# Patient Record
Sex: Male | Born: 1975 | Race: Black or African American | Hispanic: No | Marital: Married | Smoking: Current every day smoker
Health system: Southern US, Community
[De-identification: ages and names within clinical notes are randomized; demographics above are authoritative.]

## PROBLEM LIST (undated history)

## (undated) DIAGNOSIS — Z789 Other specified health status: Secondary | ICD-10-CM

## (undated) DIAGNOSIS — Z720 Tobacco use: Secondary | ICD-10-CM

## (undated) DIAGNOSIS — I517 Cardiomegaly: Secondary | ICD-10-CM

## (undated) DIAGNOSIS — I10 Essential (primary) hypertension: Secondary | ICD-10-CM

## (undated) DIAGNOSIS — Z9114 Patient's other noncompliance with medication regimen: Secondary | ICD-10-CM

## (undated) DIAGNOSIS — Z91148 Patient's other noncompliance with medication regimen for other reason: Secondary | ICD-10-CM

## (undated) DIAGNOSIS — I2510 Atherosclerotic heart disease of native coronary artery without angina pectoris: Secondary | ICD-10-CM

## (undated) DIAGNOSIS — E785 Hyperlipidemia, unspecified: Secondary | ICD-10-CM

## (undated) DIAGNOSIS — I25111 Atherosclerotic heart disease of native coronary artery with angina pectoris with documented spasm: Secondary | ICD-10-CM

## (undated) DIAGNOSIS — I214 Non-ST elevation (NSTEMI) myocardial infarction: Secondary | ICD-10-CM

## (undated) HISTORY — DX: Cardiomegaly: I51.7

## (undated) HISTORY — DX: Patient's other noncompliance with medication regimen: Z91.14

## (undated) HISTORY — DX: Other specified health status: Z78.9

## (undated) HISTORY — DX: Patient's other noncompliance with medication regimen for other reason: Z91.148

## (undated) HISTORY — DX: Tobacco use: Z72.0

## (undated) HISTORY — DX: Atherosclerotic heart disease of native coronary artery without angina pectoris: I25.10

## (undated) HISTORY — DX: Hyperlipidemia, unspecified: E78.5

## (undated) HISTORY — DX: Essential (primary) hypertension: I10

## (undated) SURGERY — LEFT HEART CATH AND CORONARY ANGIOGRAPHY

---

## 2013-11-04 ENCOUNTER — Emergency Department: Payer: Self-pay | Admitting: Emergency Medicine

## 2013-11-04 LAB — CBC
HCT: 48.9 % (ref 40.0–52.0)
HGB: 16.3 g/dL (ref 13.0–18.0)
MCH: 31.5 pg (ref 26.0–34.0)
MCHC: 33.3 g/dL (ref 32.0–36.0)
MCV: 95 fL (ref 80–100)
Platelet: 252 10*3/uL (ref 150–440)
RBC: 5.16 10*6/uL (ref 4.40–5.90)
RDW: 13.6 % (ref 11.5–14.5)
WBC: 5.7 10*3/uL (ref 3.8–10.6)

## 2013-11-04 LAB — BASIC METABOLIC PANEL
Anion Gap: 7 (ref 7–16)
BUN: 12 mg/dL (ref 7–18)
CALCIUM: 9.1 mg/dL (ref 8.5–10.1)
CHLORIDE: 103 mmol/L (ref 98–107)
Co2: 25 mmol/L (ref 21–32)
Creatinine: 1.13 mg/dL (ref 0.60–1.30)
EGFR (African American): >60
EGFR (Non-African Amer.): >60
Glucose: 90 mg/dL (ref 65–99)
OSMOLALITY: 269 (ref 275–301)
Potassium: 3.8 mmol/L (ref 3.5–5.1)
Sodium: 135 mmol/L — ABNORMAL LOW (ref 136–145)

## 2013-11-04 LAB — TROPONIN I: Troponin-I: <0.02 ng/mL

## 2014-11-25 ENCOUNTER — Encounter: Payer: Self-pay | Admitting: Family Medicine

## 2014-11-25 ENCOUNTER — Ambulatory Visit (INDEPENDENT_AMBULATORY_CARE_PROVIDER_SITE_OTHER): Payer: Self-pay | Admitting: Family Medicine

## 2014-11-25 VITALS — BP 176/112 | HR 112 | Temp 97.7°F | Resp 18 | Ht 71.5 in | Wt 274.5 lb

## 2014-11-25 DIAGNOSIS — IMO0001 Reserved for inherently not codable concepts without codable children: Secondary | ICD-10-CM

## 2014-11-25 DIAGNOSIS — I10 Essential (primary) hypertension: Secondary | ICD-10-CM

## 2014-11-25 DIAGNOSIS — Z131 Encounter for screening for diabetes mellitus: Secondary | ICD-10-CM

## 2014-11-25 DIAGNOSIS — E782 Mixed hyperlipidemia: Secondary | ICD-10-CM | POA: Insufficient documentation

## 2014-11-25 DIAGNOSIS — E785 Hyperlipidemia, unspecified: Secondary | ICD-10-CM

## 2014-11-25 MED ORDER — LOSARTAN POTASSIUM 100 MG PO TABS: 100.0000 mg | ORAL_TABLET | Freq: Every day | ORAL | Status: DC

## 2014-11-25 MED ORDER — ATORVASTATIN CALCIUM 20 MG PO TABS: 20.0000 mg | ORAL_TABLET | Freq: Every day | ORAL | Status: DC

## 2014-11-25 MED ORDER — HYDROCHLOROTHIAZIDE 25 MG PO TABS: 25.0000 mg | ORAL_TABLET | Freq: Every day | ORAL | Status: DC

## 2014-11-25 NOTE — Patient Instructions (Signed)
DASH Eating Plan °DASH stands for "Dietary Approaches to Stop Hypertension." The DASH eating plan is a healthy eating plan that has been shown to reduce high blood pressure (hypertension). Additional health benefits may include reducing the risk of type 2 diabetes mellitus, heart disease, and stroke. The DASH eating plan may also help with weight loss. °WHAT DO I NEED TO KNOW ABOUT THE DASH EATING PLAN? °For the DASH eating plan, you will follow these general guidelines: °· Choose foods with a percent daily value for sodium of less than 5% (as listed on the food label). °· Use salt-free seasonings or herbs instead of table salt or sea salt. °· Check with your health care provider or pharmacist before using salt substitutes. °· Eat lower-sodium products, often labeled as "lower sodium" or "no salt added." °· Eat fresh foods. °· Eat more vegetables, fruits, and low-fat dairy products. °· Choose whole grains. Look for the word "whole" as the first word in the ingredient list. °· Choose fish and skinless chicken or turkey more often than red meat. Limit fish, poultry, and meat to 6 oz (170 g) each day. °· Limit sweets, desserts, sugars, and sugary drinks. °· Choose heart-healthy fats. °· Limit cheese to 1 oz (28 g) per day. °· Eat more home-cooked food and less restaurant, buffet, and fast food. °· Limit fried foods. °· Cook foods using methods other than frying. °· Limit canned vegetables. If you do use them, rinse them well to decrease the sodium. °· When eating at a restaurant, ask that your food be prepared with less salt, or no salt if possible. °WHAT FOODS CAN I EAT? °Seek help from a dietitian for individual calorie needs. °Grains °Whole grain or whole wheat bread. Brown rice. Whole grain or whole wheat pasta. Quinoa, bulgur, and whole grain cereals. Low-sodium cereals. Corn or whole wheat flour tortillas. Whole grain cornbread. Whole grain crackers. Low-sodium crackers. °Vegetables °Fresh or frozen vegetables  (raw, steamed, roasted, or grilled). Low-sodium or reduced-sodium tomato and vegetable juices. Low-sodium or reduced-sodium tomato sauce and paste. Low-sodium or reduced-sodium canned vegetables.  °Fruits °All fresh, canned (in natural juice), or frozen fruits. °Meat and Other Protein Products °Ground beef (85% or leaner), grass-fed beef, or beef trimmed of fat. Skinless chicken or turkey. Ground chicken or turkey. Pork trimmed of fat. All fish and seafood. Eggs. Dried beans, peas, or lentils. Unsalted nuts and seeds. Unsalted canned beans. °Dairy °Low-fat dairy products, such as skim or 1% milk, 2% or reduced-fat cheeses, low-fat ricotta or cottage cheese, or plain low-fat yogurt. Low-sodium or reduced-sodium cheeses. °Fats and Oils °Tub margarines without trans fats. Light or reduced-fat mayonnaise and salad dressings (reduced sodium). Avocado. Safflower, olive, or canola oils. Natural peanut or almond butter. °Other °Unsalted popcorn and pretzels. °The items listed above may not be a complete list of recommended foods or beverages. Contact your dietitian for more options. °WHAT FOODS ARE NOT RECOMMENDED? °Grains °White bread. White pasta. White rice. Refined cornbread. Bagels and croissants. Crackers that contain trans fat. °Vegetables °Creamed or fried vegetables. Vegetables in a cheese sauce. Regular canned vegetables. Regular canned tomato sauce and paste. Regular tomato and vegetable juices. °Fruits °Dried fruits. Canned fruit in light or heavy syrup. Fruit juice. °Meat and Other Protein Products °Fatty cuts of meat. Ribs, chicken wings, bacon, sausage, bologna, salami, chitterlings, fatback, hot dogs, bratwurst, and packaged luncheon meats. Salted nuts and seeds. Canned beans with salt. °Dairy °Whole or 2% milk, cream, half-and-half, and cream cheese. Whole-fat or sweetened yogurt. Full-fat   cheeses or blue cheese. Nondairy creamers and whipped toppings. Processed cheese, cheese spreads, or cheese  curds. °Condiments °Onion and garlic salt, seasoned salt, table salt, and sea salt. Canned and packaged gravies. Worcestershire sauce. Tartar sauce. Barbecue sauce. Teriyaki sauce. Soy sauce, including reduced sodium. Steak sauce. Fish sauce. Oyster sauce. Cocktail sauce. Horseradish. Ketchup and mustard. Meat flavorings and tenderizers. Bouillon cubes. Hot sauce. Tabasco sauce. Marinades. Taco seasonings. Relishes. °Fats and Oils °Butter, stick margarine, lard, shortening, ghee, and bacon fat. Coconut, palm kernel, or palm oils. Regular salad dressings. °Other °Pickles and olives. Salted popcorn and pretzels. °The items listed above may not be a complete list of foods and beverages to avoid. Contact your dietitian for more information. °WHERE CAN I FIND MORE INFORMATION? °National Heart, Lung, and [employer] Institute: www.nhlbi.nih.gov/health/health-topics/topics/dash/ °Document Released: 03/28/2011 Document Revised: 08/23/2013 Document Reviewed: 02/10/2013 °ExitCare® Patient Information ©2015 [employer]. This information is not intended to replace advice given to you by your health care provider. Make sure you discuss any questions you have with your health care provider. ° °

## 2014-11-25 NOTE — Progress Notes (Signed)
Name: Jordan Maxwell   MRN: [id number]    DOB: 1975/10/21   Date:11/25/2014       Progress Note  Subjective  Chief Complaint  Chief Complaint  Patient presents with  . Establish Care    patient has not had any blood work in 2 months.  . Hypertension    patient has been without his medication for over a month  . Hyperlipidemia    HPI  Patient is here for routine follow up of Hypertension. First diagnosed with hypertension several years ago. Current anti-hypertension medication regimen includes dietary modification, weight management and supposed to be on Losartan 100mg  daily, HCTZ 25mg  daily and Simvastatin 20mg  daily but has been out of medication for over 1 month now.  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  He is not checking his blood pressures outside of physician office and notes no symptoms. Associated symptoms do not include headache, dizziness, nausea, lower extremity swelling, shortness of breath, chest pain, numbness.   Past Medical History  Diagnosis Date  . Hypertension   . Hyperlipidemia     History reviewed. No pertinent past surgical history.  Family History  Problem Relation Age of Onset  . Hypertension Mother   . Diabetes Mother     History   Social History  . Marital Status: Single    Spouse Name: N/A  . Number of Children: 4  . Years of Education: N/A   Occupational History  . Truck Education officer, community   Social History Main Topics  . Smoking status: Current Every Day Smoker    Types: Cigarettes  . Smokeless tobacco: Not on file  . Alcohol Use: 0.0 oz/week    0 Standard drinks or equivalent per week     Comment: ocassionally  . Drug Use: No  . Sexual Activity:    Partners: Female    Copy: None   Other Topics Concern  . Not on file   Social History Narrative  . No narrative on file    No current  outpatient prescriptions on file.  Allergies  Allergen Reactions  . Eggs Or Egg-Derived Products Anaphylaxis    Incident happened as a child     ROS  CONSTITUTIONAL: No significant weight changes, fever, chills, weakness or fatigue.  HEENT:  - Eyes: No visual changes.  - Ears: No auditory changes. No pain.  - Nose: No sneezing, congestion, runny nose. - Throat: No sore throat. No changes in swallowing. SKIN: No rash or itching.  CARDIOVASCULAR: No chest pain, chest pressure or chest discomfort. No palpitations or edema.  RESPIRATORY: No shortness of breath, cough or sputum.  GASTROINTESTINAL: No anorexia, nausea, vomiting. No changes in bowel habits. No abdominal pain or blood.  GENITOURINARY: No dysuria. No frequency. No discharge.  NEUROLOGICAL: No headache, dizziness, syncope, paralysis, ataxia, numbness or tingling in the extremities. No memory changes. No change in bowel or bladder control.  MUSCULOSKELETAL: No joint pain. No muscle pain. HEMATOLOGIC: No anemia, bleeding or bruising.  LYMPHATICS: No enlarged lymph nodes.  PSYCHIATRIC: No change in mood. No change in sleep pattern.  ENDOCRINOLOGIC: No reports of sweating, cold or heat intolerance. No polyuria or polydipsia.   Objective  Filed Vitals:   11/25/14 1336  BP: 176/112  Pulse: 112  Temp: 97.7 F (36.5 C)  TempSrc: Oral  Resp: 18  Height: 5' 11.5" (1.816 m)  Weight: 274 lb 8 oz (124.512 kg)  SpO2: 97%   Body mass index is 37.76 kg/(m^2).  Physical Exam  Constitutional: Patient is obese and well-nourished. In no distress.  HEENT:  - Head: Normocephalic and atraumatic.  - Ears: Bilateral TMs gray, no erythema or effusion - Nose: Nasal mucosa moist - Mouth/Throat: Oropharynx is clear and moist. No tonsillar hypertrophy or erythema. No post nasal drainage.  - Eyes: Conjunctival yellowing and vascular congestion, EOM movements normal. PERRLA. No scleral icterus.  Neck: Normal range of motion. Neck supple.  No JVD present. No thyromegaly present.  Cardiovascular: Normal rate, regular rhythm and normal heart sounds.  No murmur heard.  Pulmonary/Chest: Effort normal and breath sounds normal. No respiratory distress. Musculoskeletal: Normal range of motion bilateral UE and LE, no joint effusions. Peripheral vascular: Bilateral LE no edema. Neurological: CN II-XII grossly intact with no focal deficits. Alert and oriented to person, place, and time. Coordination, balance, strength, speech and gait are normal.  Skin: Skin is warm and dry. No rash noted. No erythema.  Psychiatric: Patient has a normal mood and affect. Behavior is normal in office today. Judgment and thought content normal in office today.   Assessment & Plan  1. Hypertension goal BP (blood pressure) < 140/90 The patient has been counseled on the importance of the proper and regular use of their medication(s) so as to achieve treatment goals. Patient verbalizes understanding that nonadhering to recommended treatment plan can result in adverse events.  Restarted him on his medications but instructed him to start with 1/2 tablet of Losartan  for few days then whole tablet then add on 1/2 tablet of HCTZ then whole tablet to avoid dropping his pressures too quickly. Last time he was out of medications and he was put back on them he had erectile dysfunction.   - CBC with Differential/Platelet - Comprehensive metabolic panel - Hemoglobin A1c - TSH - losartan (COZAAR) 100 MG tablet; Take 1 tablet (100 mg total) by mouth daily.  Dispense: 30 tablet; Refill: 5 - hydrochlorothiazide (HYDRODIURIL) 25 MG tablet; Take 1 tablet (25 mg total) by mouth daily.  Dispense: 30 tablet; Refill: 5  2. Hyperlipidemia LDL goal <100 Instructed patient to take his statin before bedtime. Switching him from simvastatin to atorvastatin.  - Hemoglobin A1c - Lipid panel - atorvastatin (LIPITOR) 20 MG tablet; Take 1 tablet (20 mg total) by mouth at bedtime.   Dispense: 30 tablet; Refill: 5  3. Obesity, Class II, BMI 35-39.9, with comorbidity Poor diet and lifestyle.  Comorbidities include HTN, HLD and possible hyperglycemia. Would benefit from weight loss.  - Hemoglobin A1c  4. Screening for diabetes mellitus (DM) At risk for DMII due to uncontroled HTN, HLD and Obesity, will check Hba1c.  - Hemoglobin A1c

## 2015-02-24 ENCOUNTER — Ambulatory Visit: Payer: Self-pay | Admitting: Family Medicine

## 2015-07-30 ENCOUNTER — Inpatient Hospital Stay
Admission: EM | Admit: 2015-07-30 | Discharge: 2015-08-01 | DRG: 247 | Disposition: A | Discharge status: Home / Self Care | Payer: Self-pay | Attending: Internal Medicine | Admitting: Internal Medicine

## 2015-07-30 ENCOUNTER — Encounter: Payer: Self-pay | Admitting: *Deleted

## 2015-07-30 ENCOUNTER — Inpatient Hospital Stay
Admit: 2015-07-30 | Discharge: 2015-07-30 | Disposition: A | Discharge status: Home / Self Care | Payer: Self-pay | Attending: Internal Medicine | Admitting: Internal Medicine

## 2015-07-30 ENCOUNTER — Emergency Department: Payer: Self-pay

## 2015-07-30 DIAGNOSIS — Z9114 Patient's other noncompliance with medication regimen: Secondary | ICD-10-CM

## 2015-07-30 DIAGNOSIS — E785 Hyperlipidemia, unspecified: Secondary | ICD-10-CM | POA: Diagnosis present

## 2015-07-30 DIAGNOSIS — F17200 Nicotine dependence, unspecified, uncomplicated: Secondary | ICD-10-CM | POA: Insufficient documentation

## 2015-07-30 DIAGNOSIS — I252 Old myocardial infarction: Secondary | ICD-10-CM | POA: Diagnosis present

## 2015-07-30 DIAGNOSIS — I10 Essential (primary) hypertension: Secondary | ICD-10-CM

## 2015-07-30 DIAGNOSIS — R7989 Other specified abnormal findings of blood chemistry: Secondary | ICD-10-CM

## 2015-07-30 DIAGNOSIS — I214 Non-ST elevation (NSTEMI) myocardial infarction: Principal | ICD-10-CM

## 2015-07-30 DIAGNOSIS — Z833 Family history of diabetes mellitus: Secondary | ICD-10-CM

## 2015-07-30 DIAGNOSIS — Z6836 Body mass index (BMI) 36.0-36.9, adult: Secondary | ICD-10-CM

## 2015-07-30 DIAGNOSIS — I2510 Atherosclerotic heart disease of native coronary artery without angina pectoris: Secondary | ICD-10-CM | POA: Insufficient documentation

## 2015-07-30 DIAGNOSIS — I25111 Atherosclerotic heart disease of native coronary artery with angina pectoris with documented spasm: Secondary | ICD-10-CM | POA: Diagnosis present

## 2015-07-30 DIAGNOSIS — E782 Mixed hyperlipidemia: Secondary | ICD-10-CM | POA: Diagnosis present

## 2015-07-30 DIAGNOSIS — E1169 Type 2 diabetes mellitus with other specified complication: Secondary | ICD-10-CM | POA: Diagnosis present

## 2015-07-30 DIAGNOSIS — F17210 Nicotine dependence, cigarettes, uncomplicated: Secondary | ICD-10-CM | POA: Diagnosis present

## 2015-07-30 DIAGNOSIS — Z8249 Family history of ischemic heart disease and other diseases of the circulatory system: Secondary | ICD-10-CM

## 2015-07-30 HISTORY — DX: Atherosclerotic heart disease of native coronary artery with angina pectoris with documented spasm: I25.111

## 2015-07-30 HISTORY — DX: Non-ST elevation (NSTEMI) myocardial infarction: I21.4

## 2015-07-30 LAB — COMPREHENSIVE METABOLIC PANEL
ALT: 22 U/L (ref 17–63)
ANION GAP: 7 (ref 5–15)
AST: 27 U/L (ref 15–41)
Albumin: 4 g/dL (ref 3.5–5.0)
Alkaline Phosphatase: 55 U/L (ref 38–126)
BUN: 14 mg/dL (ref 6–20)
CO2: 22 mmol/L (ref 22–32)
Calcium: 9 mg/dL (ref 8.9–10.3)
Chloride: 105 mmol/L (ref 101–111)
Creatinine, Ser: 1.09 mg/dL (ref 0.61–1.24)
GFR calc Af Amer: >60 mL/min (ref >60)
GFR calc non Af Amer: >60 mL/min (ref >60)
Glucose, Bld: 183 mg/dL — ABNORMAL HIGH (ref 65–99)
POTASSIUM: 4 mmol/L (ref 3.5–5.1)
SODIUM: 134 mmol/L — AB (ref 135–145)
Total Bilirubin: 0.6 mg/dL (ref 0.3–1.2)
Total Protein: 6.9 g/dL (ref 6.5–8.1)

## 2015-07-30 LAB — CBC
HCT: 49.4 % (ref 40.0–52.0)
HEMOGLOBIN: 16.8 g/dL (ref 13.0–18.0)
MCH: 31 pg (ref 26.0–34.0)
MCHC: 34 g/dL (ref 32.0–36.0)
MCV: 91.1 fL (ref 80.0–100.0)
Platelets: 235 10*3/uL (ref 150–440)
RBC: 5.42 MIL/uL (ref 4.40–5.90)
RDW: 13.9 % (ref 11.5–14.5)
WBC: 5.2 10*3/uL (ref 3.8–10.6)

## 2015-07-30 LAB — TROPONIN I
TROPONIN I: 0.22 ng/mL — AB (ref <0.031)
Troponin I: 0.15 ng/mL — ABNORMAL HIGH (ref <0.031)
Troponin I: 0.27 ng/mL — ABNORMAL HIGH (ref <0.031)

## 2015-07-30 LAB — HEMOGLOBIN A1C: HEMOGLOBIN A1C: 5.9 % (ref 4.0–6.0)

## 2015-07-30 LAB — URINE DRUG SCREEN, QUALITATIVE (ARMC ONLY)
Amphetamines, Ur Screen: NOT DETECTED
BARBITURATES, UR SCREEN: NOT DETECTED
BENZODIAZEPINE, UR SCRN: NOT DETECTED
Cannabinoid 50 Ng, Ur ~~LOC~~: NOT DETECTED
Cocaine Metabolite,Ur ~~LOC~~: NOT DETECTED
MDMA (Ecstasy)Ur Screen: NOT DETECTED
METHADONE SCREEN, URINE: NOT DETECTED
Opiate, Ur Screen: NOT DETECTED
Phencyclidine (PCP) Ur S: NOT DETECTED
TRICYCLIC, UR SCREEN: NOT DETECTED

## 2015-07-30 LAB — PROTIME-INR
INR: 0.94
PROTHROMBIN TIME: 12.8 s (ref 11.4–15.0)

## 2015-07-30 LAB — APTT: APTT: 28 s (ref 24–36)

## 2015-07-30 LAB — HEPARIN LEVEL (UNFRACTIONATED): HEPARIN UNFRACTIONATED: 0.28 [IU]/mL — AB (ref 0.30–0.70)

## 2015-07-30 MED ORDER — ONDANSETRON HCL 4 MG PO TABS: 4.0000 mg | ORAL_TABLET | Freq: Four times a day (QID) | ORAL | Status: DC | PRN

## 2015-07-30 MED ORDER — METOPROLOL TARTRATE 25 MG PO TABS
12.5000 mg | ORAL_TABLET | Freq: Two times a day (BID) | ORAL | Status: DC
  Administered 2015-07-30 – 2015-07-31 (×3): 12.5 mg via ORAL
  Filled 2015-07-30 (×3): qty 1

## 2015-07-30 MED ORDER — ASPIRIN 81 MG PO CHEW
324.0000 mg | CHEWABLE_TABLET | Freq: Once | ORAL | Status: AC
  Administered 2015-07-30: 324 mg via ORAL
  Filled 2015-07-30: qty 4

## 2015-07-30 MED ORDER — CLOPIDOGREL BISULFATE 75 MG PO TABS
75.0000 mg | ORAL_TABLET | Freq: Every day | ORAL | Status: DC
  Administered 2015-07-31: 75 mg via ORAL
  Filled 2015-07-30: qty 1

## 2015-07-30 MED ORDER — CLOPIDOGREL BISULFATE 75 MG PO TABS
300.0000 mg | ORAL_TABLET | Freq: Once | ORAL | Status: AC
  Administered 2015-07-30: 300 mg via ORAL
  Filled 2015-07-30: qty 4

## 2015-07-30 MED ORDER — HEPARIN BOLUS VIA INFUSION
1550.0000 [IU] | Freq: Once | INTRAVENOUS | Status: AC
  Administered 2015-07-30: 1550 [IU] via INTRAVENOUS
  Filled 2015-07-30: qty 1550

## 2015-07-30 MED ORDER — SODIUM CHLORIDE 0.9% FLUSH
3.0000 mL | Freq: Two times a day (BID) | INTRAVENOUS | Status: DC
  Administered 2015-07-30 – 2015-07-31 (×3): 3 mL via INTRAVENOUS

## 2015-07-30 MED ORDER — ONDANSETRON HCL 4 MG/2ML IJ SOLN: 4.0000 mg | Freq: Four times a day (QID) | INTRAMUSCULAR | Status: DC | PRN

## 2015-07-30 MED ORDER — OXYCODONE HCL 5 MG PO TABS: 5.0000 mg | ORAL_TABLET | ORAL | Status: DC | PRN

## 2015-07-30 MED ORDER — ASPIRIN EC 81 MG PO TBEC
81.0000 mg | DELAYED_RELEASE_TABLET | Freq: Every day | ORAL | Status: DC
  Administered 2015-07-31 – 2015-08-01 (×2): 81 mg via ORAL
  Filled 2015-07-30 (×2): qty 1

## 2015-07-30 MED ORDER — AMLODIPINE BESYLATE 5 MG PO TABS
5.0000 mg | ORAL_TABLET | Freq: Every day | ORAL | Status: DC
  Administered 2015-07-30 – 2015-07-31 (×2): 5 mg via ORAL
  Filled 2015-07-30 (×2): qty 1

## 2015-07-30 MED ORDER — NITROGLYCERIN 0.4 MG SL SUBL: 0.4000 mg | SUBLINGUAL_TABLET | SUBLINGUAL | Status: DC | PRN

## 2015-07-30 MED ORDER — ACETAMINOPHEN 650 MG RE SUPP: 650.0000 mg | Freq: Four times a day (QID) | RECTAL | Status: DC | PRN

## 2015-07-30 MED ORDER — HEPARIN (PORCINE) IN NACL 100-0.45 UNIT/ML-% IJ SOLN
1500.0000 [IU]/h | INTRAMUSCULAR | Status: DC
  Administered 2015-07-30: 1000 [IU]/h via INTRAVENOUS
  Administered 2015-07-31: 1500 [IU]/h via INTRAVENOUS
  Filled 2015-07-30 (×5): qty 250

## 2015-07-30 MED ORDER — LABETALOL HCL 5 MG/ML IV SOLN
10.0000 mg | Freq: Once | INTRAVENOUS | Status: AC
  Administered 2015-07-30: 10 mg via INTRAVENOUS
  Filled 2015-07-30: qty 4

## 2015-07-30 MED ORDER — ACETAMINOPHEN 325 MG PO TABS: 650.0000 mg | ORAL_TABLET | Freq: Four times a day (QID) | ORAL | Status: DC | PRN

## 2015-07-30 MED ORDER — HYDRALAZINE HCL 20 MG/ML IJ SOLN
10.0000 mg | INTRAMUSCULAR | Status: DC | PRN
  Administered 2015-07-30: 10 mg via INTRAVENOUS
  Filled 2015-07-30: qty 1

## 2015-07-30 MED ORDER — MORPHINE SULFATE (PF) 2 MG/ML IV SOLN: 2.0000 mg | INTRAVENOUS | Status: DC | PRN

## 2015-07-30 MED ORDER — ATORVASTATIN CALCIUM 20 MG PO TABS
80.0000 mg | ORAL_TABLET | Freq: Every day | ORAL | Status: DC
  Administered 2015-07-30 – 2015-07-31 (×2): 80 mg via ORAL
  Filled 2015-07-30 (×2): qty 4

## 2015-07-30 MED ORDER — HEPARIN BOLUS VIA INFUSION
4000.0000 [IU] | Freq: Once | INTRAVENOUS | Status: AC
  Administered 2015-07-30: 4000 [IU] via INTRAVENOUS
  Filled 2015-07-30: qty 4000

## 2015-07-30 NOTE — ED Notes (Signed)
ED MD and admiting MD at bedside. Educating pt on importance of staying. Pt agreed to stay in hospital.

## 2015-07-30 NOTE — ED Notes (Signed)
Pt called RN into room and stated that he wants to leave. When asked why pt reports he does not feel like he needs to be there. RN educated pt on benefits of staying and dangers of leaving with his elevated troponin and possible heart attack. MD notified.

## 2015-07-30 NOTE — Progress Notes (Signed)
*  PRELIMINARY RESULTS* Echocardiogram 2D Echocardiogram has been performed.  Garrel Ridgelikeshia S Stills 07/30/2015, 4:03 PM

## 2015-07-30 NOTE — ED Notes (Signed)
Pt arrives with complaints of HTN, states he has not had BP meds in about a month, states some chest burning, awake and alert in no acute distress

## 2015-07-30 NOTE — Consult Note (Addendum)
ANTICOAGULATION CONSULT NOTE - Initial Consult  Pharmacy Consult for heparin infusion  Indication: chest pain/ACS  Allergies  Allergen Reactions  . Eggs Or Egg-Derived Products Anaphylaxis    Incident happened as a child    Patient Measurements: Height: 6' (182.9 cm) Weight: 271 lb (122.925 kg) IBW/kg (Calculated) : 77.6 Heparin Dosing Weight: 104 kg  Vital Signs: Temp: 97.6 F (36.4 C) (04/09 0720) Temp Source: Oral (04/09 0720) BP: 145/98 mmHg (04/09 0858) Pulse Rate: 92 (04/09 0858)  Labs:  Recent Labs  07/30/15 0735  HGB 16.8  HCT 49.4  PLT 235  CREATININE 1.09  TROPONINI 0.15*    Estimated Creatinine Clearance: 120.7 mL/min (by C-G formula based on Cr of 1.09).   Medical History: Past Medical History  Diagnosis Date  . Hypertension   . Hyperlipidemia    Assessment: 40 yo male with PMH of chronic SOB, HLD and HTN, presented to ED with cough and burning in his chest.  Pharmacy has been consulted for heparin dosing and monitoring for ACS/chest pain.    Goal of Therapy:  Heparin level 0.3-0.7 units/ml Monitor platelets by anticoagulation protocol: Yes   Plan:  Give 4000 unit bolus Followed by infusion of 1300 units/hr PT/INR and APTT pending  HL ordered for 1600 today. Pharmacy will continue to monitor and adjust per consult.   Cher NakaiSheema Lora Glomski, PharmD Pharmacy Resident  07/30/2015,9:21 AM

## 2015-07-30 NOTE — H&P (Signed)
[facility] [place] [facility]   PATIENT NAME: Jordan Maxwell    MR#:  [id number]  DATE OF BIRTH:  12-22-1975   DATE OF ADMISSION:  07/30/2015  PRIMARY CARE PHYSICIAN: No PCP Per Patient   REQUESTING/REFERRING PHYSICIAN: Kinner  CHIEF COMPLAINT:  Chest pain   HISTORY OF PRESENT ILLNESS:  Jordan Lerichentonio Helser  is a 40 y.o. male with a known history of Essential hypertension not compliant with medications presented with chest pain. Described episode of chest pain last night retrosternal location burning in quality 6/10 intensity went to bed and woke up in the morning and repeat symptoms. Given ongoing symptoms settled presented to Hospital further workup and evaluation. On arrival noted to have elevated troponin. Emergency department staff discussed case with cardiology Patient currently chest pain-free though anxious  PAST MEDICAL HISTORY:   Past Medical History  Diagnosis Date  . Hypertension   . Hyperlipidemia   . NSTEMI (non-ST elevated myocardial infarction) (HCC) 07/30/2015    PAST SURGICAL HISTORY:  History reviewed. No pertinent past surgical history.  SOCIAL HISTORY:   Social History  Substance Use Topics  . Smoking status: Current Every Day Smoker    Types: Cigarettes  . Smokeless tobacco: Not on file  . Alcohol Use: 0.0 oz/week    0 Standard drinks or equivalent per week     Comment: ocassionally    FAMILY HISTORY:   Family History  Problem Relation Age of Onset  . Hypertension Mother   . Diabetes Mother     DRUG ALLERGIES:   Allergies  Allergen Reactions  . Eggs Or Egg-Derived Products Anaphylaxis    Incident happened as a child    REVIEW OF SYSTEMS:  REVIEW OF SYSTEMS:  CONSTITUTIONAL: Denies fevers, chills, fatigue, weakness.  EYES: Denies blurred vision, double vision, or eye pain.  EARS, NOSE, THROAT: Denies tinnitus, ear pain, hearing loss.  RESPIRATORY: denies cough, shortness of breath, wheezing  CARDIOVASCULAR:  Positive chest pain, denies palpitations, edema.  GASTROINTESTINAL: Denies nausea, vomiting, diarrhea, abdominal pain.  GENITOURINARY: Denies dysuria, hematuria.  ENDOCRINE: Denies nocturia or thyroid problems. HEMATOLOGIC AND LYMPHATIC: Denies easy bruising or bleeding.  SKIN: Denies rash or lesions.  MUSCULOSKELETAL: Denies pain in neck, back, shoulder, knees, hips, or further arthritic symptoms.  NEUROLOGIC: Denies paralysis, paresthesias.  PSYCHIATRIC: Positive anxiety denies depressive symptoms. Otherwise full review of systems performed by me is negative.   MEDICATIONS AT HOME:   Prior to Admission medications   Medication Sig Start Date End Date Taking? Authorizing Provider  atorvastatin (LIPITOR) 20 MG tablet Take 20 mg by mouth daily.    Historical Provider, MD  losartan (COZAAR) 100 MG tablet Take 1 tablet (100 mg total) by mouth daily. Patient not taking: Reported on 07/30/2015 11/25/14   Edwena FeltyAshany Sundaram, MD      VITAL SIGNS:  Blood pressure 131/93, pulse 86, temperature 97.6 F (36.4 C), temperature source Oral, resp. rate 14, height 6' (1.829 m), weight 122.925 kg (271 lb), SpO2 98 %.  PHYSICAL EXAMINATION:  VITAL SIGNS: Filed Vitals:   07/30/15 0858 07/30/15 0930  BP: 145/98 131/93  Pulse: 92 86  Temp:    Resp: 20 14   GENERAL:39 y.o.male currently in no acute distress.  HEAD: Normocephalic, atraumatic.  EYES: Pupils equal, round, reactive to light. Extraocular muscles intact. No scleral icterus.  MOUTH: Moist mucosal membrane. Dentition intact. No abscess noted.  EAR, NOSE, THROAT: Clear without exudates. No external lesions.  NECK: Supple. No thyromegaly. No nodules. No JVD.  PULMONARY: Clear to ascultation, without wheeze rails or rhonci. No use of accessory muscles, Good respiratory effort. good air entry bilaterally CHEST: Nontender to palpation.  CARDIOVASCULAR: S1 and S2. Regular rate and rhythm. No murmurs, rubs, or gallops. No edema. Pedal pulses 2+  bilaterally.  GASTROINTESTINAL: Soft, nontender, nondistended. No masses. Positive bowel sounds. No hepatosplenomegaly.  MUSCULOSKELETAL: No swelling, clubbing, or edema. Range of motion full in all extremities.  NEUROLOGIC: Cranial nerves II through XII are intact. No gross focal neurological deficits. Sensation intact. Reflexes intact.  SKIN: No ulceration, lesions, rashes, or cyanosis. Skin warm and dry. Turgor intact.  PSYCHIATRIC: Mood, affect Anxious and tearful. The patient is awake, alert and oriented x 3. Insight, judgment intact.    LABORATORY PANEL:   CBC  Recent Labs Lab 07/30/15 0735  WBC 5.2  HGB 16.8  HCT 49.4  PLT 235   ------------------------------------------------------------------------------------------------------------------  Chemistries   Recent Labs Lab 07/30/15 0735  NA 134*  K 4.0  CL 105  CO2 22  GLUCOSE 183*  BUN 14  CREATININE 1.09  CALCIUM 9.0  AST 27  ALT 22  ALKPHOS 55  BILITOT 0.6   ------------------------------------------------------------------------------------------------------------------  Cardiac Enzymes  Recent Labs Lab 07/30/15 0735  TROPONINI 0.15*   ------------------------------------------------------------------------------------------------------------------  RADIOLOGY:  Dg Chest 2 View  07/30/2015  CLINICAL DATA:  Chest burning for a few minutes this morning. History of hypertension. Coughing. EXAM: CHEST  2 VIEW COMPARISON:  11/04/2013 FINDINGS: Lung markings are slightly more prominent compared to the previous examination. There is no evidence for overt pulmonary edema or focal airspace disease. No large pleural effusions. Densities along the anterior chest on the lateral view are more prominent than the previous examination. This may be related to pericardial fat. Trachea is midline. Heart and mediastinum are within normal limits. No suspicious bone findings. IMPRESSION: Slightly prominent lung markings without  focal airspace disease or pulmonary edema. Overall, no acute chest findings. Density along the anterior chest on the lateral view is probably related to pericardial fat. Electronically Signed   By: Richarda Overlie M.D.   On: 07/30/2015 08:04    EKG:   Orders placed or performed during the hospital encounter of 07/30/15  . EKG 12-Lead  . EKG 12-Lead  . EKG 12-Lead  . EKG 12-Lead    IMPRESSION AND PLAN:   40 year old African-American with history of essential hypertension he presented with chest pain.  1. NSTEMI: Heparin drip, Initiate aspirin and statin therapy, admitted to telemetry, trend cardiac enzymes 3, ,nitroglycerin when necessary, morphine when necessary, consult cardiology [facility] 2. Essential hypertension: We'll start beta blockade will hold his other medication as he has not been taking it 3. Venous thromboembolism prophylactic: Therapeutic heparin    All the records are reviewed and case discussed with ED provider. Management plans discussed with the patient, family and they are in agreement.  CODE STATUS: Full  TOTAL TIME TAKING CARE OF THIS PATIENT: 33 minutes.    Hower,  Mardi Mainland.D on 07/30/2015 at 11:07 AM  Between 7am to 6pm - Pager - [phone]  After 6pm: House Pager: - [phone]  [facility] [place] [facility]  Office  [phone]  CC: Primary care physician; No PCP Per Patient

## 2015-07-30 NOTE — ED Provider Notes (Addendum)
[facility] Emergency Department Provider Note  ____________________________________________    I have reviewed the triage vital signs and the nursing notes.   HISTORY  Chief Complaint Hypertension    HPI Jordan Maxwell is a 40 y.o. male who presents with complaints of coughing episode this morning and high blood pressure because he has not had his medications in about a month. Patient reports he typically takes losartanbut ran out approximately one month ago. He notes he woke up this morning drank some orange juice and had a coughing episode and felt a slight burning in his chest which lasted a few seconds. This motivated him to come to the emergency department. He reports chronic shortness of breath. He does smoke cigarettes. He has no chest pain now. No recent travel. No calf pain or swelling in the legs.     Past Medical History  Diagnosis Date  . Hypertension   . Hyperlipidemia     Patient Active Problem List   Diagnosis Date Noted  . Hypertension goal BP (blood pressure) < 140/90 11/25/2014  . Hyperlipidemia LDL goal <100 11/25/2014  . Obesity, Class II, BMI 35-39.9, with comorbidity (HCC) 11/25/2014  . Screening for diabetes mellitus (DM) 11/25/2014    History reviewed. No pertinent past surgical history.  Current Outpatient Rx  Name  Route  Sig  Dispense  Refill  . atorvastatin (LIPITOR) 20 MG tablet   Oral   Take 20 mg by mouth daily.         Marland Kitchen losartan (COZAAR) 100 MG tablet   Oral   Take 1 tablet (100 mg total) by mouth daily. Patient not taking: Reported on 07/30/2015   30 tablet   5     Allergies Eggs or egg-derived products  Family History  Problem Relation Age of Onset  . Hypertension Mother   . Diabetes Mother     Social History Social History  Substance Use Topics  . Smoking status: Current Every Day Smoker    Types: Cigarettes  . Smokeless tobacco: None  . Alcohol Use: 0.0 oz/week    0 Standard drinks or  equivalent per week     Comment: ocassionally    Review of Systems  Constitutional: Negative for fever. Eyes: Negative for redness ENT: Negative for sore throat Cardiovascular: Negative for chest pain Respiratory: As above Gastrointestinal: Negative for abdominal pain Genitourinary: Negative for dysuria. Musculoskeletal: Negative for back pain. Skin: Negative for rash. Neurological: Negative for focal weakness Psychiatric: no anxiety    ____________________________________________   PHYSICAL EXAM:  VITAL SIGNS: ED Triage Vitals  Enc Vitals Group     BP 07/30/15 0720 190/105 mmHg     Pulse Rate 07/30/15 0720 101     Resp 07/30/15 0720 18     Temp 07/30/15 0720 97.6 F (36.4 C)     Temp Source 07/30/15 0720 Oral     SpO2 07/30/15 0720 100 %     Weight 07/30/15 0720 271 lb (122.925 kg)     Height 07/30/15 0720 6' (1.829 m)     Head Cir --      Peak Flow --      Pain Score 07/30/15 0720 0     Pain Loc --      Pain Edu? --      Excl. in GC? --     Constitutional: Alert and oriented. Well appearing and in no distress.  Eyes: Conjunctivae are normal. No erythema or injection ENT   Head: Normocephalic and atraumatic.  Mouth/Throat: Mucous membranes are moist. Cardiovascular: Normal rate, regular rhythm. Normal and symmetric distal pulses are present in the upper extremities. No murmurs or rubs  Respiratory: Normal respiratory effort without tachypnea nor retractions. Breath sounds are clear and equal bilaterally.   Genitourinary: deferred Musculoskeletal: Nontender with normal range of motion in all extremities. No lower extremity tenderness nor edema. Neurologic:  Normal speech and language. No gross focal neurologic deficits are appreciated. Skin:  Skin is warm, dry and intact. No rash noted. Psychiatric: Mood and affect are normal. Patient exhibits appropriate insight and judgment.  ____________________________________________    LABS (pertinent  positives/negatives)  Labs Reviewed  COMPREHENSIVE METABOLIC PANEL - Abnormal; Notable for the following:    Sodium 134 (*)    Glucose, Bld 183 (*)    All other components within normal limits  TROPONIN I - Abnormal; Notable for the following:    Troponin I 0.15 (*)    All other components within normal limits  CBC  PROTIME-INR  APTT  HEPARIN LEVEL (UNFRACTIONATED)    ____________________________________________   EKG  ED ECG REPORT I, Jene EveryKINNER, Abigal Choung, the attending physician, personally viewed and interpreted this ECG.  Date: 07/30/2015 EKG Time: 7:24 AM Rate: 98 Rhythm: normal sinus rhythm QRS Axis: normal Intervals: normal ST/T Wave abnormalities: normal Conduction Disturbances: none Narrative Interpretation: unremarkable  EKG #2 ED ECG REPORT I, Jene EveryKINNER, Dmarcus Decicco, the attending physician, personally viewed and interpreted this ECG.  Date: 07/30/2015 EKG Time: 8:44 AM Rate: 88 Rhythm: normal sinus rhythm QRS Axis: normal Intervals: normal ST/T Wave abnormalities: Nonspecific Conduction Disturbances: none Narrative Interpretation: Nonspecific, will recheck    ____________________________________________    RADIOLOGY  Chest x-ray no acute distress  ____________________________________________   PROCEDURES  Procedure(s) performed: none  Critical Care performed: yes  CRITICAL CARE Performed by: Jene EveryKINNER, Ezriel Boffa   Total critical care time: 30 minutes  Critical care time was exclusive of separately billable procedures and treating other patients.  Critical care was necessary to treat or prevent imminent or life-threatening deterioration.  Critical care was time spent personally by me on the following activities: development of treatment plan with patient and/or surrogate as well as nursing, discussions with consultants, evaluation of patient's response to treatment, examination of patient, obtaining history from patient or surrogate, ordering and  performing treatments and interventions, ordering and review of laboratory studies, ordering and review of radiographic studies, pulse oximetry and re-evaluation of patient's condition.   ____________________________________________   INITIAL IMPRESSION / ASSESSMENT AND PLAN / ED COURSE  Pertinent labs & imaging results that were available during my care of the patient were reviewed by me and considered in my medical decision making (see chart for details).  Patient overall well-appearing. His blood pressure is elevated but otherwise his exam is benign. His EKG is reassuring. We will check labs, x-ray and reevaluate.  Troponin is elevated  EKG rechecked no evidence of STEMI. Aspirin given, heparin ordered  ----------------------------------------- 9:07 AM on 07/30/2015 -----------------------------------------  Third EKG shows extremely minimal elevation in inferior leads, discussed with Dr. Elberta Fortisamnitz of cardiology who will study the EKG and call us back  Dr. Elberta Fortisamnitz feels the patient is appropriate for admission to medicine on heparin given the patient is chest pain-free in the emergency department. He will see the patient in the hospital  ----------------------------------------- 10:46 AM on 07/30/2015 -----------------------------------------  Patient wants to leave. I discussed with him at length the numerous reasons why that would be a very bad idea including worsening condition, death, disability. I explained to  him that I believe he is having a heart attack and he is aware of this. I attempted to convince him to stay but he is adamant that he wants to leave. He has decisional capacity.   Dr. Clint Guy and i were eventually able to convince the patient to stay thankfully ____________________________________________   FINAL CLINICAL IMPRESSION(S) / ED DIAGNOSES  Final diagnoses:  NSTEMI (non-ST elevated myocardial infarction) [facility])          Jene Every, MD 07/30/15  0930  Jene Every, MD 07/30/15 1236

## 2015-07-30 NOTE — Consult Note (Signed)
 Cardiology Consultation Note  Patient ID: Jordan Maxwell, MRN: [id number], DOB/AGE: 09/22/1975 40 y.o. Admit date: 07/30/2015   Date of Consult: 07/30/2015 Primary Physician: No PCP Per Patient Primary Cardiologist: New to [facility] Time of consult: 1300 Requesting Physician: Dr. Hower, MD  Chief Complaint: Chest pain Reason for Consult: NSTEMI  HPI: 40 y.o. male with h/o poorly controlled HTN, HLD, ongoing tobacco abuse, and medication noncompliance who presented to [facility] with chest pain this morning and was found to have an elevated troponin of in the setting of accelerated HTN with BP of 190/105.  He has no previously known cardiac history and has never seen a cardiologist before. 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 He usually gets his blood pressure under control just prior to needing to get his DOT license renewed. After he gets the license renewed he reports going back to eating a poor diet and having medication noncompliance. He has previously been advised to see a cardiologist given his poorly controlled blood pressure for risk stratification, however he did not follow through with this plan. He does not check his blood pressure at home. He has smoked tobacco since age 12 years at 2.5 packs to 1.5 packs daily and continues to smoke to date. He rarely drinks ETOH on the weeks, however when he does he Adaisha Campise drink 1/2 a fifth at a time. He previously smoked marijuana, though denies any other illegal drugs. He has never had chest pain before and has not noticed any changes in his functional capacity. He work up this morning to a 30 second episode of what he describes as chest discomfort/reflux. He denies this being chest pain. He drank some juice and the symptoms went away. There was some associated nausea and SOB. He has been symptom free since.   Upon the patient's arrival to [facility] they were found to have an troponin of 0.15. Cardiology has ordered further troponin levels upon being  consulted. ECG showed NSR, 98 bpm, nonspecific inferolateral st/t changes. 8:44 AM - NSR 88 bpm, nonspecific inferolateral st elevation without reciprocal changes. Cardiology has ordered stat repeat EKG based on EKG changes above, which showed NSR, 96 bpm, improved inferolateral st/t changes. CXR showed increased lung markings without focal airspace disease or pulmonary edema. He was started on a heparin gtt. He is chest pain free currently.   Past Medical History  Diagnosis Date  . Hypertension   . Hyperlipidemia   . NSTEMI (non-ST elevated myocardial infarction) (HCC) 07/30/2015      Most Recent Cardiac Studies: None.   Surgical History: History reviewed. No pertinent past surgical history.   Home Meds: Prior to Admission medications   Medication Sig Start Date End Date Taking? Authorizing Provider  atorvastatin (LIPITOR) 20 MG tablet Take 20 mg by mouth daily.    Historical Provider, MD  losartan (COZAAR) 100 MG tablet Take 1 tablet (100 mg total) by mouth daily. Patient not taking: Reported on 07/30/2015 11/25/14   Ashany Sundaram, MD    Inpatient Medications:  . aspirin EC  81 mg Oral Daily  . atorvastatin  80 mg Oral q1800  . metoprolol tartrate  12.5 mg Oral BID  . sodium chloride flush  3 mL Intravenous Q12H   . heparin 1,300 Units/hr (07/30/15 0935)    Allergies:  Allergies  Allergen Reactions  . Eggs Or Egg-Derived Products Anaphylaxis    Incident happened as a child    Social History   Social History  . Marital Status:   Single    Spouse Name: N/A  . Number of Children: 4  . Years of Education: N/A   Occupational History  . Truck Driver     [employer]   Social History Main Topics  . Smoking status: Current Every Day Smoker -- 1.50 packs/day for 26 years    Types: Cigarettes  . Smokeless tobacco: Not on file  . Alcohol Use: 0.0 oz/week    0 Standard drinks or equivalent per week     Comment: ocassionally  . Drug Use: No  . Sexual Activity:    Partners:  Female    Birth Control/ Protection: None   Other Topics Concern  . Not on file   Social History Narrative     Family History  Problem Relation Age of Onset  . Hypertension Mother   . Diabetes Mother      Review of Systems: Review of Systems  Constitutional: Positive for malaise/fatigue. Negative for fever, chills, weight loss and diaphoresis.  HENT: Negative for congestion.   Eyes: Negative for discharge and redness.  Respiratory: Positive for shortness of breath. Negative for cough and wheezing.   Cardiovascular: Positive for chest pain. Negative for palpitations, orthopnea, claudication, leg swelling and PND.  Gastrointestinal: Positive for nausea. Negative for heartburn, vomiting, abdominal pain, blood in stool and melena.  Musculoskeletal: Negative for myalgias and falls.  Skin: Negative for rash.  Neurological: Positive for weakness. Negative for dizziness, tingling, tremors, sensory change, speech change, focal weakness and loss of consciousness.  Endo/Heme/Allergies: Does not bruise/bleed easily.  Psychiatric/Behavioral: Positive for substance abuse. The patient is not nervous/anxious.        Ongoing tobacco abuse, prior marijuana abuse, rare ETOH abuse   All other systems reviewed and are negative.   Labs:  Recent Labs  07/30/15 0735  TROPONINI 0.15*   Lab Results  Component Value Date   WBC 5.2 07/30/2015   HGB 16.8 07/30/2015   HCT 49.4 07/30/2015   MCV 91.1 07/30/2015   PLT 235 07/30/2015    Recent Labs Lab 07/30/15 0735  NA 134*  K 4.0  CL 105  CO2 22  BUN 14  CREATININE 1.09  CALCIUM 9.0  PROT 6.9  BILITOT 0.6  ALKPHOS 55  ALT 22  AST 27  GLUCOSE 183*   No results found for: CHOL, HDL, LDLCALC, TRIG No results found for: DDIMER  Radiology/Studies:  Dg Chest 2 View  07/30/2015  CLINICAL DATA:  Chest burning for a few minutes this morning. History of hypertension. Coughing. EXAM: CHEST  2 VIEW COMPARISON:  11/04/2013 FINDINGS: Lung  markings are slightly more prominent compared to the previous examination. There is no evidence for overt pulmonary edema or focal airspace disease. No large pleural effusions. Densities along the anterior chest on the lateral view are more prominent than the previous examination. This may be related to pericardial fat. Trachea is midline. Heart and mediastinum are within normal limits. No suspicious bone findings. IMPRESSION: Slightly prominent lung markings without focal airspace disease or pulmonary edema. Overall, no acute chest findings. Density along the anterior chest on the lateral view is probably related to pericardial fat. Electronically Signed   By: Adam  Henn M.D.   On: 07/30/2015 08:04    EKG: 7:24 AM - NSR, 98 bpm, nonspecific inferolateral st/t changes. 8:44 AM - NSR 88 bpm, nonspecific inferolateral st elevation without reciprocal changes   Weights: Filed Weights   07/30/15 0720 07/30/15 1254  Weight: 271 lb (122.925 kg) 272 lb 6.4 oz (  123.56 kg)     Physical Exam: Blood pressure 163/95, pulse 93, temperature 97.8 F (36.6 C), temperature source Oral, resp. rate 20, height 6' (1.829 m), weight 272 lb 6.4 oz (123.56 kg), SpO2 100 %. Body mass index is 36.94 kg/(m^2). General: Well developed, well nourished, in no acute distress. Head: Normocephalic, atraumatic, sclera non-icteric, no xanthomas, nares are without discharge.  Neck: Negative for carotid bruits. JVD not elevated. Lungs: Clear bilaterally to auscultation without wheezes, rales, or rhonchi. Breathing is unlabored. Heart: RRR with S1 S2. No murmurs, rubs, or gallops appreciated. Abdomen: Obese, soft, non-tender, non-distended with normoactive bowel sounds. No hepatomegaly. No rebound/guarding. No obvious abdominal masses. Msk:  Strength and tone appear normal for age. Extremities: No clubbing or cyanosis. No edema.  Distal pedal pulses are 2+ and equal bilaterally. Neuro: Alert and oriented X 3. No facial asymmetry.  No focal deficit. Moves all extremities spontaneously. Psych:  Responds to questions appropriately with a normal affect.    Assessment and Plan:   1. Elevated troponin: -Patient's symptoms persisted for 30 seconds by his account -Initial troponin 0.15 at 7:35 AM, stat troponin ordered at time of cardiology consult to trend -Currently on heparin gtt -If troponin trends upwards would continue heparin gtt and plan for cardiac cath on 4/10 with Dr. Arida, MD -If troponin is flat /down trending could possibly be in the setting of supply demand ischemia given the patient's accelerated hypertension  -Check urine drug screen -Lipid and A1c for risk stratification  -Check echo to evaluate LV systolic function, wall motion, and right-sided pressure  2. Accelerated HTN: -As above -Continues to run in the 160's systolic -He was started on Lopressor 12.5 mg bid -Would continue to hold losartan in case he needs cardiac cath 4/10 -Add Norvasc 5 mg daily, titrate to 10 as needed -Could add prn hydralazine   3. Polysubstance abuse: -Check urine drug screen -Tobacco cessation advised  4. Obesity: -Weight loss advised  Signed, Ryan Dunn, PA-C Pager: [phone] 07/30/2015, 1:26 PM  I have seen and examined this patient with Ryan Dunn.  Agree with above, note added to reflect my findings.  On exam, regular rhythm, no murmurs, lungs clear.  Presented to the hospital with stuttering chest pain and found to have a troponin of 0.15 this AM.  Second troponin just drawn.  Cyan Moultrie continue to trend to at least 3 troponins.  If his troponins remain flat, Rianna Lukes potentially do stress testing.  If they trend up, Kahley Leib possibly need cardiac cath.  His BP is also quite elevated.  Started on norvasc.  He is not currently insured, Lean Fayson have case management work with him on finding affordable medications.  His meds may need to be on the $4 list for him to afford them.  Ommie Degeorge consult case management.  Sahara Fujimoto M. Sudeep Scheibel  MD 07/30/2015 2:16 PM   

## 2015-07-30 NOTE — Progress Notes (Signed)
Patient admitted to unit. Oriented to room, call bell, and staff. Bed in lowest position. Fall safety plan reviewed, patient refused to wear hospital socks. Full assessment to Epic. Skin assessment verified with Greer EeSusan Presto RN. Telemetry box verification with CCMD and Keon NT- Box#: [id number]. Will continue to monitor.

## 2015-07-30 NOTE — ED Notes (Signed)
MD talked to pt and educated him on benefits of staying/risks of leaving. Pt verbalized understanding. Pt still asking to leave.

## 2015-07-30 NOTE — Consult Note (Signed)
ANTICOAGULATION CONSULT NOTE - Initial Consult  Pharmacy Consult for heparin infusion  Indication: chest pain/ACS  Allergies  Allergen Reactions  . Eggs Or Egg-Derived Products Anaphylaxis    Incident happened as a child    Patient Measurements: Height: 6' (182.9 cm) Weight: 272 lb 6.4 oz (123.56 kg) IBW/kg (Calculated) : 77.6 Heparin Dosing Weight: 104 kg  Vital Signs: Temp: 97.8 F (36.6 C) (04/09 1254) Temp Source: Oral (04/09 1254) BP: 164/98 mmHg (04/09 1629) Pulse Rate: 91 (04/09 1629)  Labs:  Recent Labs  07/30/15 0735 07/30/15 1403 07/30/15 1636  HGB 16.8  --   --   HCT 49.4  --   --   PLT 235  --   --   APTT 28  --   --   LABPROT 12.8  --   --   INR 0.94  --   --   HEPARINUNFRC  --   --  0.28*  CREATININE 1.09  --   --   TROPONINI 0.15* 0.22*  --     Estimated Creatinine Clearance: 121.1 mL/min (by C-G formula based on Cr of 1.09).   Medical History: Past Medical History  Diagnosis Date  . Hypertension   . Hyperlipidemia   . NSTEMI (non-ST elevated myocardial infarction) (HCC) 07/30/2015   Assessment: 40 yo male with PMH of chronic SOB, HLD and HTN, presented to ED with cough and burning in his chest.  Pharmacy has been consulted for heparin dosing and monitoring for ACS/chest pain.   4/9 HL at 1636 resulted slightly below goal at 0.28 on infusion rate of 1300 units/hr.  Goal of Therapy:  Heparin level 0.3-0.7 units/ml Monitor platelets by anticoagulation protocol: Yes   Plan:  Give 1550 unit heparin bolus (~15 units/kg) Increased infusion rate to 1500 units/hr (~14.5 units/kg/hr)  Will recheck heparin level in 6 hours. CBC ordered with AM labs tomorrow.  Keturah BarreMary Jasmarie Coppock, PharmD Clinical Pharmacist  07/30/2015,5:37 PM

## 2015-07-31 ENCOUNTER — Encounter: Payer: Self-pay | Admitting: Registered Nurse

## 2015-07-31 ENCOUNTER — Encounter
Admission: EM | Disposition: A | Discharge status: Home / Self Care | Payer: Self-pay | Source: Home / Self Care | Attending: Internal Medicine

## 2015-07-31 DIAGNOSIS — I10 Essential (primary) hypertension: Secondary | ICD-10-CM

## 2015-07-31 DIAGNOSIS — I2510 Atherosclerotic heart disease of native coronary artery without angina pectoris: Secondary | ICD-10-CM

## 2015-07-31 HISTORY — PX: CARDIAC CATHETERIZATION: SHX172

## 2015-07-31 LAB — CBC
HCT: 51.7 % (ref 40.0–52.0)
HEMOGLOBIN: 17.5 g/dL (ref 13.0–18.0)
MCH: 31 pg (ref 26.0–34.0)
MCHC: 33.8 g/dL (ref 32.0–36.0)
MCV: 91.5 fL (ref 80.0–100.0)
PLATELETS: 264 10*3/uL (ref 150–440)
RBC: 5.65 MIL/uL (ref 4.40–5.90)
RDW: 14.2 % (ref 11.5–14.5)
WBC: 7.7 10*3/uL (ref 3.8–10.6)

## 2015-07-31 LAB — ECHOCARDIOGRAM COMPLETE
Height: 72 in
Weight: 4358.4 oz

## 2015-07-31 LAB — LIPID PANEL
CHOL/HDL RATIO: 5.9 ratio
CHOLESTEROL: 253 mg/dL — AB (ref 0–200)
HDL: 43 mg/dL (ref >40)
LDL Cholesterol: 156 mg/dL — ABNORMAL HIGH (ref 0–99)
TRIGLYCERIDES: 269 mg/dL — AB (ref <150)
VLDL: 54 mg/dL — AB (ref 0–40)

## 2015-07-31 LAB — BASIC METABOLIC PANEL
ANION GAP: 7 (ref 5–15)
BUN: 13 mg/dL (ref 6–20)
CALCIUM: 9.5 mg/dL (ref 8.9–10.3)
CO2: 23 mmol/L (ref 22–32)
Chloride: 105 mmol/L (ref 101–111)
Creatinine, Ser: 0.87 mg/dL (ref 0.61–1.24)
Glucose, Bld: 106 mg/dL — ABNORMAL HIGH (ref 65–99)
Potassium: 3.9 mmol/L (ref 3.5–5.1)
SODIUM: 135 mmol/L (ref 135–145)

## 2015-07-31 LAB — TROPONIN I
TROPONIN I: 0.37 ng/mL — AB (ref <0.031)
Troponin I: 0.28 ng/mL — ABNORMAL HIGH (ref <0.031)

## 2015-07-31 LAB — HEPARIN LEVEL (UNFRACTIONATED)
Heparin Unfractionated: 0.3 IU/mL (ref 0.30–0.70)
Heparin Unfractionated: 0.42 IU/mL (ref 0.30–0.70)

## 2015-07-31 SURGERY — LEFT HEART CATH AND CORONARY ANGIOGRAPHY
Anesthesia: Moderate Sedation

## 2015-07-31 MED ORDER — NITROGLYCERIN 5 MG/ML IV SOLN
INTRAVENOUS | Status: AC
  Filled 2015-07-31: qty 10

## 2015-07-31 MED ORDER — ASPIRIN 81 MG PO CHEW
CHEWABLE_TABLET | ORAL | Status: AC
  Filled 2015-07-31: qty 3

## 2015-07-31 MED ORDER — ASPIRIN 81 MG PO CHEW: 81.0000 mg | CHEWABLE_TABLET | ORAL | Status: DC

## 2015-07-31 MED ORDER — ASPIRIN 81 MG PO CHEW
CHEWABLE_TABLET | ORAL | Status: DC | PRN
Start: 2015-07-31 — End: 2015-07-31
  Administered 2015-07-31: 243 mg via ORAL

## 2015-07-31 MED ORDER — SODIUM CHLORIDE 0.9% FLUSH: 3.0000 mL | INTRAVENOUS | Status: DC | PRN

## 2015-07-31 MED ORDER — SODIUM CHLORIDE 0.9% FLUSH: 3.0000 mL | Freq: Two times a day (BID) | INTRAVENOUS | Status: DC

## 2015-07-31 MED ORDER — MIDAZOLAM HCL 2 MG/2ML IJ SOLN
INTRAMUSCULAR | Status: AC
Start: 2015-07-31 — End: 2015-07-31
  Filled 2015-07-31: qty 2

## 2015-07-31 MED ORDER — CARVEDILOL 6.25 MG PO TABS
6.2500 mg | ORAL_TABLET | Freq: Two times a day (BID) | ORAL | Status: DC
  Administered 2015-07-31: 6.25 mg via ORAL
  Filled 2015-07-31: qty 1

## 2015-07-31 MED ORDER — SODIUM CHLORIDE 0.9 % IV SOLN: 250.0000 mL | INTRAVENOUS | Status: DC | PRN

## 2015-07-31 MED ORDER — LISINOPRIL 10 MG PO TABS
ORAL_TABLET | ORAL | Status: AC
  Filled 2015-07-31: qty 1

## 2015-07-31 MED ORDER — MIDAZOLAM HCL 2 MG/2ML IJ SOLN
INTRAMUSCULAR | Status: AC
  Filled 2015-07-31: qty 2

## 2015-07-31 MED ORDER — FENTANYL CITRATE (PF) 100 MCG/2ML IJ SOLN
INTRAMUSCULAR | Status: AC
  Filled 2015-07-31: qty 2

## 2015-07-31 MED ORDER — TICAGRELOR 90 MG PO TABS
ORAL_TABLET | ORAL | Status: AC
  Filled 2015-07-31: qty 2

## 2015-07-31 MED ORDER — MIDAZOLAM HCL 2 MG/2ML IJ SOLN
INTRAMUSCULAR | Status: DC | PRN
  Administered 2015-07-31 (×3): 1 mg via INTRAVENOUS

## 2015-07-31 MED ORDER — SODIUM CHLORIDE 0.9 % IV SOLN
INTRAVENOUS | Status: AC
  Administered 2015-07-31: 14:00:00 via INTRAVENOUS

## 2015-07-31 MED ORDER — SODIUM CHLORIDE 0.9% FLUSH
3.0000 mL | Freq: Two times a day (BID) | INTRAVENOUS | Status: DC
  Administered 2015-07-31: 3 mL via INTRAVENOUS

## 2015-07-31 MED ORDER — VERAPAMIL HCL 2.5 MG/ML IV SOLN
INTRAVENOUS | Status: AC
  Filled 2015-07-31: qty 2

## 2015-07-31 MED ORDER — FENTANYL CITRATE (PF) 100 MCG/2ML IJ SOLN
INTRAMUSCULAR | Status: DC | PRN
  Administered 2015-07-31 (×2): 50 ug via INTRAVENOUS

## 2015-07-31 MED ORDER — LISINOPRIL 10 MG PO TABS
20.0000 mg | ORAL_TABLET | Freq: Every day | ORAL | Status: DC
  Administered 2015-07-31: 10 mg via ORAL
  Filled 2015-07-31: qty 2

## 2015-07-31 MED ORDER — TICAGRELOR 90 MG PO TABS
90.0000 mg | ORAL_TABLET | Freq: Two times a day (BID) | ORAL | Status: DC
  Administered 2015-07-31 – 2015-08-01 (×2): 90 mg via ORAL
  Filled 2015-07-31 (×2): qty 1

## 2015-07-31 MED ORDER — TICAGRELOR 90 MG PO TABS
ORAL_TABLET | ORAL | Status: DC | PRN
  Administered 2015-07-31: 180 mg via ORAL

## 2015-07-31 MED ORDER — SODIUM CHLORIDE 0.9 % WEIGHT BASED INFUSION: 3.0000 mL/kg/h | INTRAVENOUS | Status: DC

## 2015-07-31 MED ORDER — IOPAMIDOL (ISOVUE-300) INJECTION 61%
INTRAVENOUS | Status: DC | PRN
  Administered 2015-07-31: 150 mL via INTRA_ARTERIAL

## 2015-07-31 MED ORDER — LABETALOL HCL 5 MG/ML IV SOLN
INTRAVENOUS | Status: AC
  Administered 2015-07-31: 14:00:00
  Filled 2015-07-31: qty 4

## 2015-07-31 MED ORDER — HEPARIN SODIUM (PORCINE) 1000 UNIT/ML IJ SOLN
INTRAMUSCULAR | Status: AC
  Filled 2015-07-31: qty 1

## 2015-07-31 MED ORDER — LABETALOL HCL 5 MG/ML IV SOLN
20.0000 mg | Freq: Once | INTRAVENOUS | Status: AC
  Administered 2015-07-31: 20 mg via INTRAVENOUS

## 2015-07-31 MED ORDER — VERAPAMIL HCL 2.5 MG/ML IV SOLN
INTRAVENOUS | Status: DC | PRN
  Administered 2015-07-31: 2.5 mg via INTRA_ARTERIAL

## 2015-07-31 MED ORDER — SODIUM CHLORIDE 0.9 % WEIGHT BASED INFUSION
1.0000 mL/kg/h | INTRAVENOUS | Status: DC
  Administered 2015-07-31: 1 mL/kg/h via INTRAVENOUS

## 2015-07-31 MED ORDER — HEPARIN (PORCINE) IN NACL 2-0.9 UNIT/ML-% IJ SOLN
INTRAMUSCULAR | Status: AC
  Filled 2015-07-31: qty 1000

## 2015-07-31 MED ORDER — NITROGLYCERIN 1 MG/10 ML FOR IR/CATH LAB
INTRA_ARTERIAL | Status: DC | PRN
  Administered 2015-07-31: 200 ug via INTRACORONARY

## 2015-07-31 MED ORDER — HEPARIN SODIUM (PORCINE) 1000 UNIT/ML IJ SOLN
INTRAMUSCULAR | Status: DC | PRN
  Administered 2015-07-31: 2000 [IU] via INTRAVENOUS
  Administered 2015-07-31: 6000 [IU] via INTRAVENOUS

## 2015-07-31 SURGICAL SUPPLY — 13 items
BALLN TREK RX 2.5X12 (BALLOONS) ×3
BALLOON TREK RX 2.5X12 (BALLOONS) ×1 IMPLANT
CATH INFINITI 5 FR JL3.5 (CATHETERS) ×3 IMPLANT
CATH OPTITORQUE JACKY 4.0 5F (CATHETERS) ×3 IMPLANT
CATH VISTA GUIDE 6FR JR4 (CATHETERS) ×3 IMPLANT
DEVICE INFLAT 30 PLUS (MISCELLANEOUS) ×3 IMPLANT
DEVICE RAD TR BAND REGULAR (VASCULAR PRODUCTS) ×3 IMPLANT
GLIDESHEATH SLEND SS 6F .021 (SHEATH) ×3 IMPLANT
KIT MANI 3VAL PERCEP (MISCELLANEOUS) ×3 IMPLANT
PACK CARDIAC CATH (CUSTOM PROCEDURE TRAY) ×3 IMPLANT
STENT RESOLUTE INTEG 3.0X22 (Permanent Stent) ×3 IMPLANT
WIRE RUNTHROUGH .014X180CM (WIRE) ×3 IMPLANT
WIRE SAFE-T 1.5MM-J .035X260CM (WIRE) ×3 IMPLANT

## 2015-07-31 NOTE — Consult Note (Signed)
ANTICOAGULATION CONSULT NOTE - Initial Consult  Pharmacy Consult for heparin infusion  Indication: chest pain/ACS  Allergies  Allergen Reactions  . Eggs Or Egg-Derived Products Anaphylaxis    Incident happened as a child    Patient Measurements: Height: 6' (182.9 cm) Weight: 272 lb 6.4 oz (123.56 kg) IBW/kg (Calculated) : 77.6 Heparin Dosing Weight: 104 kg  Vital Signs: Temp: 97.7 F (36.5 C) (04/09 2032) Temp Source: Oral (04/09 2032) BP: 148/90 mmHg (04/09 2032) Pulse Rate: 99 (04/09 2032)  Labs:  Recent Labs  07/30/15 0735 07/30/15 1403 07/30/15 1636 07/30/15 1930 07/31/15 0054  HGB 16.8  --   --   --  17.5  HCT 49.4  --   --   --  51.7  PLT 235  --   --   --  264  APTT 28  --   --   --   --   LABPROT 12.8  --   --   --   --   INR 0.94  --   --   --   --   HEPARINUNFRC  --   --  0.28*  --  0.30  CREATININE 1.09  --   --   --  0.87  TROPONINI 0.15* 0.22*  --  0.27* 0.37*    Estimated Creatinine Clearance: 151.7 mL/min (by C-G formula based on Cr of 0.87).   Medical History: Past Medical History  Diagnosis Date  . Hypertension   . Hyperlipidemia   . NSTEMI (non-ST elevated myocardial infarction) (HCC) 07/30/2015   Assessment: 40 yo male with PMH of chronic SOB, HLD and HTN, presented to ED with cough and burning in his chest.  Pharmacy has been consulted for heparin dosing and monitoring for ACS/chest pain.   4/9 HL at 1636 resulted slightly below goal at 0.28 on infusion rate of 1300 units/hr.  Goal of Therapy:  Heparin level 0.3-0.7 units/ml Monitor platelets by anticoagulation protocol: Yes   Plan:  Give 1550 unit heparin bolus (~15 units/kg) Increased infusion rate to 1500 units/hr (~14.5 units/kg/hr)  Will recheck heparin level in 6 hours. CBC ordered with AM labs tomorrow.  4/10 01:00 heparin level 0.30. Recheck in 6 hours to confirm.  Keturah BarreMary Swayne, PharmD Clinical Pharmacist  07/31/2015,4:14 AM

## 2015-07-31 NOTE — Progress Notes (Signed)
Hospital Problem List     Active Problems:   NSTEMI (non-ST elevated myocardial infarction) [facility](HCC)    Patient Profile:   Primary Cardiologist: New to [facility]  40 y.o. male with h/o poorly controlled HTN, HLD, ongoing tobacco abuse, and medication noncompliance who presented to [facility] 07/[facility]/2015 with chest pain this morning and  found to have an elevated troponin of in the setting of accelerated HTN with BP of 190/105.  Subjective   Denies any repeat episodes of chest pain overnight. Anxious to leave the hospital, saying "I just want some fresh air".  Upon being informed his troponin values have continued to climb, he is in agreement to stay for a cardiac catheterization (scheduled at 11:30 this morning).   Inpatient Medications    . amLODipine  5 mg Oral Daily  . [START ON 08/01/2015] aspirin  81 mg Oral Pre-Cath  . aspirin EC  81 mg Oral Daily  . atorvastatin  80 mg Oral q1800  . clopidogrel  75 mg Oral Daily  . metoprolol tartrate  12.5 mg Oral BID  . sodium chloride flush  3 mL Intravenous Q12H  . sodium chloride flush  3 mL Intravenous Q12H    Vital Signs    Filed Vitals:   07/30/15 1417 07/30/15 1629 07/30/15 2032 07/31/15 0421  BP: 160/98 164/98 148/90 148/90  Pulse: 94 91 99 85  Temp:   97.7 F (36.5 C) 98.3 F (36.8 C)  TempSrc:   Oral Oral  Resp:   20 24  Height:      Weight:      SpO2:   97% 98%    Intake/Output Summary (Last 24 hours) at 07/31/15 0952 Last data filed at 07/31/15 [id number]  Gross per 24 hour  Intake 299.69 ml  Output   1325 ml  Net -1025.31 ml   Filed Weights   07/30/15 0720 07/30/15 1254  Weight: 271 lb (122.925 kg) 272 lb 6.4 oz (123.56 kg)    Physical Exam    General: Obese African-American male appearing in no acute distress. Head: Normocephalic, atraumatic.  Neck: Supple without bruits, JVD not elevated. Lungs:  Resp regular and unlabored, CTA without wheezing or rales. Heart: RRR, S1, S2, no S3, S4, or murmur; no rub. Abdomen:  Soft, non-tender, non-distended with normoactive bowel sounds. No hepatomegaly. No rebound/guarding. No obvious abdominal masses. Extremities: No clubbing, cyanosis, or edema. Distal pedal pulses are 2+ bilaterally. Neuro: Alert and oriented X 3. Moves all extremities spontaneously. Psych: Normal affect.  Labs    CBC  Recent Labs  07/30/15 0735 07/31/15 0054  WBC 5.2 7.7  HGB 16.8 17.5  HCT 49.4 51.7  MCV 91.1 91.5  PLT 235 264   Basic Metabolic Panel  Recent Labs  07/30/15 0735 07/31/15 0054  NA 134* 135  K 4.0 3.9  CL 105 105  CO2 22 23  GLUCOSE 183* 106*  BUN 14 13  CREATININE 1.09 0.87  CALCIUM 9.0 9.5   Liver Function Tests  Recent Labs  07/30/15 0735  AST 27  ALT 22  ALKPHOS 55  BILITOT 0.6  PROT 6.9  ALBUMIN 4.0   No results for input(s): LIPASE, AMYLASE in the last 72 hours. Cardiac Enzymes  Recent Labs  07/30/15 1930 07/31/15 0054 07/31/15 0638  TROPONINI 0.27* 0.37* 0.28*   BNP Invalid input(s): POCBNP D-Dimer No results for input(s): DDIMER in the last 72 hours. Hemoglobin A1C  Recent Labs  07/30/15 0735  HGBA1C 5.9   Fasting Lipid  Panel  Recent Labs  07/31/15 0054  CHOL 253*  HDL 43  LDLCALC 156*  TRIG 269*  CHOLHDL 5.9   Thyroid Function Tests No results for input(s): TSH, T4TOTAL, T3FREE, THYROIDAB in the last 72 hours.  Invalid input(s): FREET3  Telemetry    NSR, HR in 80's - 90's. No atopic events.   Cardiac Studies and Radiology    Dg Chest 2 View: 07/30/2015  CLINICAL DATA:  Chest burning for a few minutes this morning. History of hypertension. Coughing. EXAM: CHEST  2 VIEW COMPARISON:  11/04/2013 FINDINGS: Lung markings are slightly more prominent compared to the previous examination. There is no evidence for overt pulmonary edema or focal airspace disease. No large pleural effusions. Densities along the anterior chest on the lateral view are more prominent than the previous examination. This may be related to  pericardial fat. Trachea is midline. Heart and mediastinum are within normal limits. No suspicious bone findings. IMPRESSION: Slightly prominent lung markings without focal airspace disease or pulmonary edema. Overall, no acute chest findings. Density along the anterior chest on the lateral view is probably related to pericardial fat. Electronically Signed   By: Richarda Overlie M.D.   On: 07/30/2015 08:04   Echocardiogram: 07/30/2015 Study Conclusions  - Procedure narrative: Transthoracic echocardiography. Image  quality was poor. The study was technically difficult, as a  result of poor sound wave transmission and body habitus. - Left ventricle: The cavity size was normal. Wall thickness was  increased in a pattern of mild LVH. Systolic function was normal.  The estimated ejection fraction was in the range of 55% to 60%. - Aortic valve: Valve area (Vmax): 1.85 cm^2.  Assessment & Plan    1. NSTEMI - Patient's symptoms of chest discomfort persisted for 30 seconds by his account. Initial troponin at 0.15 with repeat values up to 0.37. -Currently on heparin drip. Discussed with Dr. Kirke Corin. Will plan for cardiac catheterization at 11:30 this morning. The risks and benefits of the procedure were discussed with the patient and he agrees to proceed. He understands that if PCI is performed, he will not be discharged today. - LDL elevated to 156.  - continue ASA, statin, BB, and Amlodipine. Started on Plavix while admitted. Can d/c if cath is normal.  2. Accelerated HTN - SBP was elevated into the 190's at time of arrival. Improved to 148/90 on recent reading. - Losartan  daily held in anticipation of cath. Will resume following cath. - continue Amlodipine  daily and Lopressor 12.5mg  BID. Titrate as needed.  3. Polysubstance abuse - reports over a 60-pack year history and consuming half of a fifth of liquor when he drinks. Occasional marijuana use. - Tobacco cessation advised.  4.  Obesity -Weight loss advised  5. HLD - LDL at 156. - continue statin therapy.   Leonides Schanz Ellsworth Lennox , PA-C 9:52 AM 07/31/2015 Pager: [phone]

## 2015-07-31 NOTE — Interval H&P Note (Signed)
Cath Lab Visit (complete for each Cath Lab visit)  Clinical Evaluation Leading to the Procedure:   ACS: Yes.    Non-ACS:    Anginal Classification: CCS IV  Anti-ischemic medical therapy: Minimal Therapy (1 class of medications)  Non-Invasive Test Results: No non-invasive testing performed  Prior CABG: No previous CABG      History and Physical Interval Note:  07/31/2015 12:06 PM  Jordan Maxwell  has presented today for surgery, with the diagnosis of NSTEMI  The various methods of treatment have been discussed with the patient and family. After consideration of risks, benefits and other options for treatment, the patient has consented to  Procedure(s): Left Heart Cath and Coronary Angiography (N/A) as a surgical intervention .  The patient's history has been reviewed, patient examined, no change in status, stable for surgery.  I have reviewed the patient's chart and labs.  Questions were answered to the patient's satisfaction.     Lorine BearsMuhammad Becky Berberian

## 2015-07-31 NOTE — Progress Notes (Signed)
Pt clinically stable post heart cath with pci/stent to rca, vss, no bleeding nor hematoma at site, band down with dressing applied, will monitor for re/bleed. Report called to care nurse Teodoro Kiloll Rn on 2a with orders and [plan reviewed. Taking po's without difficulty, labetolol iv given earlier for hypertension, with vss now, however refused to take lisinopril po when attempting to give per orders. Stating he's afraid to cause swelling in throat. Had large bm while here. Voiding qs. sr per monitor, bp now stable.

## 2015-07-31 NOTE — Progress Notes (Signed)
No changes. No bleeding noted at right radial. Pulses present and unchanged, pt denies complaints, ready to transfer back to room, taking po's without difficulty. sr per monitor,

## 2015-07-31 NOTE — H&P (View-Only) (Signed)
Cardiology Consultation Note  Patient ID: Jordan Maxwell, MRN: [id number], DOB/AGE: 42-08-1975 42 y.o. Admit date: 07/30/2015   Date of Consult: 07/30/2015 Primary Physician: No PCP Per Patient Primary Cardiologist: [facility] Time of consult: 1300 Requesting Physician: Dr. Clint GuyHower, MD  Chief Complaint: Chest pain Reason for Consult: NSTEMI  HPI: 40 y.o. male with h/o poorly controlled HTN, HLD, ongoing tobacco abuse, and medication noncompliance who [facility] with chest pain this morning and was found to have an elevated troponin of in the setting of accelerated HTN with BP of 190/105.  He has no previously known cardiac history and has never seen a cardiologist before. 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 He usually gets his blood pressure under control just prior to needing to get his DOT license renewed. After he gets the license renewed he reports going back to eating a poor diet and having medication noncompliance. He has previously been advised to see a cardiologist given his poorly controlled blood pressure for risk stratification, however he did not follow through with this plan. He does not check his blood pressure at home. He has smoked tobacco since age 40 years at 2.5 packs to 1.5 packs daily and continues to smoke to date. He rarely drinks ETOH on the weeks, however when he does he Generoso Cropper drink 1/2 a fifth at a time. He previously smoked marijuana, though denies any other illegal drugs. He has never had chest pain before and has not noticed any changes in his functional capacity. He work up this morning to a 30 second episode of what he describes as chest discomfort/reflux. He denies this being chest pain. He drank some juice and the symptoms went away. There was some associated nausea and SOB. He has been symptom free since.   Upon the [facility]'s [facility] they were found to have an troponin of 0.15. Cardiology has ordered further troponin levels upon being  consulted. ECG showed NSR, 98 bpm, nonspecific inferolateral st/t changes. 8:44 AM - NSR 88 bpm, nonspecific inferolateral st elevation without reciprocal changes. Cardiology has ordered stat repeat EKG based on EKG changes above, which showed NSR, 96 bpm, improved inferolateral st/t changes. CXR showed increased lung markings without focal airspace disease or pulmonary edema. He was started on a heparin gtt. He is chest pain free currently.   Past Medical History  Diagnosis Date  . Hypertension   . Hyperlipidemia   . NSTEMI (non-ST elevated myocardial infarction) (HCC) 07/30/2015      Most Recent Cardiac Studies: None.   Surgical History: History reviewed. No pertinent past surgical history.   Home Meds: Prior to Admission medications   Medication Sig Start Date End Date Taking? Authorizing Provider  atorvastatin (LIPITOR) 20 MG tablet Take 20 mg by mouth daily.    Historical Provider, MD  losartan (COZAAR) 100 MG tablet Take 1 tablet (100 mg total) by mouth daily. Patient not taking: Reported on 07/30/2015 11/25/14   Edwena FeltyAshany Sundaram, MD    Inpatient Medications:  . aspirin EC  81 mg Oral Daily  . atorvastatin  80 mg Oral q1800  . metoprolol tartrate  12.5 mg Oral BID  . sodium chloride flush  3 mL Intravenous Q12H   . heparin 1,300 Units/hr (07/30/15 0935)    Allergies:  Allergies  Allergen Reactions  . Eggs Or Egg-Derived Products Anaphylaxis    Incident happened as a child    Social History   Social History  . Marital Status:  Single    Spouse Name: N/A  . Number of Children: 4  . Years of Education: N/A   Occupational History  . Truck Education officer, community   Social History Main Topics  . Smoking status: Current Every Day Smoker -- 1.50 packs/day for 26 years    Types: Cigarettes  . Smokeless tobacco: Not on file  . Alcohol Use: 0.0 oz/week    0 Standard drinks or equivalent per week     Comment: ocassionally  . Drug Use: No  . Sexual Activity:    Partners:  Female    Copy: None   Other Topics Concern  . Not on file   Social History Narrative     Family History  Problem Relation Age of Onset  . Hypertension Mother   . Diabetes Mother      Review of Systems: Review of Systems  Constitutional: Positive for malaise/fatigue. Negative for fever, chills, weight loss and diaphoresis.  HENT: Negative for congestion.   Eyes: Negative for discharge and redness.  Respiratory: Positive for shortness of breath. Negative for cough and wheezing.   Cardiovascular: Positive for chest pain. Negative for palpitations, orthopnea, claudication, leg swelling and PND.  Gastrointestinal: Positive for nausea. Negative for heartburn, vomiting, abdominal pain, blood in stool and melena.  Musculoskeletal: Negative for myalgias and falls.  Skin: Negative for rash.  Neurological: Positive for weakness. Negative for dizziness, tingling, tremors, sensory change, speech change, focal weakness and loss of consciousness.  Endo/Heme/Allergies: Does not bruise/bleed easily.  Psychiatric/Behavioral: Positive for substance abuse. The patient is not nervous/anxious.        Ongoing tobacco abuse, prior marijuana abuse, rare ETOH abuse   All other systems reviewed and are negative.   Labs:  Recent Labs  07/30/15 0735  TROPONINI 0.15*   Lab Results  Component Value Date   WBC 5.2 07/30/2015   HGB 16.8 07/30/2015   HCT 49.4 07/30/2015   MCV 91.1 07/30/2015   PLT 235 07/30/2015    Recent Labs Lab 07/30/15 0735  NA 134*  K 4.0  CL 105  CO2 22  BUN 14  CREATININE 1.09  CALCIUM 9.0  PROT 6.9  BILITOT 0.6  ALKPHOS 55  ALT 22  AST 27  GLUCOSE 183*   No results found for: CHOL, HDL, LDLCALC, TRIG No results found for: DDIMER  Radiology/Studies:  Dg Chest 2 View  07/30/2015  CLINICAL DATA:  Chest burning for a few minutes this morning. History of hypertension. Coughing. EXAM: CHEST  2 VIEW COMPARISON:  11/04/2013 FINDINGS: Lung  markings are slightly more prominent compared to the previous examination. There is no evidence for overt pulmonary edema or focal airspace disease. No large pleural effusions. Densities along the anterior chest on the lateral view are more prominent than the previous examination. This may be related to pericardial fat. Trachea is midline. Heart and mediastinum are within normal limits. No suspicious bone findings. IMPRESSION: Slightly prominent lung markings without focal airspace disease or pulmonary edema. Overall, no acute chest findings. Density along the anterior chest on the lateral view is probably related to pericardial fat. Electronically Signed   By: Richarda Overlie M.D.   On: 07/30/2015 08:04    EKG: 7:24 AM - NSR, 98 bpm, nonspecific inferolateral st/t changes. 8:44 AM - NSR 88 bpm, nonspecific inferolateral st elevation without reciprocal changes   Weights: Filed Weights   07/30/15 0720 07/30/15 1254  Weight: 271 lb (122.925 kg) 272 lb 6.4 oz (  123.56 kg)     Physical Exam: Blood pressure 163/95, pulse 93, temperature 97.8 F (36.6 C), temperature source Oral, resp. rate 20, height 6' (1.829 m), weight 272 lb 6.4 oz (123.56 kg), SpO2 100 %. Body mass index is 36.94 kg/(m^2). General: Well developed, well nourished, in no acute distress. Head: Normocephalic, atraumatic, sclera non-icteric, no xanthomas, nares are without discharge.  Neck: Negative for carotid bruits. JVD not elevated. Lungs: Clear bilaterally to auscultation without wheezes, rales, or rhonchi. Breathing is unlabored. Heart: RRR with S1 S2. No murmurs, rubs, or gallops appreciated. Abdomen: Obese, soft, non-tender, non-distended with normoactive bowel sounds. No hepatomegaly. No rebound/guarding. No obvious abdominal masses. Msk:  Strength and tone appear normal for age. Extremities: No clubbing or cyanosis. No edema.  Distal pedal pulses are 2+ and equal bilaterally. Neuro: Alert and oriented X 3. No facial asymmetry.  No focal deficit. Moves all extremities spontaneously. Psych:  Responds to questions appropriately with a normal affect.    Assessment and Plan:   1. Elevated troponin: -Patient's symptoms persisted for 30 seconds by his account -Initial troponin 0.15 at 7:35 AM, stat troponin ordered at time of cardiology consult to trend -Currently on heparin gtt -If troponin trends upwards would continue heparin gtt and plan for cardiac cath on 4/10 with Dr. Kirke Corin, MD -If troponin is flat /down trending could possibly be in the setting of supply demand ischemia given the patient's accelerated hypertension  -Check urine drug screen -Lipid and A1c for risk stratification  -Check echo to evaluate LV systolic function, wall motion, and right-sided pressure  2. Accelerated HTN: -As above -Continues to run in the 160's systolic -He was started on Lopressor 12.5 mg bid -Would continue to hold losartan in case he needs cardiac cath 4/10 -Add Norvasc 5 mg daily, titrate to 10 as needed -Could add prn hydralazine   3. Polysubstance abuse: -Check urine drug screen -Tobacco cessation advised  4. Obesity: -Weight loss advised  Jordan Dodge, PA-C Pager: [phone] 07/30/2015, 1:26 PM  I have seen and examined this patient with Eula Listen.  Agree with above, note added to reflect my findings.  On exam, regular rhythm, no murmurs, lungs clear.  Presented to the hospital with stuttering chest pain and found to have a troponin of 0.15 this AM.  Second troponin just drawn.  Mayda Shippee continue to trend to at least 3 troponins.  If his troponins remain flat, Garnett Nunziata potentially do stress testing.  If they trend up, Lashon Hillier possibly need cardiac cath.  His BP is also quite elevated.  Started on norvasc.  He is not currently insured, Keysi Oelkers have case management work with him on finding affordable medications.  His meds may need to be on the $4 list for him to afford them.  Harel Repetto consult case management.  Evony Rezek M. Stephenia Vogan  MD 07/30/2015 2:16 PM

## 2015-07-31 NOTE — Progress Notes (Signed)
Called from cath lab and was informed pt is in specials for procedure. Primary RN was not notified of transfer.

## 2015-07-31 NOTE — Progress Notes (Signed)
[facility] [place] at [facility]   PATIENT NAME: Jordan Maxwell    MR#:  [id number]  DATE OF BIRTH:  Jan 07, 1976  SUBJECTIVE:  CHIEF COMPLAINT:  Patient is resting comfortably during my examination on heparin drip. Denies any chest pain or shortness of breath. Patient had cardiac catheterization today  REVIEW OF SYSTEMS:  CONSTITUTIONAL: No fever, fatigue or weakness.  EYES: No blurred or double vision.  EARS, NOSE, AND THROAT: No tinnitus or ear pain.  RESPIRATORY: No cough, shortness of breath, wheezing or hemoptysis.  CARDIOVASCULAR: No chest pain, orthopnea, edema.  GASTROINTESTINAL: No nausea, vomiting, diarrhea or abdominal pain.  GENITOURINARY: No dysuria, hematuria.  ENDOCRINE: No polyuria, nocturia,  HEMATOLOGY: No anemia, easy bruising or bleeding SKIN: No rash or lesion. MUSCULOSKELETAL: No joint pain or arthritis.   NEUROLOGIC: No tingling, numbness, weakness.  PSYCHIATRY: No anxiety or depression.   DRUG ALLERGIES:   Allergies  Allergen Reactions  . Eggs Or Egg-Derived Products Anaphylaxis    Incident happened as a child    VITALS:  Blood pressure 123/82, pulse 88, temperature 98.3 F (36.8 C), temperature source Oral, resp. rate 25, height 6' (1.829 m), weight 123.56 kg (272 lb 6.4 oz), SpO2 98 %.  PHYSICAL EXAMINATION:  GENERAL:  40 y.o.-year-old patient lying in the bed with no acute distress.  EYES: Pupils equal, round, reactive to light and accommodation. No scleral icterus. Extraocular muscles intact.  HEENT: Head atraumatic, normocephalic. Oropharynx and nasopharynx clear.  NECK:  Supple, no jugular venous distention. No thyroid enlargement, no tenderness.  LUNGS: Normal breath sounds bilaterally, no wheezing, rales,rhonchi or crepitation. No use of accessory muscles of respiration.  CARDIOVASCULAR: S1, S2 normal. No murmurs, rubs, or gallops.  ABDOMEN: Soft, nontender, nondistended. Bowel sounds present. No organomegaly or  mass.  EXTREMITIES: No pedal edema, cyanosis, or clubbing.  NEUROLOGIC: Cranial nerves II through XII are intact. Muscle strength 5/5 in all extremities. Sensation intact. Gait not checked.  PSYCHIATRIC: The patient is alert and oriented x 3.  SKIN: No obvious rash, lesion, or ulcer.    LABORATORY PANEL:   CBC  Recent Labs Lab 07/31/15 0054  WBC 7.7  HGB 17.5  HCT 51.7  PLT 264   ------------------------------------------------------------------------------------------------------------------  Chemistries   Recent Labs Lab 07/30/15 0735 07/31/15 0054  NA 134* 135  K 4.0 3.9  CL 105 105  CO2 22 23  GLUCOSE 183* 106*  BUN 14 13  CREATININE 1.09 0.87  CALCIUM 9.0 9.5  AST 27  --   ALT 22  --   ALKPHOS 55  --   BILITOT 0.6  --    ------------------------------------------------------------------------------------------------------------------  Cardiac Enzymes  Recent Labs Lab 07/31/15 0638  TROPONINI 0.28*   ------------------------------------------------------------------------------------------------------------------  RADIOLOGY:  Dg Chest 2 View  07/30/2015  CLINICAL DATA:  Chest burning for a few minutes this morning. History of hypertension. Coughing. EXAM: CHEST  2 VIEW COMPARISON:  11/04/2013 FINDINGS: Lung markings are slightly more prominent compared to the previous examination. There is no evidence for overt pulmonary edema or focal airspace disease. No large pleural effusions. Densities along the anterior chest on the lateral view are more prominent than the previous examination. This may be related to pericardial fat. Trachea is midline. Heart and mediastinum are within normal limits. No suspicious bone findings. IMPRESSION: Slightly prominent lung markings without focal airspace disease or pulmonary edema. Overall, no acute chest findings. Density along the anterior chest on the lateral view is probably related to pericardial fat. Electronically Signed  By: Richarda Overlie M.D.   On: 07/30/2015 08:04    EKG:   Orders placed or performed during the hospital encounter of 07/30/15  . EKG 12-Lead  . EKG 12-Lead  . EKG 12-Lead  . EKG 12-Lead  . EKG 12-Lead  . EKG 12-Lead  . EKG 12-Lead immediately post procedure  . EKG 12-Lead  . EKG 12-Lead immediately post procedure    ASSESSMENT AND PLAN:   40 year old African-American with history of essential hypertension he presented with chest pain.  1. NSTEMI: Heparin drip provided until catheterization., Initiated aspirin and statin therapy Had cardiac catheterization today, patient had RCA stent placement  nitroglycerin when necessary, morphine when necessary Appreciate  [facility] cardiology recommendations LDL 156  2. Essential hypertension: We'll start beta blockade will hold his other medication as he has not been taking it  3. Tobacco abuse disorder-counseled patient to quit smoking for 3-5 minutes. Refusing nicotine patch   4. Morbid obesity -patient will be benefited with lifestyle changes when  clinically stable     All the record tobacco abuse disorders are reviewed and case discussed with Care Management/Social Workerr. Management plans discussed with the patient, family and they are in agreement.  CODE STATUS: fc   TOTAL TIME TAKING CARE OF THIS PATIENT: 35 minutes.   POSSIBLE D/C IN  Am  DAYS, DEPENDING ON CLINICAL CONDITION.   Ramonita Lab M.D on 07/31/2015 at 3:55 PM  Between 7am to 6pm - Pager - [phone] After 6pm go to [email or web address] - password [email or web address] [facility] [place]  [place] [facility]  Office  [phone]  CC: Primary care physician; No PCP Per Patient

## 2015-07-31 NOTE — Consult Note (Signed)
ANTICOAGULATION CONSULT NOTE - Follow up Consult  Pharmacy Consult for heparin infusion  Indication: chest pain/ACS  Allergies  Allergen Reactions  . Eggs Or Egg-Derived Products Anaphylaxis    Incident happened as a child    Patient Measurements: Height: 6' (182.9 cm) Weight: 272 lb 6.4 oz (123.56 kg) IBW/kg (Calculated) : 77.6 Heparin Dosing Weight: 104 kg  Vital Signs: Temp: 98.3 F (36.8 C) (04/10 0421) Temp Source: Oral (04/10 0421) BP: 148/90 mmHg (04/10 0421) Pulse Rate: 85 (04/10 0421)  Labs:  Recent Labs  07/30/15 0735 07/30/15 1403 07/30/15 1636 07/30/15 1930 07/31/15 0054 07/31/15 0638  HGB 16.8  --   --   --  17.5  --   HCT 49.4  --   --   --  51.7  --   PLT 235  --   --   --  264  --   APTT 28  --   --   --   --   --   LABPROT 12.8  --   --   --   --   --   INR 0.94  --   --   --   --   --   HEPARINUNFRC  --   --  0.28*  --  0.30 0.42  CREATININE 1.09  --   --   --  0.87  --   TROPONINI 0.15* 0.22*  --  0.27* 0.37*  --     Estimated Creatinine Clearance: 151.7 mL/min (by C-G formula based on Cr of 0.87).   Medical History: Past Medical History  Diagnosis Date  . Hypertension   . Hyperlipidemia   . NSTEMI (non-ST elevated myocardial infarction) (HCC) 07/30/2015   Assessment: 40 yo male with PMH of chronic SOB, HLD and HTN, presented to ED with cough and burning in his chest.  Pharmacy has been consulted for heparin dosing and monitoring for ACS/chest pain.   HL at 0054: 0.3 HL at 0638: 0.42  Goal of Therapy:  Heparin level 0.3-0.7 units/ml Monitor platelets by anticoagulation protocol: Yes   Plan:  Continue heparin drip at current rate of 1500 units/hr as second HL was within desired range. Will recheck HL and CBC in AM.   Pharmacy will continue to follow.  Clarisa Schoolsrystal Kaleeah Gingerich, PharmD Clinical Pharmacist 07/31/2015

## 2015-08-01 ENCOUNTER — Encounter: Payer: Self-pay | Admitting: Cardiovascular Disease

## 2015-08-01 ENCOUNTER — Telehealth: Payer: Self-pay | Admitting: Cardiovascular Disease

## 2015-08-01 DIAGNOSIS — I25111 Atherosclerotic heart disease of native coronary artery with angina pectoris with documented spasm: Secondary | ICD-10-CM

## 2015-08-01 DIAGNOSIS — Z720 Tobacco use: Secondary | ICD-10-CM

## 2015-08-01 DIAGNOSIS — I2510 Atherosclerotic heart disease of native coronary artery without angina pectoris: Secondary | ICD-10-CM | POA: Insufficient documentation

## 2015-08-01 DIAGNOSIS — E785 Hyperlipidemia, unspecified: Secondary | ICD-10-CM

## 2015-08-01 DIAGNOSIS — F17200 Nicotine dependence, unspecified, uncomplicated: Secondary | ICD-10-CM | POA: Insufficient documentation

## 2015-08-01 LAB — CBC
HEMATOCRIT: 48 % (ref 40.0–52.0)
HEMOGLOBIN: 16.3 g/dL (ref 13.0–18.0)
MCH: 31 pg (ref 26.0–34.0)
MCHC: 34.1 g/dL (ref 32.0–36.0)
MCV: 90.9 fL (ref 80.0–100.0)
Platelets: 237 10*3/uL (ref 150–440)
RBC: 5.28 MIL/uL (ref 4.40–5.90)
RDW: 13.8 % (ref 11.5–14.5)
WBC: 5.9 10*3/uL (ref 3.8–10.6)

## 2015-08-01 LAB — BASIC METABOLIC PANEL
Anion gap: 6 (ref 5–15)
BUN: 14 mg/dL (ref 6–20)
CHLORIDE: 103 mmol/L (ref 101–111)
CO2: 25 mmol/L (ref 22–32)
CREATININE: 1.07 mg/dL (ref 0.61–1.24)
Calcium: 9 mg/dL (ref 8.9–10.3)
GFR calc Af Amer: >60 mL/min (ref >60)
GFR calc non Af Amer: >60 mL/min (ref >60)
Glucose, Bld: 110 mg/dL — ABNORMAL HIGH (ref 65–99)
POTASSIUM: 4 mmol/L (ref 3.5–5.1)
Sodium: 134 mmol/L — ABNORMAL LOW (ref 135–145)

## 2015-08-01 MED ORDER — AMLODIPINE BESYLATE 5 MG PO TABS: 5.0000 mg | ORAL_TABLET | Freq: Every day | ORAL | Status: DC

## 2015-08-01 MED ORDER — ACETAMINOPHEN 325 MG PO TABS: 650.0000 mg | ORAL_TABLET | Freq: Four times a day (QID) | ORAL | Status: DC | PRN

## 2015-08-01 MED ORDER — NITROGLYCERIN 0.4 MG SL SUBL: 0.4000 mg | SUBLINGUAL_TABLET | SUBLINGUAL | Status: DC | PRN

## 2015-08-01 MED ORDER — ATORVASTATIN CALCIUM 80 MG PO TABS: 80.0000 mg | ORAL_TABLET | Freq: Every day | ORAL | Status: DC

## 2015-08-01 MED ORDER — CARVEDILOL 12.5 MG PO TABS: 12.5000 mg | ORAL_TABLET | Freq: Two times a day (BID) | ORAL | Status: DC

## 2015-08-01 MED ORDER — ASPIRIN 81 MG PO TBEC: 81.0000 mg | DELAYED_RELEASE_TABLET | Freq: Every day | ORAL | Status: DC

## 2015-08-01 MED ORDER — CARVEDILOL 12.5 MG PO TABS
12.5000 mg | ORAL_TABLET | Freq: Two times a day (BID) | ORAL | Status: DC
  Administered 2015-08-01: 12.5 mg via ORAL
  Filled 2015-08-01: qty 1

## 2015-08-01 MED ORDER — TICAGRELOR 90 MG PO TABS: 90.0000 mg | ORAL_TABLET | Freq: Two times a day (BID) | ORAL | Status: DC

## 2015-08-01 MED ORDER — LEVALBUTEROL HCL 1.25 MG/0.5ML IN NEBU
INHALATION_SOLUTION | RESPIRATORY_TRACT | Status: AC
Start: 2015-08-01 — End: 2015-08-02
  Filled 2015-08-01: qty 0.5

## 2015-08-01 NOTE — Discharge Summary (Signed)
[facility] - [place] at [facility]   PATIENT NAME: Jordan Maxwell    MR#:  [id number]  DATE OF BIRTH:  October 12, 1975  DATE OF ADMISSION:  07/30/2015 ADMITTING PHYSICIAN: Wyatt Haste, MD  DATE OF DISCHARGE: 08/01/2015  PRIMARY CARE PHYSICIAN: No PCP Per Patient    ADMISSION DIAGNOSIS:  NSTEMI (non-ST elevated myocardial infarction) (HCC) [I21.4]  DISCHARGE DIAGNOSIS:  Principal Problem:   NSTEMI (non-ST elevated myocardial infarction) (HCC) Active Problems:   Hyperlipidemia LDL goal <70   Hypertension, accelerated   Coronary artery disease involving native coronary artery of native heart with angina pectoris with documented spasm (HCC)   Smoker   SECONDARY DIAGNOSIS:   Past Medical History  Diagnosis Date  . Hypertension   . Hyperlipidemia   . NSTEMI (non-ST elevated myocardial infarction) (HCC) 07/30/2015  . Coronary artery disease involving native coronary artery of native heart with angina pectoris with documented spasm [facility])     HOSPITAL COURSE:   40 year old African-American with history of essential hypertension he presented with chest pain.  1. NSTEMI: Heparin drip provided until catheterization., Initiated aspirin and statin therapy Had cardiac catheterization today, patient had RCA stent placement , clinically doing fine Discontinued Plavix and patient is started on Brilinta nitroglycerin when necessary provided during the hospital course  Appreciate [facility] cardiology recommendations LDL 156  2. Essential hypertension: Coreg dose at 12.5 mg by mouth twice a day. Patient refuses lisinopril. Started him on amlodipine 5 mg by mouth once daily titrate as needed . Patient's home medication Cozaar was discontinued. He was not taking that as he could not afford it  3. Tobacco abuse disorder-counseled patient to quit smoking for 3-5 minutes. Refusing nicotine patch   4. Morbid obesity -patient will be benefited with  lifestyle changes when clinically stable   DISCHARGE CONDITIONS:   Fair  CONSULTS OBTAINED:  Treatment Team:  Wyatt Haste, MD Will Jorja Loa, MD   PROCEDURES Cardiac catheterization with RCA stent placement on 07/31/2015  DRUG ALLERGIES:   Allergies  Allergen Reactions  . Eggs Or Egg-Derived Products Anaphylaxis    Incident happened as a child    DISCHARGE MEDICATIONS:   Current Discharge Medication List    CONTINUE these medications which have NOT CHANGED   Details  atorvastatin (LIPITOR) 20 MG tablet Take 20 mg by mouth daily.    losartan (COZAAR) 100 MG tablet Take 1 tablet (100 mg total) by mouth daily. Qty: 30 tablet, Refills: 5   Associated Diagnoses: Hypertension goal BP (blood pressure) < 140/90         DISCHARGE INSTRUCTIONS:   Follow-up with primary care physician in a week Follow-up with cardiology in 5-7 days  DIET:  Cardiac diet  DISCHARGE CONDITION:  Fair  ACTIVITY:  Activity as tolerated,No heavy weight lifting with right hand for 2 weeks  OXYGEN:  Home Oxygen: No.   Oxygen Delivery: room air  DISCHARGE LOCATION:  home   If you experience worsening of your admission symptoms, develop shortness of breath, life threatening emergency, suicidal or homicidal thoughts you must seek medical attention immediately by calling 911 or calling your MD immediately  if symptoms less severe.  You Must read complete instructions/literature along with all the possible adverse reactions/side effects for all the Medicines you take and that have been prescribed to you. Take any new Medicines after you have completely understood and accpet all the possible adverse reactions/side effects.   Please note  You were cared for by  a hospitalist during your hospital stay. If you have any questions about your discharge medications or the care you received while you were in the hospital after you are discharged, you can call the unit and asked to speak with  the hospitalist on call if the hospitalist that took care of you is not available. Once you are discharged, your primary care physician will handle any further medical issues. Please note that NO REFILLS for any discharge medications will be authorized once you are discharged, as it is imperative that you return to your primary care physician (or establish a relationship with a primary care physician if you do not have one) for your aftercare needs so that they can reassess your need for medications and monitor your lab values.     Today  Chief Complaint  Patient presents with  . Hypertension   Patient is feeling fine. Dressed up to go home. Denies any chest pain or shortness of breath. Refusing lisinopril because of its side effects, could not afford Cozaar as it is expensive  ROS:  CONSTITUTIONAL: Denies fevers, chills. Denies any fatigue, weakness.  EYES: Denies blurry vision, double vision, eye pain. EARS, NOSE, THROAT: Denies tinnitus, ear pain, hearing loss. RESPIRATORY: Denies cough, wheeze, shortness of breath.  CARDIOVASCULAR: Denies chest pain, palpitations, edema.  GASTROINTESTINAL: Denies nausea, vomiting, diarrhea, abdominal pain. Denies bright red blood per rectum. GENITOURINARY: Denies dysuria, hematuria. ENDOCRINE: Denies nocturia or thyroid problems. HEMATOLOGIC AND LYMPHATIC: Denies easy bruising or bleeding. SKIN: Denies rash or lesion. MUSCULOSKELETAL: Denies pain in neck, back, shoulder, knees, hips or arthritic symptoms.  NEUROLOGIC: Denies paralysis, paresthesias.  PSYCHIATRIC: Denies anxiety or depressive symptoms.   VITAL SIGNS:  Blood pressure 150/92, pulse 83, temperature 98.2 F (36.8 C), temperature source Oral, resp. rate 18, height 6' (1.829 m), weight 123.56 kg (272 lb 6.4 oz), SpO2 92 %.  I/O:   Intake/Output Summary (Last 24 hours) at 08/01/15 1006 Last data filed at 07/31/15 1952  Gross per 24 hour  Intake      3 ml  Output    375 ml  Net    -372 ml    PHYSICAL EXAMINATION:  GENERAL:  40 y.o.-year-old patient lying in the bed with no acute distress.  EYES: Pupils equal, round, reactive to light and accommodation. No scleral icterus. Extraocular muscles intact.  HEENT: Head atraumatic, normocephalic. Oropharynx and nasopharynx clear.  NECK:  Supple, no jugular venous distention. No thyroid enlargement, no tenderness.  LUNGS: Normal breath sounds bilaterally, no wheezing, rales,rhonchi or crepitation. No use of accessory muscles of respiration.  CARDIOVASCULAR: S1, S2 normal. No murmurs, rubs, or gallops.  ABDOMEN: Soft, non-tender, non-distended. Bowel sounds present. No organomegaly or mass.  EXTREMITIES: No pedal edema, cyanosis, or clubbing. Right wrist area with clean dressing  NEUROLOGIC: Cranial nerves II through XII are intact. Muscle strength 5/5 in all extremities. Sensation intact. Gait not checked.  PSYCHIATRIC: The patient is alert and oriented x 3.  SKIN: No obvious rash, lesion, or ulcer.   DATA REVIEW:   CBC  Recent Labs Lab 08/01/15 0509  WBC 5.9  HGB 16.3  HCT 48.0  PLT 237    Chemistries   Recent Labs Lab 07/30/15 0735  08/01/15 0509  NA 134*  < > 134*  K 4.0  < > 4.0  CL 105  < > 103  CO2 22  < > 25  GLUCOSE 183*  < > 110*  BUN 14  < > 14  CREATININE 1.09  < >  1.07  CALCIUM 9.0  < > 9.0  AST 27  --   --   ALT 22  --   --   ALKPHOS 55  --   --   BILITOT 0.6  --   --   < > = values in this interval not displayed.  Cardiac Enzymes  Recent Labs Lab 07/31/15 0638  TROPONINI 0.28*    Microbiology Results  No results found for this or any previous visit.  RADIOLOGY:  Dg Chest 2 View  07/30/2015  CLINICAL DATA:  Chest burning for a few minutes this morning. History of hypertension. Coughing. EXAM: CHEST  2 VIEW COMPARISON:  11/04/2013 FINDINGS: Lung markings are slightly more prominent compared to the previous examination. There is no evidence for overt pulmonary edema or focal  airspace disease. No large pleural effusions. Densities along the anterior chest on the lateral view are more prominent than the previous examination. This may be related to pericardial fat. Trachea is midline. Heart and mediastinum are within normal limits. No suspicious bone findings. IMPRESSION: Slightly prominent lung markings without focal airspace disease or pulmonary edema. Overall, no acute chest findings. Density along the anterior chest on the lateral view is probably related to pericardial fat. Electronically Signed   By: Richarda OverlieAdam  Henn M.D.   On: 07/30/2015 08:04    EKG:   Orders placed or performed during the hospital encounter of 07/30/15  . EKG 12-Lead  . EKG 12-Lead  . EKG 12-Lead  . EKG 12-Lead  . EKG 12-Lead  . EKG 12-Lead  . EKG 12-Lead immediately post procedure  . EKG 12-Lead  . EKG 12-Lead immediately post procedure  . EKG 12-Lead      Management plans discussed with the patient, family and they are in agreement.  CODE STATUS:     Code Status Orders        Start     Ordered   07/30/15 1044  Full code   Continuous     07/30/15 1044    Code Status History    Date Active Date Inactive Code Status Order ID Comments User Context   This patient has a current code status but no historical code status.      TOTAL TIME TAKING CARE OF THIS PATIENT: 45 minutes.    @[facility] @  on 08/01/2015 at 10:06 AM  Between 7am to 6pm - Pager - [phone]  After 6pm go to [email or web address] - password [email or web address] [facility]  [facility]  Office  [phone]  CC: Primary care physician; No PCP Per Patient

## 2015-08-01 NOTE — Telephone Encounter (Signed)
Patient contacted regarding discharge from [facility] on 08/01/15.  Patient understands to follow up with provider Dunn on 4/20 [facility]. Patient understands discharge instructions? yes Patient understands medications and regiment? yes Patient understands to bring all medications to this visit? yes

## 2015-08-01 NOTE — Progress Notes (Signed)
Pt to be discharged today. Has been up ambulating at length all a.m. tol well. Iv and tele removed. disch instructions given to pt and wife to their understanding. disch via w.c. Accompanied by wife.

## 2015-08-01 NOTE — Care Management Note (Signed)
Case Management Note  Patient Details  Name: Carrson Lightcap MRN: [id number] Date of Birth: [birth year]  Subjective/Objective:   Met with patient and his wife at bedside. Applications given for [facility] and Medication Management. Patient to complete Med Management application for CM to fax to Clinic. Email sent to Bonnee Quin at [facility] for follow up appointment.  Patient works, drives and is independent.                Action/Plan:   Expected Discharge Date:    08/01/2015              Expected Discharge Plan:  Home/Self Care  In-House Referral:     Discharge planning Services  CM Consult, Medication Assistance, Homebound not met per provider, [facility]  Post Acute Care Choice:    Choice offered to:     DME Arranged:    DME Agency:     HH Arranged:    HH Agency:     Status of Service:  Completed, signed off  Medicare Important Message Given:    Date Medicare IM Given:    Medicare IM give by:    Date Additional Medicare IM Given:    Additional Medicare Important Message give by:     If discussed at [place] of Stay Meetings, dates discussed:    Additional Comments:  Jolly Mango, RN 08/01/2015, 9:23 AM

## 2015-08-01 NOTE — Telephone Encounter (Signed)
TCM..the patient being discharged today  Saw Dr Kirke CorinArida in hospital is coming to see us on 08/10/15 to see Alycia Rossettiyan

## 2015-08-01 NOTE — Care Management (Signed)
Faxed prescriptions and application to medication management.

## 2015-08-01 NOTE — Discharge Instructions (Signed)
Activity as tolerated, no heavy weight lifting with right hand for 2 weeks Diet cardiac [facility] Follow-up with cardiology Dr. Kirke CorinArida in 5-7 days

## 2015-08-01 NOTE — Progress Notes (Signed)
Patient: Jordan Maxwell / Admit Date: 07/30/2015 / Date of Encounter: 08/01/2015, 8:26 AM   Subjective:  Slept well overnight, no complaints Would like to go home ASAP as he has 4 children ages to all the way up to 415 Reports he is not going to smoke anymore, will take his medications for blood pressure, willing to follow up in clinic  Review of Systems: Review of Systems  Constitutional: Negative.   Respiratory: Negative.   Cardiovascular: Negative.   Gastrointestinal: Negative.   Musculoskeletal: Negative.   Neurological: Negative.   Psychiatric/Behavioral: Negative.   All other systems reviewed and are negative.   Objective: Telemetry: NSR Physical Exam: Blood pressure 150/92, pulse 83, temperature 98.2 F (36.8 C), temperature source Oral, resp. rate 18, height 6' (1.829 m), weight 272 lb 6.4 oz (123.56 kg), SpO2 92 %. Body mass index is 36.94 kg/(m^2). General: Well developed, well nourished, in no acute distress.obese Head: Normocephalic, atraumatic, sclera non-icteric, no xanthomas, nares are without discharge. Neck: Negative for carotid bruits. JVP not elevated. Lungs: Clear bilaterally to auscultation without wheezes, rales, or rhonchi. Breathing is unlabored. Heart: RRR S1 S2 without murmurs, rubs, or gallops.  Abdomen: Soft, non-tender, non-distended with normoactive bowel sounds. No rebound/guarding. Extremities: No clubbing or cyanosis. No edema. Distal pedal pulses are 2+ and equal bilaterally. Neuro: Alert and oriented X 3. Moves all extremities spontaneously. Psych:  Responds to questions appropriately with a normal affect.   Intake/Output Summary (Last 24 hours) at 08/01/15 0826 Last data filed at 07/31/15 1952  Gross per 24 hour  Intake      3 ml  Output    375 ml  Net   -372 ml    Inpatient Medications:  . aspirin EC  81 mg Oral Daily  . atorvastatin  80 mg Oral q1800  . carvedilol  6.25 mg Oral BID WC  . lisinopril  20 mg Oral Daily  . sodium  chloride flush  3 mL Intravenous Q12H  . ticagrelor  90 mg Oral BID   Infusions:    Labs:  Recent Labs  07/31/15 0054 08/01/15 0509  NA 135 134*  K 3.9 4.0  CL 105 103  CO2 23 25  GLUCOSE 106* 110*  BUN 13 14  CREATININE 0.87 1.07  CALCIUM 9.5 9.0    Recent Labs  07/30/15 0735  AST 27  ALT 22  ALKPHOS 55  BILITOT 0.6  PROT 6.9  ALBUMIN 4.0    Recent Labs  07/31/15 0054 08/01/15 0509  WBC 7.7 5.9  HGB 17.5 16.3  HCT 51.7 48.0  MCV 91.5 90.9  PLT 264 237    Recent Labs  07/30/15 1403 07/30/15 1930 07/31/15 0054 07/31/15 0638  TROPONINI 0.22* 0.27* 0.37* 0.28*   Invalid input(s): POCBNP  Recent Labs  07/30/15 0735  HGBA1C 5.9     Weights: Filed Weights   07/30/15 0720 07/30/15 1254  Weight: 271 lb (122.925 kg) 272 lb 6.4 oz (123.56 kg)     Radiology/Studies:  Dg Chest 2 View  07/30/2015  CLINICAL DATA:  Chest burning for a few minutes this morning. History of hypertension. Coughing. EXAM: CHEST  2 VIEW COMPARISON:  11/04/2013 FINDINGS: Lung markings are slightly more prominent compared to the previous examination. There is no evidence for overt pulmonary edema or focal airspace disease. No large pleural effusions. Densities along the anterior chest on the lateral view are more prominent than the previous examination. This may be related to pericardial fat. Trachea is midline.  Heart and mediastinum are within normal limits. No suspicious bone findings. IMPRESSION: Slightly prominent lung markings without focal airspace disease or pulmonary edema. Overall, no acute chest findings. Density along the anterior chest on the lateral view is probably related to pericardial fat. Electronically Signed   By: Richarda Overlie M.D.   On: 07/30/2015 08:04     Assessment and Plan  40 y.o. male presenting with unstable angina, NSTEMI  cardiac catheterization yesterday  showed severe one-vessel coronary artery disease involving the proximal right coronary artery, s/p  angioplasty and drug-eluting stent placement  Long discussion with him today concerning medications, medication compliance, smoking cessation  --Recommended he stay on aspirin 81 mg daily with brilinta 90 mg po Bid coupon card provided Aspirin will be left at the office desk for him to pick up Continue Lipitor, carvedilol   for blood pressure control, with increased carvedilol up to 12.5 mg twice a day, continue lisinopril 20 mg daily   Needs close outpatient follow-up with Dr. Kirke Corin for blood pressure medication titration    Signed, Dossie Arbour, MD, Ph.D. [facility] 08/01/2015, 8:26 AM

## 2015-08-01 NOTE — Progress Notes (Signed)
[facility]         [place]   08/01/2015  Patient: Jordan Maxwell   Date of Birth:  1975/05/19  Date of admission:  07/30/2015  Date of Discharge  08/01/2015    To Whom it May Concern:   Jessup Lynita LombardSimms  may return to work on 08/02/15.  PHYSICAL ACTIVITY:  Moderate, no heavy weight lifting with  the right hand  for 2 weeks  If you have any questions or concerns, please don't hesitate to call.  Sincerely,   Ramonita LabGouru, Alessandro Griep M.D Pager [phone] Office : [phone]   .

## 2015-08-07 ENCOUNTER — Ambulatory Visit: Payer: Self-pay

## 2015-08-09 ENCOUNTER — Encounter: Payer: Self-pay | Admitting: Physician Assistant

## 2015-08-10 ENCOUNTER — Encounter: Payer: Self-pay | Admitting: Physician Assistant

## 2015-08-10 ENCOUNTER — Ambulatory Visit (INDEPENDENT_AMBULATORY_CARE_PROVIDER_SITE_OTHER): Payer: Self-pay | Admitting: Physician Assistant

## 2015-08-10 VITALS — BP 128/82 | HR 83 | Ht 72.0 in | Wt 275.5 lb

## 2015-08-10 DIAGNOSIS — I214 Non-ST elevation (NSTEMI) myocardial infarction: Secondary | ICD-10-CM

## 2015-08-10 DIAGNOSIS — Z9861 Coronary angioplasty status: Secondary | ICD-10-CM

## 2015-08-10 DIAGNOSIS — R0602 Shortness of breath: Secondary | ICD-10-CM

## 2015-08-10 DIAGNOSIS — I25111 Atherosclerotic heart disease of native coronary artery with angina pectoris with documented spasm: Secondary | ICD-10-CM

## 2015-08-10 DIAGNOSIS — Z024 Encounter for examination for driving license: Secondary | ICD-10-CM

## 2015-08-10 DIAGNOSIS — E785 Hyperlipidemia, unspecified: Secondary | ICD-10-CM

## 2015-08-10 DIAGNOSIS — I10 Essential (primary) hypertension: Secondary | ICD-10-CM

## 2015-08-10 DIAGNOSIS — I2510 Atherosclerotic heart disease of native coronary artery without angina pectoris: Secondary | ICD-10-CM

## 2015-08-10 DIAGNOSIS — Z029 Encounter for administrative examinations, unspecified: Secondary | ICD-10-CM

## 2015-08-10 DIAGNOSIS — Z9889 Other specified postprocedural states: Secondary | ICD-10-CM

## 2015-08-10 NOTE — Patient Instructions (Addendum)
Medication Instructions:  Please continue your current medications  Labwork: Today:  CBC & BMET 6 weeks:  Liver, lipid  Date & time: __________________  Testing/Procedures:  Your physician has requested that you have an echocardiogram. Echocardiography is a painless test that uses sound waves to create images of your heart. It provides your doctor with information about the size and shape of your heart and how well your heart's chambers and valves are working. This procedure takes approximately one hour. There are no restrictions for this procedure.  Date & time: _______________________________  Your physician has requested that you have a stress echocardiogram in 2 months.  __X__:  Hold Coreg the night before and morning of procedure  1. No caffeine for 24 hours prior to test 2. No smoking 24 hours prior to test. 3. Your medication may be taken with water.  If your doctor stopped a medication because of this test, do not take that medication.. 4. No perfume, cologne or lotion. 5. Wear comfortable walking shoes. No heels!  Date & time:____________________   Follow-Up: Your physician recommends that you schedule a follow-up appointment in:  2 months (after your stress echo)  Date & time: _________________  If you need a refill on your cardiac medications before your next appointment, please call your pharmacy.   Echocardiogram An echocardiogram, or echocardiography, uses sound waves (ultrasound) to produce an image of your heart. The echocardiogram is simple, painless, obtained within a short period of time, and offers valuable information to your health care provider. The images from an echocardiogram can provide information such as:  Evidence of coronary artery disease (CAD).  Heart size.  Heart muscle function.  Heart valve function.  Aneurysm detection.  Evidence of a past heart attack.  Fluid buildup around the heart.  Heart muscle thickening.  Assess heart  valve function. LET [facility] PROVIDER KNOW ABOUT:  Any allergies you have.  All medicines you are taking, including vitamins, herbs, eye drops, creams, and over-the-counter medicines.  Previous problems you or members of your family have had with the use of anesthetics.  Any blood disorders you have.  Previous surgeries you have had.  Medical conditions you have.  Possibility of pregnancy, if this applies. BEFORE THE PROCEDURE  No special preparation is needed. Eat and drink normally.  PROCEDURE  6. In order to produce an image of your heart, gel will be applied to your chest and a wand-like tool (transducer) will be moved over your chest. The gel will help transmit the sound waves from the transducer. The sound waves will harmlessly bounce off your heart to allow the heart images to be captured in real-time motion. These images will then be recorded. 7. You may need an IV to receive a medicine that improves the quality of the pictures. AFTER THE PROCEDURE You may return to your normal schedule including diet, activities, and medicines, unless your health care provider tells you otherwise.   This information is not intended to replace advice given to you by your health care provider. Make sure you discuss any questions you have with your health care provider.   Document Released: 04/05/2000 Document Revised: 04/29/2014 Document Reviewed: 12/14/2012 Elsevier Interactive Patient Education 2016 [employer] Exercise Stress Echocardiogram An exercise stress echocardiogram is a heart (cardiac) test used to check the function of your heart. This test may also be called an exercise stress echocardiography or stress echo. This stress test will check how well your heart muscle and valves are working and  determine if your heart muscle is getting enough blood. You will exercise on a treadmill to naturally increase or stress the functioning of your heart.  An echocardiogram uses sound  waves (ultrasound) to produce an image of your heart. If your heart does not work normally, it may indicate coronary artery disease with poor coronary blood supply. The coronary arteries are the arteries that bring blood and oxygen to your heart. LET [facility] CARE PROVIDER KNOW ABOUT:  Any allergies you have.  All medicines you are taking, including vitamins, herbs, eye drops, creams, and over-the-counter medicines.  Previous problems you or members of your family have had with the use of anesthetics.  Any blood disorders you have.  Previous surgeries you have had.  Medical conditions you have.  Possibility of pregnancy, if this applies. RISKS AND COMPLICATIONS Generally, this is a safe procedure. However, as with any procedure, complications can occur. Possible complications can include:  You develop pain or pressure in the following areas:  Chest.  Jaw or neck.  Between your shoulder blades.  Radiating down your left arm.  Dizziness or lightheadedness.  Shortness of breath.  Increased or irregular heartbeat.  Nausea or vomiting.  Heart attack (rare). BEFORE THE PROCEDURE 8. Avoid all forms of caffeine for 24 hours before your test or as directed by your health care provider. This includes coffee, tea (even decaffeinated tea), caffeinated sodas, chocolate, cocoa, and certain pain medicines. 9. Follow your health care provider's instructions regarding eating and drinking before the test. 10. Take your medicines as directed at regular times with water unless instructed otherwise. Exceptions may include: 1. If you have diabetes, ask how you are to take your insulin or pills. It is common to adjust insulin dosing the morning of the test. 2. If you are taking beta-blocker medicines, it is important to talk to your health care provider about these medicines well before the date of your test. Taking beta-blocker medicines may interfere with the test. In some cases, these  medicines need to be changed or stopped 24 hours or more before the test. 3. If you wear a nitroglycerin patch, it may need to be removed prior to the test. Ask your health care provider if the patch should be removed before the test. 11. If you use an inhaler for any breathing condition, bring it with you to the test. 12. If you are an outpatient, bring a snack so you can eat right after the stress phase of the test. 13. Do not smoke for 4 hours prior to the test or as directed by your health care provider. 14. Wear loose-fitting clothes and comfortable shoes for the test. This test involves walking on a treadmill. PROCEDURE   Multiple electrodes will be put on your chest. If needed, small areas of your chest may be shaved to get better contact with the electrodes. Once the electrodes are attached to your body, multiple wires will be attached to the electrodes, and your heart rate will be monitored.  You will have an echocardiogram done at rest.  To produce this image of your heart, gel is applied to your chest, and a wand-like tool (transducer) is moved over the chest. The transducer sends the sound waves through the chest to create the moving images of your heart.  You may need an IV to receive a medication that improves the quality of the pictures.  You will then walk on a treadmill. The treadmill will be started at a slow pace. The treadmill  speed and incline will gradually be increased to raise your heart rate.  At the peak of exercise, the treadmill will be stopped. You will lie down immediately on a bed so that a second echocardiogram can be done to visualize your heart's motion with exercise.  The test usually takes 30-60 minutes to complete. AFTER THE PROCEDURE  Your heart rate and blood pressure will be monitored after the test.  You may return to your normal schedule, including diet, activities, and medicines, unless your health care provider tells you otherwise.   This  information is not intended to replace advice given to you by your health care provider. Make sure you discuss any questions you have with your health care provider.   Document Released: 04/12/2004 Document Revised: 04/13/2013 Document Reviewed: 12/14/2012 Elsevier Interactive Patient Education [employer]

## 2015-08-10 NOTE — Progress Notes (Signed)
Cardiology Office Note Date:  08/10/2015  Patient ID:  Jordan Maxwell, DOB [birth year], MRN [id number] PCP:  No PCP Per Patient  Cardiologist:  Dr. Kirke Corin, MD    Chief Complaint: Hospital follow for recent NSTEMI s/p PCI to RCA  History of Present Illness: Jordan Maxwell is a 40 y.o. male with history of recent NSTEMI s/p PCI/DES to the proximal RCA on 07/30/2015, poorly controlled HTN, HLD, ongoing tobacco abuse, and history of medication noncompliance who presents for hospital follow up of the above NSTEMI, being admitted 4/9-4/11.   Prior to the above admission he did not have any previously known cardiac history and had never seen a cardiologist before. 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 Apparently, he usually gets his blood pressure under control just prior to his DOT exam then stops taking his medications and continues to eat poorly. He had previously been advised to see a cardiologist for risk stratification given his poorly controlled BP, obesity, and tobacco abuse; however, he did not follow through. He has smoked tobacco since age 50 at 1.5 to 2.5 packs daily. He previously smoked marijuana, though denied any other illegal drugs and had a negative UDS in the hospital. He rarely drinks etoh, though when he does he will drink 1/2 a fifth.   He presented to [facility] on the morning of 4/9 with complaints of a 30 second episode of chest discomfort. He denied this being chest pain. His BP was found to be significantly elevated at 190/105 upon admission. His first troponin was 0.15 to a peak of 0.37-->0.27, EKG at 7:24 AM showed sinus rhythm, 98 bpm, nonspecific inferolateral st/t changes. Repeat EKG at 8:44 AM showed sinus rhythm, 88 bpm, nonspecific inferolateral st elevation without reciprocal changes. Stat EKG was ordered by cardiology upon consultation given the above changes which showed improvement in inferolateral st/t changes. CXR non-acute. Echo showed EF 55-65%, no RWMA, mild LVH,  LV diastolic function was normal, LA normal in size, PASP normal, technically difficult study 2/2 patient's body habitus. He underwent cardiac cath on 4/10 that showed proximal RCA 95% stenosed s/p PCI/DES; otherwise coronary anatomy was angiographically normal. LV systolic function 55-65%, no WMA, no MS/AS. He was placed on aspirin 81 mg, Brilinta 90 mg bid, Coreg 12.5 mg, and amlodipine (though the discharge summary does not indicate this in the meds section where the patient would actually see his medication list for home usage.  Today, he reports feeling overall well. He has not had any further chest pain, either at rest or with exertion. However, he has not really exerted himself as he wanted to see cardiology first. He does note some SOB when talking on the phone. No SOB otherwise. Weight has been stable. No LE edema, orthopnea, early satiety, PND, or cough. He has not missed any doses of his aspirin 81 mg daily or Brilinta 90 mg bid. His BP is much improved today compared to his prior baseline of 160s systolic. He has decreased his tobacco usage from 1.5-2.5 daily to 1-2 cigarettes on 4/19 and 4/20. 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    Past Medical History  Diagnosis Date  . Hypertension   . Hyperlipidemia   . NSTEMI (non-ST elevated myocardial infarction) (HCC) 07/30/2015  . Coronary artery disease involving native coronary artery of native heart with angina pectoris with documented spasm (HCC)     a. NSTEMI 07/30/2015; b. cardiac cath: 95% stenosed pRCA s/p PCI/DES, o/w angiographically nl coronary  arteries, LVSF 55-65%, no MS/AS  . Tobacco abuse   . H/O medication noncompliance   . LVH (left ventricular hypertrophy)     a. echo 07/30/2015: EF 55-65%, no RWMA, LV diastolic fxn nl, LA nl in size, PASP nl, technically difficult study 2/2 habitus    Past Surgical History  Procedure Laterality Date  . Cardiac catheterization N/A 07/31/2015    Procedure: Left Heart Cath and Coronary  Angiography;  Surgeon: Iran Ouch, MD;  Location: [facility] INVASIVE CV LAB;  Service: Cardiovascular;  Laterality: N/A;  . Cardiac catheterization N/A 07/31/2015    Procedure: Coronary Stent Intervention;  Surgeon: Iran Ouch, MD;  Location: [facility] INVASIVE CV LAB;  Service: Cardiovascular;  Laterality: N/A;    Current Outpatient Prescriptions  Medication Sig Dispense Refill  . acetaminophen (TYLENOL) 325 MG tablet Take 2 tablets (650 mg total) by mouth every 6 (six) hours as needed for mild pain (or Fever >/= 101).    Marland Kitchen amLODipine (NORVASC) 5 MG tablet Take 1 tablet (5 mg total) by mouth daily. 30 tablet 0  . aspirin EC 81 MG EC tablet Take 1 tablet (81 mg total) by mouth daily.    Marland Kitchen atorvastatin (LIPITOR) 80 MG tablet Take 1 tablet (80 mg total) by mouth daily at 6 PM. 30 tablet 0  . carvedilol (COREG) 12.5 MG tablet Take 1 tablet (12.5 mg total) by mouth 2 (two) times daily with a meal. 60 tablet 0  . nitroGLYCERIN (NITROSTAT) 0.4 MG SL tablet Place 1 tablet (0.4 mg total) under the tongue every 5 (five) minutes as needed for chest pain. 10 tablet 0  . ticagrelor (BRILINTA) 90 MG TABS tablet Take 1 tablet (90 mg total) by mouth 2 (two) times daily. 60 tablet 0   No current facility-administered medications for this visit.    Allergies:   Eggs or egg-derived products   Social History:  The patient  reports that he quit smoking 12 days ago. His smoking use included Cigarettes. He has a 39 pack-year smoking history. His smokeless tobacco use includes Chew. He reports that he drinks alcohol. He reports that he does not use illicit drugs.   Family History:  The patient's family history includes Diabetes in his mother; Hypertension in his mother.  ROS:   Review of Systems  Constitutional: Negative for fever, chills, weight loss, malaise/fatigue and diaphoresis.  HENT: Positive for nosebleeds. Negative for congestion.   Eyes: Negative for discharge and redness.  Respiratory: Positive  for shortness of breath. Negative for cough, sputum production and wheezing.        SOB when talking on the phone  Cardiovascular: Negative for chest pain, palpitations, orthopnea, claudication, leg swelling and PND.  Gastrointestinal: Negative for nausea, vomiting and abdominal pain.  Musculoskeletal: Negative for falls.  Skin: Negative for rash.  Neurological: Negative for sensory change, speech change, focal weakness, loss of consciousness and weakness.  Endo/Heme/Allergies: Does not bruise/bleed easily.  Psychiatric/Behavioral: Positive for substance abuse. The patient is not nervous/anxious.        Decreased tobacco abuse from 1.5-2.5 ppd to 1-2 cigarettes on 4/19 and 4/20  All other systems reviewed and are negative.    PHYSICAL EXAM:  VS:  BP 128/82 mmHg  Pulse 83  Ht 6' (1.829 m)  Wt 275 lb 8 oz (124.966 kg)  BMI 37.36 kg/m2 BMI: Body mass index is 37.36 kg/(m^2). Well nourished, well developed, in no acute distress HEENT: normocephalic, atraumatic Neck: no JVD, carotid bruits or masses Cardiac:  normal S1, S2; RRR; no murmurs, rubs, or gallops Lungs:  clear to auscultation bilaterally, no wheezing, rhonchi or rales Abd: obese, soft, nontender, no hepatomegaly, + BS MS: no deformity or atrophy Ext: no edema, cath site well healed with 2+ distal pulse  Skin: warm and dry, no rash Neuro:  moves all extremities spontaneously, no focal abnormalities noted, follows commands Psych: euthymic mood, full affect   EKG:  Was ordered today. Shows NSR, 84 bpm, PR 140 msec, QRS 82 msec, QTc 399 msec, nonspecific inferior st/t changes  Recent Labs: 07/30/2015: ALT 22 08/01/2015: BUN 14; Creatinine, Ser 1.07; Hemoglobin 16.3; Platelets 237; Potassium 4.0; Sodium 134*  07/31/2015: Cholesterol 253*; HDL 43; LDL Cholesterol 156*; Total CHOL/HDL Ratio 5.9; Triglycerides 269*; VLDL 54*   Estimated Creatinine Clearance: 124.1 mL/min (by C-G formula based on Cr of 1.07).   Wt Readings from  Last 3 Encounters:  08/10/15 275 lb 8 oz (124.966 kg)  07/30/15 272 lb 6.4 oz (123.56 kg)  11/25/14 274 lb 8 oz (124.512 kg)     Other studies reviewed: Additional studies/records reviewed today include: summarized above  ASSESSMENT AND PLAN:  1. CAD s/p PCI as above: No angina. Continue DAPT for at least the next 12 months with aspirin 81 mg daily and Brilinta 90 mg bid. Coreg 12.5 mg bid. Lipitor 80 mg daily. He declines cardiac rehab at this time given his current schedule. He will slowly introduce activities at home.   2. HTN: Much improved on current regimen and medication compliance. Continue Coreg 12.5 mg bid. Look to titrate up to 25 mg bid at next visit if SOB is resolved. Continue amlodipine.   3. HLD: Continue Lipitor 80 mg daily. Order placed for FLP and LFTs in 6 weeks.   4. Tobacco abuse: Much improved. Continued cessation advised.   5. Obesity: Weight loss advised. Declines cardiac rehab as above.   6. SOB: Possibly related to Brilinta as this is a known adverse effect of this medication and generally gets better within the first couple of months. Patient could take medication with a few sips of caffeine in an effort to help with symptoms. If symptoms persist he will let us know, could possibly change to Effient if ok with interventional cardiology. Will also check an echo.  7. History of DOT license: Per [employer] guidelines reviewed online, patient will need to be out of work for minimum of 2 months given NSTEMI s/p PCI as above as long as he is asymptomatic and after successful completion of post MI stress echo with adequate LV systolic function of >40%.   Disposition: F/u with me or Dr. Kirke CorinArida, MD in approximately 2 months, s/p stress echo  Current medicines are reviewed at length with the patient today.  The patient did not have any concerns regarding medicines.  Elinor DodgeSigned, Markeya Mincy PA-C 08/10/2015 1:51 PM     [facility] [address] [place], [place] [address] [phone]

## 2015-08-11 ENCOUNTER — Encounter: Payer: Self-pay | Admitting: Cardiovascular Disease

## 2015-08-11 ENCOUNTER — Telehealth: Payer: Self-pay | Admitting: Physician Assistant

## 2015-08-11 ENCOUNTER — Other Ambulatory Visit: Payer: Self-pay

## 2015-08-11 DIAGNOSIS — I252 Old myocardial infarction: Secondary | ICD-10-CM

## 2015-08-11 LAB — CBC WITH DIFFERENTIAL/PLATELET
BASOS ABS: 0 10*3/uL (ref 0.0–0.2)
Basos: 1 %
EOS (ABSOLUTE): 0.1 10*3/uL (ref 0.0–0.4)
Eos: 2 %
Hematocrit: 49.3 % (ref 37.5–51.0)
Hemoglobin: 17.1 g/dL (ref 12.6–17.7)
IMMATURE GRANS (ABS): 0 10*3/uL (ref 0.0–0.1)
IMMATURE GRANULOCYTES: 1 %
LYMPHS: 30 %
Lymphocytes Absolute: 1.7 10*3/uL (ref 0.7–3.1)
MCH: 31.4 pg (ref 26.6–33.0)
MCHC: 34.7 g/dL (ref 31.5–35.7)
MCV: 91 fL (ref 79–97)
MONOS ABS: 0.6 10*3/uL (ref 0.1–0.9)
Monocytes: 10 %
NEUTROS PCT: 56 %
Neutrophils Absolute: 3.3 10*3/uL (ref 1.4–7.0)
PLATELETS: 354 10*3/uL (ref 150–379)
RBC: 5.44 x10E6/uL (ref 4.14–5.80)
RDW: 13.9 % (ref 12.3–15.4)
WBC: 5.8 10*3/uL (ref 3.4–10.8)

## 2015-08-11 LAB — BASIC METABOLIC PANEL
BUN/Creatinine Ratio: 10 (ref 9–20)
BUN: 11 mg/dL (ref 6–24)
CALCIUM: 9.9 mg/dL (ref 8.7–10.2)
CHLORIDE: 99 mmol/L (ref 96–106)
CO2: 25 mmol/L (ref 18–29)
Creatinine, Ser: 1.12 mg/dL (ref 0.76–1.27)
GFR calc Af Amer: 94 mL/min/{1.73_m2} (ref >59)
GFR calc non Af Amer: 81 mL/min/{1.73_m2} (ref >59)
GLUCOSE: 87 mg/dL (ref 65–99)
POTASSIUM: 5 mmol/L (ref 3.5–5.2)
Sodium: 140 mmol/L (ref 134–144)

## 2015-08-11 NOTE — Telephone Encounter (Signed)
Per verbal from Dr. Kirke CorinArida, pt only needs GXT for return to work clearance. He does not need echo and stress echo as EF is normal. S/w pt who is agreeable to GXT 5/12 at 8:30am. He understands to hold coreg the morning of the test. Appt scheduled. Information mailed to pt.

## 2015-08-11 NOTE — Patient Instructions (Signed)
Your physician has requested that you have an exercise tolerance test. For further information please visit [email or web address]. Please also follow instruction sheet, as given.  Friday, May 12 8:30am DO NOT TAKE COREG THE MORNING OF YOUR TEST   Exercise Stress Electrocardiogram An exercise stress electrocardiogram is a test that is done to evaluate the blood supply to your heart. This test may also be called exercise stress electrocardiography. The test is done while you are walking on a treadmill. The goal of this test is to raise your heart rate. This test is done to find areas of poor blood flow to the heart by determining the extent of coronary artery disease (CAD).   CAD is defined as narrowing in one or more heart (coronary) arteries of more than 70%. If you have an abnormal test result, this may mean that you are not getting adequate blood flow to your heart during exercise. Additional testing may be needed to understand why your test was abnormal. LET [facility] KNOW ABOUT:   Any allergies you have.  All medicines you are taking, including vitamins, herbs, eye drops, creams, and over-the-counter medicines.  Previous problems you or members of your family have had with the use of anesthetics.  Any blood disorders you have.  Previous surgeries you have had.  Medical conditions you have.  Possibility of pregnancy, if this applies. RISKS AND COMPLICATIONS Generally, this is a safe procedure. However, as with any procedure, complications can occur. Possible complications can include:  Pain or pressure in the following areas:  Chest.  Jaw or neck.  Between your shoulder blades.  Radiating down your left arm.  Dizziness or light-headedness.  Shortness of breath.  Increased or irregular heartbeats.  Nausea or vomiting.  Heart attack (rare). BEFORE THE PROCEDURE  Avoid all forms of caffeine 24 hours before your test or as directed by your health care  provider. This includes coffee, tea (even decaffeinated tea), caffeinated sodas, chocolate, cocoa, and certain pain medicines.  Follow your health care provider's instructions regarding eating and drinking before the test.  Take your medicines as directed at regular times with water unless instructed otherwise. Exceptions may include:  If you have diabetes, ask how you are to take your insulin or pills. It is common to adjust insulin dosing the morning of the test.  If you are taking beta-blocker medicines, it is important to talk to your health care provider about these medicines well before the date of your test. Taking beta-blocker medicines may interfere with the test. In some cases, these medicines need to be changed or stopped 24 hours or more before the test.  If you wear a nitroglycerin patch, it may need to be removed prior to the test. Ask your health care provider if the patch should be removed before the test.  If you use an inhaler for any breathing condition, bring it with you to the test.  If you are an outpatient, bring a snack so you can eat right after the stress phase of the test.  Do not smoke for 4 hours prior to the test or as directed by your health care provider.  Do not apply lotions, powders, creams, or oils on your chest prior to the test.  Wear loose-fitting clothes and comfortable shoes for the test. This test involves walking on a treadmill. PROCEDURE  Multiple patches (electrodes) will be put on your chest. If needed, small areas of your chest may have to be shaved to get better contact  with the electrodes. Once the electrodes are attached to your body, multiple wires will be attached to the electrodes and your heart rate will be monitored.  Your heart will be monitored both at rest and while exercising.  You will walk on a treadmill. The treadmill will be started at a slow pace. The treadmill speed and incline will gradually be increased to raise your heart  rate. AFTER THE PROCEDURE  Your heart rate and blood pressure will be monitored after the test.  You may return to your normal schedule including diet, activities, and medicines, unless your health care provider tells you otherwise.   This information is not intended to replace advice given to you by your health care provider. Make sure you discuss any questions you have with your health care provider.   Document Released: 04/05/2000 Document Revised: 04/13/2013 Document Reviewed: 12/14/2012 Elsevier Interactive Patient Education [employer]

## 2015-08-11 NOTE — Telephone Encounter (Signed)
Called pt to review lab results. Pt states he wishes to return to work Sunday, April 23. 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 [facility]11 NSTEM s/p PCI.  Per Alycia Rossettiyan Dunn's notes at 4/20 OV: "History of DOT license: Per [employer] guidelines reviewed online, patient will need to be out of work for minimum of 2 months given NSTEMI s/p PCI as above as long as he is asymptomatic and after successful completion of post MI stress echo with adequate LV systolic function of >40%."  Pt states he feels "great" and his employer wants him back sooner than two months.  Pt asks to have Ryan review criteria. As Alycia RossettiRyan is out of the office today, forward to Ward Givenshris Berge to review and advise.

## 2015-08-14 ENCOUNTER — Telehealth: Payer: Self-pay | Admitting: Cardiovascular Disease

## 2015-08-14 NOTE — Telephone Encounter (Signed)
S/w pt and reviewed April 21 discussion. Per Dr. Kirke CorinArida, pt needs GXT one month s/p PCI for return to work clearance. GXT was scheduled for May 12. Pt states he needs to go back to work now or "I'm going to have another heart attack because I can't pay my bills".  Pt admitted 4/9-4/11 for NSTEMI. PCI 4/10. Pt d/c'd 4/11.  States [employer] web site indicates he should be able to return to work 1 week s/p PCI. Informed pt weibsite shows:   "Current [employer] guidelines state that an individual with angina pectoris who has undergone a percutaneous coronary intervention (PCI) may be qualified to drive if he or she meets all the following conditions:  At least one week has passed since the procedure  The treating cardiologist provides approval  The individual has demonstrated tolerance to medictions  The individual has a normal ETT 3 to 6 months following PCI The MEP recommended removing the last of these conditions (normal ETT 3 to 6 months following PCI)."   Advised pt there is no mention in this of return to work regulations s/p MI; only angina. Pt insists he was told by Eula Listenyan Dunn at 4/20 OV that he could return to work now and offered work note if needed. I do not see this documented in his chart.  Will forward to [facility]  for clarification.

## 2015-08-14 NOTE — Telephone Encounter (Signed)
Pt calling needing a letter to go back to work. Pt will like to pick it up Please call when ready.

## 2015-08-14 NOTE — Telephone Encounter (Signed)
Ward Givenshris Berge, NP, reviewed [facility] rules which states pt must be off work 2 months post MI. Reviewed information w/pt who states his employer wants him back sooner. States he will ask employer to contact us. Advised pt I am unsure if MD would be able to clear him sooner. Pt verbalized understanding and is appreciative of the call.

## 2015-08-16 NOTE — Telephone Encounter (Signed)
I did NOT inform patient he could return to work now. I informed him [employer] guidelines were he must be out of work 2 months s/p PCI in the setting of an MI. He must then pass an GXT. I also informed him he needs to discuss this with his employer. The indications he is speaking of regarding returning to work in one week are in the setting of a diagnostic cath leading to PCI, not an MI.   This is [employer]. No exceptions. He can return to work in 2 months IF he passes a GXT. His EF is already >40% s/p MI. He will also need to pass a stress echo every 2 years to be able to continue driving.

## 2015-08-29 ENCOUNTER — Telehealth: Payer: Self-pay | Admitting: Nurse Practitioner

## 2015-08-29 NOTE — Telephone Encounter (Signed)
Baldwin CrownJessie Graham called patient and went over what he needs to bring to the clinic in order to be established. Patient confirmed that it already has the form.

## 2015-08-31 ENCOUNTER — Telehealth: Payer: Self-pay | Admitting: Cardiovascular Disease

## 2015-08-31 NOTE — Telephone Encounter (Signed)
S/w pt to review GXT instructions. Pt verbalized understanding with no questions.

## 2015-09-01 ENCOUNTER — Ambulatory Visit (INDEPENDENT_AMBULATORY_CARE_PROVIDER_SITE_OTHER): Payer: Self-pay

## 2015-09-01 DIAGNOSIS — I252 Old myocardial infarction: Secondary | ICD-10-CM

## 2015-09-01 LAB — EXERCISE TOLERANCE TEST
CHL CUP MPHR: 179 {beats}/min
CSEPEDS: 1 s
CSEPEW: 10.1 METS
Exercise duration (min): 8 min
Peak HR: 148 {beats}/min
Percent HR: 82 %
Rest HR: 94 {beats}/min

## 2015-09-07 ENCOUNTER — Ambulatory Visit: Payer: Self-pay

## 2015-09-08 ENCOUNTER — Other Ambulatory Visit: Payer: Self-pay

## 2015-09-11 ENCOUNTER — Other Ambulatory Visit: Payer: Self-pay

## 2015-09-11 ENCOUNTER — Telehealth: Payer: Self-pay | Admitting: Cardiovascular Disease

## 2015-09-11 MED ORDER — ATORVASTATIN CALCIUM 80 MG PO TABS: 80.0000 mg | ORAL_TABLET | Freq: Every day | ORAL | Status: DC

## 2015-09-11 MED ORDER — ASPIRIN 81 MG PO TBEC: 81.0000 mg | DELAYED_RELEASE_TABLET | Freq: Every day | ORAL | Status: DC

## 2015-09-11 MED ORDER — AMLODIPINE BESYLATE 5 MG PO TABS: 5.0000 mg | ORAL_TABLET | Freq: Every day | ORAL | Status: DC

## 2015-09-11 MED ORDER — CARVEDILOL 12.5 MG PO TABS: 12.5000 mg | ORAL_TABLET | Freq: Two times a day (BID) | ORAL | Status: DC

## 2015-09-11 MED ORDER — TICAGRELOR 90 MG PO TABS: 90.0000 mg | ORAL_TABLET | Freq: Two times a day (BID) | ORAL | Status: DC

## 2015-09-11 MED ORDER — NITROGLYCERIN 0.4 MG SL SUBL: 0.4000 mg | SUBLINGUAL_TABLET | SUBLINGUAL | Status: DC | PRN

## 2015-09-11 NOTE — Telephone Encounter (Signed)
Kristen from [facility] calling asking for refills on Asprin and Carvedilol Pt is coming today to pick this up Please send it in as soon as we can.

## 2015-09-11 NOTE — Telephone Encounter (Signed)
[facility].

## 2015-09-11 NOTE — Telephone Encounter (Signed)
Refill sent carvedilol & aspirin

## 2015-09-11 NOTE — Telephone Encounter (Signed)
Rx printed for amlodipine, atorvastatin, NTG and Brilinta for patient assistance program.

## 2015-09-21 ENCOUNTER — Other Ambulatory Visit: Payer: Self-pay

## 2015-09-21 ENCOUNTER — Other Ambulatory Visit (INDEPENDENT_AMBULATORY_CARE_PROVIDER_SITE_OTHER): Payer: Self-pay

## 2015-09-21 DIAGNOSIS — R0602 Shortness of breath: Secondary | ICD-10-CM

## 2015-09-21 DIAGNOSIS — Z024 Encounter for examination for driving license: Secondary | ICD-10-CM

## 2015-09-21 DIAGNOSIS — I25111 Atherosclerotic heart disease of native coronary artery with angina pectoris with documented spasm: Secondary | ICD-10-CM

## 2015-09-21 DIAGNOSIS — I10 Essential (primary) hypertension: Secondary | ICD-10-CM

## 2015-09-21 DIAGNOSIS — I214 Non-ST elevation (NSTEMI) myocardial infarction: Secondary | ICD-10-CM

## 2015-09-21 DIAGNOSIS — E785 Hyperlipidemia, unspecified: Secondary | ICD-10-CM

## 2015-09-22 ENCOUNTER — Other Ambulatory Visit: Payer: Self-pay

## 2015-09-22 DIAGNOSIS — R7989 Other specified abnormal findings of blood chemistry: Secondary | ICD-10-CM

## 2015-09-22 DIAGNOSIS — R945 Abnormal results of liver function studies: Secondary | ICD-10-CM

## 2015-09-22 LAB — HEPATIC FUNCTION PANEL
ALK PHOS: 69 IU/L (ref 39–117)
ALT: 61 IU/L — AB (ref 0–44)
AST: 56 IU/L — AB (ref 0–40)
Albumin: 4.3 g/dL (ref 3.5–5.5)
BILIRUBIN TOTAL: 0.6 mg/dL (ref 0.0–1.2)
BILIRUBIN, DIRECT: 0.18 mg/dL (ref 0.00–0.40)
Total Protein: 6.9 g/dL (ref 6.0–8.5)

## 2015-09-22 LAB — LIPID PANEL
Chol/HDL Ratio: 4.1 ratio units (ref 0.0–5.0)
Cholesterol, Total: 127 mg/dL (ref 100–199)
HDL: 31 mg/dL — ABNORMAL LOW (ref >39)
LDL Calculated: 77 mg/dL (ref 0–99)
Triglycerides: 94 mg/dL (ref 0–149)
VLDL Cholesterol Cal: 19 mg/dL (ref 5–40)

## 2015-09-22 MED ORDER — ATORVASTATIN CALCIUM 40 MG PO TABS: 40.0000 mg | ORAL_TABLET | Freq: Every day | ORAL | Status: DC

## 2015-09-28 ENCOUNTER — Telehealth: Payer: Self-pay | Admitting: Cardiovascular Disease

## 2015-09-28 NOTE — Telephone Encounter (Signed)
[facility] signature for Brilinta. Placed in MD basket

## 2015-10-06 NOTE — Telephone Encounter (Signed)
S/w Drinda Buttsnnette at [facility], [phone], to inform that pt approval for Brilinta is at the front desk ready for pick up.

## 2015-10-12 ENCOUNTER — Telehealth: Payer: Self-pay | Admitting: Cardiovascular Disease

## 2015-10-12 NOTE — Telephone Encounter (Signed)
Michael from [facility] calling asking about Marden NobleBRILINTA They were helping patient with patient assistance program for medication  And for the [facility], patient makes a bit too much to get approved. They are still going to send it in  But are calling us to be proactive and asking us to see if we can prescribe patient either Effient or Plavix. They feel if we did either of those it would be approved. Please advise.

## 2015-10-12 NOTE — Telephone Encounter (Signed)
Pt's income does not meet requirement for patience assistance program for brilinta. Pharmacy asking about other recommendations. Please advise.

## 2015-10-13 NOTE — Telephone Encounter (Signed)
S/w pt who reports picking up Brilinta yesterday at [facility]. He dropped off pay stubs and thought Brilinta had been approved. [facility] at [facility] requesting a call back.

## 2015-10-13 NOTE — Telephone Encounter (Signed)
Switch to Plavix 75 mg once daily.

## 2015-10-16 ENCOUNTER — Other Ambulatory Visit (INDEPENDENT_AMBULATORY_CARE_PROVIDER_SITE_OTHER): Payer: Self-pay | Admitting: *Deleted

## 2015-10-16 DIAGNOSIS — R7989 Other specified abnormal findings of blood chemistry: Secondary | ICD-10-CM

## 2015-10-16 DIAGNOSIS — R945 Abnormal results of liver function studies: Secondary | ICD-10-CM

## 2015-10-17 LAB — HEPATIC FUNCTION PANEL
ALK PHOS: 63 IU/L (ref 39–117)
ALT: 26 IU/L (ref 0–44)
AST: 24 IU/L (ref 0–40)
Albumin: 4.2 g/dL (ref 3.5–5.5)
BILIRUBIN, DIRECT: 0.14 mg/dL (ref 0.00–0.40)
Bilirubin Total: 0.4 mg/dL (ref 0.0–1.2)
TOTAL PROTEIN: 6.6 g/dL (ref 6.0–8.5)

## 2015-10-17 NOTE — Telephone Encounter (Signed)
S/w Casimiro NeedleMichael, [facility], who reports they gave pt Brilinta samples while waiting approval for medication assistance.  Based on pt income, he anticipates pt will be denied assistance for this medication and asks if MD would prescribe something else if needed. Ruffin Pyodvised Michael that Dr. Kirke CorinArida is agreeable to switch to Plavix 75mg  qd if Brilinta assistance denied. Casimiro NeedleMichael will call back once Brilinta has been approved or denied.

## 2015-10-26 ENCOUNTER — Encounter: Payer: Self-pay | Admitting: Cardiovascular Disease

## 2015-10-26 ENCOUNTER — Ambulatory Visit (INDEPENDENT_AMBULATORY_CARE_PROVIDER_SITE_OTHER): Payer: Self-pay | Admitting: Cardiovascular Disease

## 2015-10-26 VITALS — BP 134/100 | HR 88 | Ht 72.0 in | Wt 272.8 lb

## 2015-10-26 DIAGNOSIS — E785 Hyperlipidemia, unspecified: Secondary | ICD-10-CM

## 2015-10-26 DIAGNOSIS — I25111 Atherosclerotic heart disease of native coronary artery with angina pectoris with documented spasm: Secondary | ICD-10-CM

## 2015-10-26 NOTE — Patient Instructions (Signed)
Medication Instructions:  Your physician recommends that you continue on your current medications as directed. Please refer to the Current Medication list given to you today.   Labwork: none  Testing/Procedures: none  Follow-Up: Your physician wants you to follow-up in: six months with Dr. Kirke Corin.  You will receive a reminder letter in the mail two months in advance. If you don't receive a letter, please call our office to schedule the follow-up appointment.   Any Other Special Instructions Will Be Listed Below (If Applicable).     If you need a refill on your cardiac medications before your next appointment, please call your pharmacy.  Smoking Cessation, Tips for Success If you are ready to quit smoking, congratulations! You have chosen to help yourself be healthier. Cigarettes bring nicotine, tar, carbon monoxide, and other irritants into your body. Your lungs, heart, and blood vessels will be able to work better without these poisons. There are many different ways to quit smoking. Nicotine gum, nicotine patches, a nicotine inhaler, or nicotine nasal spray can help with physical craving. Hypnosis, support groups, and medicines help break the habit of smoking. WHAT THINGS CAN I DO TO MAKE QUITTING EASIER?  Here are some tips to help you quit for good:  Pick a date when you will quit smoking completely. Tell all of your friends and family about your plan to quit on that date.  Do not try to slowly cut down on the number of cigarettes you are smoking. Pick a quit date and quit smoking completely starting on that day.  Throw away all cigarettes.   Clean and remove all ashtrays from your home, work, and car.  On a card, write down your reasons for quitting. Carry the card with you and read it when you get the urge to smoke.  Cleanse your body of nicotine. Drink enough water and fluids to keep your urine clear or pale yellow. Do this after quitting to flush the nicotine from your  body.  Learn to predict your moods. Do not let a bad situation be your excuse to have a cigarette. Some situations in your life might tempt you into wanting a cigarette.  Never have "just one" cigarette. It leads to wanting another and another. Remind yourself of your decision to quit.  Change habits associated with smoking. If you smoked while driving or when feeling stressed, try other activities to replace smoking. Stand up when drinking your coffee. Brush your teeth after eating. Sit in a different chair when you read the paper. Avoid alcohol while trying to quit, and try to drink fewer caffeinated beverages. Alcohol and caffeine may urge you to smoke.  Avoid foods and drinks that can trigger a desire to smoke, such as sugary or spicy foods and alcohol.  Ask people who smoke not to smoke around you.  Have something planned to do right after eating or having a cup of coffee. For example, plan to take a walk or exercise.  Try a relaxation exercise to calm you down and decrease your stress. Remember, you may be tense and nervous for the first 2 weeks after you quit, but this will pass.  Find new activities to keep your hands busy. Play with a pen, coin, or rubber band. Doodle or draw things on paper.  Brush your teeth right after eating. This will help cut down on the craving for the taste of tobacco after meals. You can also try mouthwash.   Use oral substitutes in place of cigarettes. Try  using lemon drops, carrots, cinnamon sticks, or chewing gum. Keep them handy so they are available when you have the urge to smoke.  When you have the urge to smoke, try deep breathing.  Designate your home as a nonsmoking area.  If you are a heavy smoker, ask your health care provider about a prescription for nicotine chewing gum. It can ease your withdrawal from nicotine.  Reward yourself. Set aside the cigarette money you save and buy yourself something nice.  Look for support from others. Join  a support group or smoking cessation program. Ask someone at home or at work to help you with your plan to quit smoking.  Always ask yourself, "Do I need this cigarette or is this just a reflex?" Tell yourself, "Today, I choose not to smoke," or "I do not want to smoke." You are reminding yourself of your decision to quit.  Do not replace cigarette smoking with electronic cigarettes (commonly called e-cigarettes). The safety of e-cigarettes is unknown, and some may contain harmful chemicals.  If you relapse, do not give up! Plan ahead and think about what you will do the next time you get the urge to smoke. HOW WILL I FEEL WHEN I QUIT SMOKING? You may have symptoms of withdrawal because your body is used to nicotine (the addictive substance in cigarettes). You may crave cigarettes, be irritable, feel very hungry, cough often, get headaches, or have difficulty concentrating. The withdrawal symptoms are only temporary. They are strongest when you first quit but will go away within 10-14 days. When withdrawal symptoms occur, stay in control. Think about your reasons for quitting. Remind yourself that these are signs that your body is healing and getting used to being without cigarettes. Remember that withdrawal symptoms are easier to treat than the major diseases that smoking can cause.  Even after the withdrawal is over, expect periodic urges to smoke. However, these cravings are generally short lived and will go away whether you smoke or not. Do not smoke! WHAT RESOURCES ARE AVAILABLE TO HELP ME QUIT SMOKING? Your health care provider can direct you to community resources or hospitals for support, which may include:  Group support.  Education.  Hypnosis.  Therapy.   This information is not intended to replace advice given to you by your health care provider. Make sure you discuss any questions you have with your health care provider.   Document Released: 01/05/2004 Document Revised: 04/29/2014  Document Reviewed: 09/24/2012 Elsevier Interactive Patient Education 2016 [employer]. Steps to Quit Smoking  Smoking tobacco can be harmful to your health and can affect almost every organ in your body. Smoking puts you, and those around you, at risk for developing many serious chronic diseases. Quitting smoking is difficult, but it is one of the best things that you can do for your health. It is never too late to quit. WHAT ARE THE BENEFITS OF QUITTING SMOKING? When you quit smoking, you lower your risk of developing serious diseases and conditions, such as:  Lung cancer or lung disease, such as COPD.  Heart disease.  Stroke.  Heart attack.  Infertility.  Osteoporosis and bone fractures. Additionally, symptoms such as coughing, wheezing, and shortness of breath may get better when you quit. You may also find that you get sick less often because your body is stronger at fighting off colds and infections. If you are pregnant, quitting smoking can help to reduce your chances of having a baby of low birth weight. HOW DO I GET  READY TO QUIT? When you decide to quit smoking, create a plan to make sure that you are successful. Before you quit:  Pick a date to quit. Set a date within the next two weeks to give you time to prepare.  Write down the reasons why you are quitting. Keep this list in places where you will see it often, such as on your bathroom mirror or in your car or wallet.  Identify the people, places, things, and activities that make you want to smoke (triggers) and avoid them. Make sure to take these actions:  Throw away all cigarettes at home, at work, and in your car.  Throw away smoking accessories, such as Set designerashtrays and lighters.  Clean your car and make sure to empty the ashtray.  Clean your home, including curtains and carpets.  Tell your family, friends, and coworkers that you are quitting. Support from your loved ones can make quitting easier.  Talk with your  health care provider about your options for quitting smoking.  Find out what treatment options are covered by your health insurance. WHAT STRATEGIES CAN I USE TO QUIT SMOKING?  Talk with your healthcare provider about different strategies to quit smoking. Some strategies include:  Quitting smoking altogether instead of gradually lessening how much you smoke over a period of time. Research shows that quitting "cold [place]key" is more successful than gradually quitting.  Attending in-person counseling to help you build problem-solving skills. You are more likely to have success in quitting if you attend several counseling sessions. Even short sessions of 10 minutes can be effective.  Finding resources and support systems that can help you to quit smoking and remain smoke-free after you quit. These resources are most helpful when you use them often. They can include:  Online chats with a Veterinary surgeoncounselor.  Telephone quitlines.  Printed Materials engineerself-help materials.  Support groups or group counseling.  Text messaging programs.  Mobile phone applications.  Taking medicines to help you quit smoking. (If you are pregnant or breastfeeding, talk with your health care provider first.) Some medicines contain nicotine and some do not. Both types of medicines help with cravings, but the medicines that include nicotine help to relieve withdrawal symptoms. Your health care provider may recommend:  Nicotine patches, gum, or lozenges.  Nicotine inhalers or sprays.  Non-nicotine medicine that is taken by mouth. Talk with your health care provider about combining strategies, such as taking medicines while you are also receiving in-person counseling. Using these two strategies together makes you more likely to succeed in quitting than if you used either strategy on its own. If you are pregnant or breastfeeding, talk with your health care provider about finding counseling or other support strategies to quit smoking. Do not  take medicine to help you quit smoking unless told to do so by your health care provider. WHAT THINGS CAN I DO TO MAKE IT EASIER TO QUIT? Quitting smoking might feel overwhelming at first, but there is a lot that you can do to make it easier. Take these important actions:  Reach out to your family and friends and ask that they support and encourage you during this time. Call telephone quitlines, reach out to support groups, or work with a counselor for support.  Ask people who smoke to avoid smoking around you.  Avoid places that trigger you to smoke, such as bars, parties, or smoke-break areas at work.  Spend time around people who do not smoke.  Lessen stress in your life, because stress  can be a smoking trigger for some people. To lessen stress, try:  Exercising regularly.  Deep-breathing exercises.  Yoga.  Meditating.  Performing a body scan. This involves closing your eyes, scanning your body from head to toe, and noticing which parts of your body are particularly tense. Purposefully relax the muscles in those areas.  Download or purchase mobile phone or tablet apps (applications) that can help you stick to your quit plan by providing reminders, tips, and encouragement. There are many free apps, such as QuitGuide from the [employer] developer(Centers for Disease Control and Prevention). You can find other support for quitting smoking (smoking cessation) through smokefree.gov and other websites. HOW WILL I FEEL WHEN I QUIT SMOKING? Within the first 24 hours of quitting smoking, you may start to feel some withdrawal symptoms. These symptoms are usually most noticeable 2-3 days after quitting, but they usually do not last beyond 2-3 weeks. Changes or symptoms that you might experience include:  Mood swings.  Restlessness, anxiety, or irritation.  Difficulty concentrating.  Dizziness.  Strong cravings for sugary foods in addition to nicotine.  Mild weight  gain.  Constipation.  Nausea.  Coughing or a sore throat.  Changes in how your medicines work in your body.  A depressed mood.  Difficulty sleeping (insomnia). After the first 2-3 weeks of quitting, you may start to notice more positive results, such as:  Improved sense of smell and taste.  Decreased coughing and sore throat.  Slower heart rate.  Lower blood pressure.  Clearer skin.  The ability to breathe more easily.  Fewer sick days. Quitting smoking is very challenging for most people. Do not get discouraged if you are not successful the first time. Some people need to make many attempts to quit before they achieve long-term success. Do your best to stick to your quit plan, and talk with your health care provider if you have any questions or concerns.   This information is not intended to replace advice given to you by your health care provider. Make sure you discuss any questions you have with your health care provider.   Document Released: 04/02/2001 Document Revised: 08/23/2014 Document Reviewed: 08/23/2014 Elsevier Interactive Patient Education [employer]2016 Elsevier Inc.

## 2015-10-26 NOTE — Progress Notes (Signed)
Cardiology Office Note   Date:  10/26/2015   ID:  Jordan Lerichentonio Keeter, DOB 1975/11/15, MRN [id number]  PCP:  No PCP Per Patient  Cardiologist:   Lorine BearsMuhammad Takeo Harts, MD   Chief Complaint  Patient presents with  . other    2 month f/u c/o joint pain since placed on Atorvastatin and feeling hot and sweating more than usual.  Pt said "He thought that when someone is on a blood thinner your suppose to feel cold and not hot." Meds reviewed verbally with pt.      History of Present Illness: Jordan Maxwell is a 40 y.o. male who presents for a follow-up visit regarding coronary artery disease. He was hospitalized in April 2017 with non-ST elevation myocardial infarction. Echocardiogram showed normal LV systolic function. Cardiac catheterization showed severe one-vessel coronary artery disease involving the right coronary artery. He underwent successful angioplasty and drug-eluting stent placement without complications. He has other chronic medical conditions that include hypertension, hyperlipidemia, tobacco use and obesity. 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 The stress test was normal. He has been doing well and denies any chest pain or shortness of breath. He complains of right arm pain around the elbow which she thinks might be due to atorvastatin. He has no other myalgias symptoms. No orthopnea, PND or leg edema. Unfortunately, he resumed smoking and currently smokes 9-10 cigarettes a day.  Past Medical History  Diagnosis Date  . Hypertension   . Hyperlipidemia   . NSTEMI (non-ST elevated myocardial infarction) (HCC) 07/30/2015  . Coronary artery disease involving native coronary artery of native heart with angina pectoris with documented spasm (HCC)     a. NSTEMI 07/30/2015; b. cardiac cath: 95% stenosed pRCA s/p PCI/DES, o/w angiographically nl coronary arteries, LVSF 55-65%, no MS/AS  . Tobacco abuse   . H/O medication noncompliance   . LVH  (left ventricular hypertrophy)     a. echo 07/30/2015: EF 55-65%, no RWMA, LV diastolic fxn nl, LA nl in size, PASP nl, technically difficult study 2/2 habitus    Past Surgical History  Procedure Laterality Date  . Cardiac catheterization N/A 07/31/2015    Procedure: Left Heart Cath and Coronary Angiography;  Surgeon: Iran OuchMuhammad A Deontaye Civello, MD;  Location: [facility] INVASIVE CV LAB;  Service: Cardiovascular;  Laterality: N/A;  . Cardiac catheterization N/A 07/31/2015    Procedure: Coronary Stent Intervention;  Surgeon: Iran OuchMuhammad A Jahden Schara, MD;  Location: [facility] INVASIVE CV LAB;  Service: Cardiovascular;  Laterality: N/A;     Current Outpatient Prescriptions  Medication Sig Dispense Refill  . acetaminophen (TYLENOL) 325 MG tablet Take 2 tablets (650 mg total) by mouth every 6 (six) hours as needed for mild pain (or Fever >/= 101).    Marland Kitchen. amLODipine (NORVASC) 5 MG tablet Take 1 tablet (5 mg total) by mouth daily. 30 tablet 3  . aspirin 81 MG EC tablet Take 1 tablet (81 mg total) by mouth daily. 30 tablet 3  . atorvastatin (LIPITOR) 40 MG tablet Take 1 tablet (40 mg total) by mouth daily. 30 tablet 3  . carvedilol (COREG) 12.5 MG tablet Take 1 tablet (12.5 mg total) by mouth 2 (two) times daily with a meal. 60 tablet 3  . nitroGLYCERIN (NITROSTAT) 0.4 MG SL tablet Place 1 tablet (0.4 mg total) under the tongue every 5 (five) minutes as needed for chest pain. 10 tablet 0  . ticagrelor (BRILINTA) 90 MG TABS tablet Take 1 tablet (  90 mg total) by mouth 2 (two) times daily. 60 tablet 3   No current facility-administered medications for this visit.    Allergies:   Eggs or egg-derived products    Social History:  The patient  reports that he quit smoking about 2 months ago. His smoking use included Cigarettes. He has a 39 pack-year smoking history. His smokeless tobacco use includes Chew. He reports that he drinks alcohol. He reports that he does not use illicit drugs.   Family History:  The patient's family history  includes Diabetes in his mother; Hypertension in his mother.    ROS:  Please see the history of present illness.   Otherwise, review of systems are positive for none.   All other systems are reviewed and negative.    PHYSICAL EXAM: VS:  BP 134/100 mmHg  Pulse 88  Ht 6' (1.829 m)  Wt 272 lb 12 oz (123.719 kg)  BMI 36.98 kg/m2 , BMI Body mass index is 36.98 kg/(m^2). GEN: Well nourished, well developed, in no acute distress HEENT: normal Neck: no JVD, carotid bruits, or masses Cardiac: RRR; no murmurs, rubs, or gallops,no edema  Respiratory:  clear to auscultation bilaterally, normal work of breathing GI: soft, nontender, nondistended, + BS MS: no deformity or atrophy Skin: warm and dry, no rash Neuro:  Strength and sensation are intact Psych: euthymic mood, full affect   EKG:  EKG is not ordered today.    Recent Labs: 08/01/2015: Hemoglobin 16.3 08/10/2015: BUN 11; Creatinine, Ser 1.12; Platelets 354; Potassium 5.0; Sodium 140 10/16/2015: ALT 26    Lipid Panel    Component Value Date/Time   CHOL 127 09/21/2015 0822   CHOL 253* 07/31/2015 0054   TRIG 94 09/21/2015 0822   HDL 31* 09/21/2015 0822   HDL 43 07/31/2015 0054   CHOLHDL 4.1 09/21/2015 0822   CHOLHDL 5.9 07/31/2015 0054   VLDL 54* 07/31/2015 0054   LDLCALC 77 09/21/2015 0822   LDLCALC 156* 07/31/2015 0054      Wt Readings from Last 3 Encounters:  10/26/15 272 lb 12 oz (123.719 kg)  08/10/15 275 lb 8 oz (124.966 kg)  07/30/15 272 lb 6.4 oz (123.56 kg)       ASSESSMENT AND PLAN:  1.  Coronary artery disease involving native coronary arteries without angina: He is overall doing very well. Continue medical therapy. He is to stay on dual antiplatelet therapy for at least one year. If he is not able to afford Brilinta, then we can switch him to clopidogrel.  2. Essential hypertension: Blood pressure is mildly elevated today but his blood pressure was well-controlled during last visit. I made no changes in  his medications.  3. Hyperlipidemia: His LDL improved from 156-77 atorvastatin 40 mg once daily. I explained to him that his right arm pain is unlikely to be due to atorvastatin.  4. Tobacco use: I again discussed with him the importance of smoking cessation.  5. Obesity: His biggest issue seems to be overconsumption of carbohydrates and processed sugar. This was discussed with him advised him to work on his diet and increase physical activity.   Disposition:   FU with me in 6 months  Signed,  Lorine BearsMuhammad Agapita Savarino, MD  10/26/2015 8:52 AM    [facility]

## 2016-01-10 ENCOUNTER — Telehealth: Payer: Self-pay | Admitting: Cardiovascular Disease

## 2016-01-10 ENCOUNTER — Other Ambulatory Visit: Payer: Self-pay

## 2016-01-10 MED ORDER — CARVEDILOL 12.5 MG PO TABS: 12.5000 mg | ORAL_TABLET | Freq: Two times a day (BID) | ORAL | 3 refills | Status: DC

## 2016-01-10 MED ORDER — TICAGRELOR 90 MG PO TABS
90.0000 mg | ORAL_TABLET | Freq: Two times a day (BID) | ORAL | 3 refills | Status: DC
Start: 2016-01-10 — End: 2016-01-11

## 2016-01-10 MED ORDER — AMLODIPINE BESYLATE 5 MG PO TABS: 5.0000 mg | ORAL_TABLET | Freq: Every day | ORAL | 3 refills | Status: DC

## 2016-01-10 NOTE — Telephone Encounter (Signed)
STAT* If patient is at the pharmacy, call can be transferred to refill team.   1. Which medications need to be refilled? (please list name of each medication and dose if known)  Amlodipine 5 mg  Carvedilol 12.5 mg  Brilinta 90 mg  2. Which pharmacy/location (including street and city if local pharmacy) is medication to be sent to?  [facility] on [address]   3. Do they need a 30 day or 90 day supply? 90

## 2016-01-10 NOTE — Telephone Encounter (Signed)
°*  STAT* If patient is at the pharmacy, call can be transferred to refill team.   1. Which medications need to be refilled? (please list name of each medication and dose if known)  Amlodipine 5 mg  2. Which pharmacy/location (including street and city if local pharmacy) is medication to be sent to?  [facility] on [address]   3. Do they need a 30 day or 90 day supply? 90

## 2016-01-10 NOTE — Telephone Encounter (Signed)
Pt is asking if we can send them in today for he is completley out

## 2016-01-11 ENCOUNTER — Other Ambulatory Visit: Payer: Self-pay | Admitting: *Deleted

## 2016-01-11 ENCOUNTER — Other Ambulatory Visit: Payer: Self-pay

## 2016-01-11 MED ORDER — TICAGRELOR 90 MG PO TABS: 90.0000 mg | ORAL_TABLET | Freq: Two times a day (BID) | ORAL | 3 refills | Status: DC

## 2016-01-11 MED ORDER — CARVEDILOL 12.5 MG PO TABS: 12.5000 mg | ORAL_TABLET | Freq: Two times a day (BID) | ORAL | 3 refills | Status: DC

## 2016-01-11 MED ORDER — AMLODIPINE BESYLATE 5 MG PO TABS: 5.0000 mg | ORAL_TABLET | Freq: Every day | ORAL | 3 refills | Status: DC

## 2016-01-11 NOTE — Telephone Encounter (Signed)
[facility]

## 2016-01-15 ENCOUNTER — Telehealth: Payer: Self-pay | Admitting: Cardiovascular Disease

## 2016-01-15 ENCOUNTER — Other Ambulatory Visit: Payer: Self-pay

## 2016-01-15 MED ORDER — CLOPIDOGREL BISULFATE 75 MG PO TABS: 75.0000 mg | ORAL_TABLET | Freq: Every day | ORAL | 3 refills | Status: DC

## 2016-01-15 NOTE — Telephone Encounter (Addendum)
Iran OuchMuhammad A Arida, MD      7:55 AM  Note    Switch to Plavix 75 mg once daily.      Pt was given permission by Dr. Kirke CorinArida in June to switch brilinta to plavix 75 mg qd as he did not qualify for brilinta patient assistance. This was again noted at 7/6 OV.  Pt called today stating brilinta cost him $500/month. He has been out since Saturday.  Informed pt he can get plavix for $22.84 with GoodRx coupon. He is agreeable to stop Brilinta and start Plavix 75 mg qd. As he will not be able to pick up Plavix at the pharmacy until Wednesday, I have provided pt with one sample bottle of Brilinta, along with Plavix coupon at the front desk for pick up.  Prescription submitted to [facility].   Samples of this drug were given to the patient, quantity 1 bottle, Lot Number [id number], exp 10/19

## 2016-01-15 NOTE — Telephone Encounter (Signed)
Patient called and can't afford his ticagrelor (BRILINTA) 90 MG TABS tablet. Please call patient and he needs some sample.

## 2016-02-13 ENCOUNTER — Encounter: Payer: Self-pay | Admitting: Emergency Medicine

## 2016-02-13 ENCOUNTER — Emergency Department
Admission: EM | Admit: 2016-02-13 | Discharge: 2016-02-13 | Disposition: A | Discharge status: Home / Self Care | Payer: Self-pay | Attending: Emergency Medicine | Admitting: Emergency Medicine

## 2016-02-13 DIAGNOSIS — I10 Essential (primary) hypertension: Secondary | ICD-10-CM | POA: Insufficient documentation

## 2016-02-13 DIAGNOSIS — I214 Non-ST elevation (NSTEMI) myocardial infarction: Secondary | ICD-10-CM | POA: Insufficient documentation

## 2016-02-13 DIAGNOSIS — T7840XA Allergy, unspecified, initial encounter: Secondary | ICD-10-CM | POA: Insufficient documentation

## 2016-02-13 DIAGNOSIS — Z791 Long term (current) use of non-steroidal anti-inflammatories (NSAID): Secondary | ICD-10-CM | POA: Insufficient documentation

## 2016-02-13 DIAGNOSIS — I2510 Atherosclerotic heart disease of native coronary artery without angina pectoris: Secondary | ICD-10-CM | POA: Insufficient documentation

## 2016-02-13 DIAGNOSIS — F17220 Nicotine dependence, chewing tobacco, uncomplicated: Secondary | ICD-10-CM | POA: Insufficient documentation

## 2016-02-13 DIAGNOSIS — Z7982 Long term (current) use of aspirin: Secondary | ICD-10-CM | POA: Insufficient documentation

## 2016-02-13 DIAGNOSIS — Z79899 Other long term (current) drug therapy: Secondary | ICD-10-CM | POA: Insufficient documentation

## 2016-02-13 LAB — CBC WITH DIFFERENTIAL/PLATELET
Basophils Absolute: 0 10*3/uL (ref 0–0.1)
Basophils Relative: 1 %
EOS ABS: 0.1 10*3/uL (ref 0–0.7)
Eosinophils Relative: 2 %
HEMATOCRIT: 49.3 % (ref 40.0–52.0)
HEMOGLOBIN: 16.8 g/dL (ref 13.0–18.0)
LYMPHS ABS: 1.3 10*3/uL (ref 1.0–3.6)
Lymphocytes Relative: 21 %
MCH: 31.3 pg (ref 26.0–34.0)
MCHC: 34.1 g/dL (ref 32.0–36.0)
MCV: 91.8 fL (ref 80.0–100.0)
MONO ABS: 0.5 10*3/uL (ref 0.2–1.0)
MONOS PCT: 9 %
NEUTROS PCT: 67 %
Neutro Abs: 4 10*3/uL (ref 1.4–6.5)
Platelets: 257 10*3/uL (ref 150–440)
RBC: 5.37 MIL/uL (ref 4.40–5.90)
RDW: 13.9 % (ref 11.5–14.5)
WBC: 5.9 10*3/uL (ref 3.8–10.6)

## 2016-02-13 LAB — BASIC METABOLIC PANEL
Anion gap: 8 (ref 5–15)
BUN: 13 mg/dL (ref 6–20)
CHLORIDE: 103 mmol/L (ref 101–111)
CO2: 25 mmol/L (ref 22–32)
CREATININE: 0.9 mg/dL (ref 0.61–1.24)
Calcium: 9.2 mg/dL (ref 8.9–10.3)
GFR calc non Af Amer: >60 mL/min (ref >60)
GLUCOSE: 130 mg/dL — AB (ref 65–99)
Potassium: 3.8 mmol/L (ref 3.5–5.1)
Sodium: 136 mmol/L (ref 135–145)

## 2016-02-13 LAB — BRAIN NATRIURETIC PEPTIDE: B NATRIURETIC PEPTIDE 5: 13 pg/mL (ref 0.0–100.0)

## 2016-02-13 MED ORDER — METHYLPREDNISOLONE SODIUM SUCC 125 MG IJ SOLR
125.0000 mg | Freq: Once | INTRAMUSCULAR | Status: AC
  Administered 2016-02-13: 125 mg via INTRAVENOUS
  Filled 2016-02-13: qty 2

## 2016-02-13 MED ORDER — FAMOTIDINE IN NACL 20-0.9 MG/50ML-% IV SOLN
20.0000 mg | Freq: Once | INTRAVENOUS | Status: AC
  Administered 2016-02-13: 20 mg via INTRAVENOUS
  Filled 2016-02-13: qty 50

## 2016-02-13 MED ORDER — DIPHENHYDRAMINE HCL 50 MG/ML IJ SOLN
50.0000 mg | Freq: Once | INTRAMUSCULAR | Status: AC
  Administered 2016-02-13: 50 mg via INTRAVENOUS
  Filled 2016-02-13: qty 1

## 2016-02-13 MED ORDER — PREDNISONE 10 MG PO TABS: ORAL_TABLET | ORAL | 0 refills | Status: DC

## 2016-02-13 MED ORDER — EPINEPHRINE 0.3 MG/0.3ML IJ SOAJ: INTRAMUSCULAR | 2 refills | Status: DC

## 2016-02-13 NOTE — ED Triage Notes (Signed)
Pt in with co rash and itching throughout since yesterday. Unsure of cause no new exposures.

## 2016-02-13 NOTE — Discharge Instructions (Signed)
You were evaluated for itching rash and swelling of the hands, and it seems like this is an allergic reaction, but unknown trigger.  You were given medications for allergy here in the emergency department. Your blood work and exam are reassuring. We discussed the possibility of early skin infection, but given the fact that things are improving, I think that the spot is a hive rather than skin infection, cellulitis. In any case, if you notice any worsening skin rash, redness, pain, fever, vomiting, you do need to come back to emergency department for reevaluation immediately.  Given the fact that I think you had a system wide allergic reaction, I am going to prescribe an epinephrine auto injector. This is for use if you're having skin rash with vomiting, or any trouble breathing or trouble swallowing due to allergic reaction.  For this episode, pleased take over-the-counter Benadryl 25 mg every 4 hours as needed for itching or skin rash. Take over-the-counter Zantac 150 mg once daily for 3 more days.  You are being prescribed prednisone to help with allergic reaction.

## 2016-02-13 NOTE — ED Provider Notes (Signed)
[facility] Emergency Department Provider Note ____________________________________________   I have reviewed the triage vital signs and the triage nursing note.  HISTORY  Chief Complaint Allergic Reaction   Historian Patient  HPI Loraine Lerichentonio Feasel is a 40 y.o. male with a history of hypertension, and history of heart attack, for which she takes aspirin, clopidogrel, amlodipine, and metoprolol, presents today saying that late yesterday he developed some itching and tenderness to his right flank/chest wall, and it has persisted overnight. He also felt he had a spot on his buttocks. He states that both of his hands seem swollen. He told the triage nurse that he had some tightness in his throat, but for me no trouble swallowing or trouble breathing.  No new medications, exposures including soaps that he knows of. No known history of allergic reactions.  Denies fever, nausea, chest pain, trouble breathing, or vomiting. Denies any abdominal pain.  Symptoms are moderate. It sounds like the itchiness and tenderness are essentially equivalent.    Past Medical History:  Diagnosis Date  . Coronary artery disease involving native coronary artery of native heart with angina pectoris with documented spasm (HCC)    a. NSTEMI 07/30/2015; b. cardiac cath: 95% stenosed pRCA s/p PCI/DES, o/w angiographically nl coronary arteries, LVSF 55-65%, no MS/AS  . H/O medication noncompliance   . Hyperlipidemia   . Hypertension   . LVH (left ventricular hypertrophy)    a. echo 07/30/2015: EF 55-65%, no RWMA, LV diastolic fxn nl, LA nl in size, PASP nl, technically difficult study 2/2 habitus  . NSTEMI (non-ST elevated myocardial infarction) (HCC) 07/30/2015  . Tobacco abuse     Patient Active Problem List   Diagnosis Date Noted  . Coronary artery disease involving native coronary artery of native heart with angina pectoris with documented spasm (HCC)   . Smoker   . Hypertension, accelerated  07/31/2015  . NSTEMI (non-ST elevated myocardial infarction) (HCC) 07/30/2015  . Hypertension goal BP (blood pressure) < 140/90 11/25/2014  . Hyperlipidemia LDL goal <70 11/25/2014  . Obesity, Class II, BMI 35-39.9, with comorbidity 11/25/2014  . Screening for diabetes mellitus (DM) 11/25/2014    Past Surgical History:  Procedure Laterality Date  . CARDIAC CATHETERIZATION N/A 07/31/2015   Procedure: Left Heart Cath and Coronary Angiography;  Surgeon: Iran OuchMuhammad A Arida, MD;  Location: [facility] INVASIVE CV LAB;  Service: Cardiovascular;  Laterality: N/A;  . CARDIAC CATHETERIZATION N/A 07/31/2015   Procedure: Coronary Stent Intervention;  Surgeon: Iran OuchMuhammad A Arida, MD;  Location: [facility] INVASIVE CV LAB;  Service: Cardiovascular;  Laterality: N/A;    Prior to Admission medications   Medication Sig Start Date End Date Taking? Authorizing Provider  acetaminophen (TYLENOL) 325 MG tablet Take 2 tablets (650 mg total) by mouth every 6 (six) hours as needed for mild pain (or Fever >/= 101). 08/01/15  Yes Ramonita LabAruna Gouru, MD  amLODipine (NORVASC) 5 MG tablet Take 1 tablet (5 mg total) by mouth daily. 01/11/16  Yes Iran OuchMuhammad A Arida, MD  aspirin 81 MG EC tablet Take 1 tablet (81 mg total) by mouth daily. 09/11/15  Yes Iran OuchMuhammad A Arida, MD  atorvastatin (LIPITOR) 40 MG tablet Take 1 tablet (40 mg total) by mouth daily. 09/29/15  Yes Ryan M Dunn, PA-C  carvedilol (COREG) 12.5 MG tablet Take 1 tablet (12.5 mg total) by mouth 2 (two) times daily with a meal. 01/11/16  Yes Iran OuchMuhammad A Arida, MD  clopidogrel (PLAVIX) 75 MG tablet Take 1 tablet (75 mg total) by mouth daily. 01/15/16  Yes Iran Ouch, MD  nitroGLYCERIN (NITROSTAT) 0.4 MG SL tablet Place 1 tablet (0.4 mg total) under the tongue every 5 (five) minutes as needed for chest pain. 09/11/15  Yes Iran Ouch, MD  EPINEPHrine (EPIPEN 2-PAK) 0.3 mg/0.3 mL IJ SOAJ injection 0.3 mg into the upper outer thigh when necessary anaphylaxis. May dispense as generic 02/13/16    Governor Rooks, MD  predniSONE (DELTASONE) 10 MG tablet 40mg  daily for 4 days 02/13/16   Governor Rooks, MD    Allergies  Allergen Reactions  . Eggs Or Egg-Derived Products Anaphylaxis    Incident happened as a child    Family History  Problem Relation Age of Onset  . Hypertension Mother   . Diabetes Mother     Social History Social History  Substance Use Topics  . Smoking status: Former Smoker    Packs/day: 1.50    Years: 26.00    Types: Cigarettes    Quit date: 07/29/2015  . Smokeless tobacco: Current User    Types: Chew  . Alcohol use 0.0 oz/week     Comment: ocassionally    Review of Systems  Constitutional: Negative for fever. Eyes: Negative for visual changes. ENT: Negative for sore throat. Cardiovascular: Negative for chest pain. Respiratory: Negative for shortness of breath. Gastrointestinal: Negative for abdominal pain, vomiting and diarrhea. Genitourinary: Negative for dysuria. Musculoskeletal: Negative for back pain. Skin: Redness on the right flank skin area. Neurological: Negative for headache. 10 point Review of Systems otherwise negative ____________________________________________   PHYSICAL EXAM:  VITAL SIGNS: ED Triage Vitals  Enc Vitals Group     BP 02/13/16 0636 (!) 166/101     Pulse Rate 02/13/16 0636 94     Resp 02/13/16 0636 18     Temp 02/13/16 0636 98 F (36.7 C)     Temp Source 02/13/16 0636 Oral     SpO2 02/13/16 0636 98 %     Weight 02/13/16 0637 270 lb (122.5 kg)     Height 02/13/16 0637 6' (1.829 m)     Head Circumference --      Peak Flow --      Pain Score 02/13/16 0637 3     Pain Loc --      Pain Edu? --      Excl. in GC? --      Constitutional: Alert and oriented. Well appearing and in no distress. HEENT   Head: Normocephalic and atraumatic.      Eyes: Conjunctivae are normal. PERRL. Normal extraocular movements.      Ears:         Nose: No congestion/rhinnorhea.   Mouth/Throat: Mucous membranes are  moist.   Neck: No stridor. Cardiovascular/Chest: Normal rate, regular rhythm.  No murmurs, rubs, or gallops. Respiratory: Normal respiratory effort without tachypnea nor retractions. Breath sounds are clear and equal bilaterally. No wheezes/rales/rhonchi. Gastrointestinal: Soft. No distention, no guarding, no rebound. Nontender.  Stretch marks on the skin of the abdomen.  Genitourinary/rectal:Deferred Musculoskeletal: Nontender with normal range of motion in all extremities. No joint effusions.  No lower extremity tenderness.  No edema. Neurologic:  Normal speech and language. No gross or focal neurologic deficits are appreciated. Skin:  Skin is warm, dry and intact.   Slightly red and slightly swollen/indurated patch about palm sized at the right flank area which is itchy and mildly tender to palpation.  No fluctuance or nodule. Psychiatric: Mood and affect are normal. Speech and behavior are normal. Patient exhibits appropriate insight and judgment.  ____________________________________________  LABS (pertinent positives/negatives)  Labs Reviewed  BASIC METABOLIC PANEL - Abnormal; Notable for the following:       Result Value   Glucose, Bld 130 (*)    All other components within normal limits  CBC WITH DIFFERENTIAL/PLATELET  BRAIN NATRIURETIC PEPTIDE    ____________________________________________    EKG I, Governor Rooks, MD, the attending physician have personally viewed and interpreted all ECGs.  None ____________________________________________  RADIOLOGY All Xrays were viewed by me. Imaging interpreted by Radiologist.  None __________________________________________  PROCEDURES  Procedure(s) performed: None  Critical Care performed: None  ____________________________________________   ED COURSE / ASSESSMENT AND PLAN  Pertinent labs & imaging results that were available during my care of the patient were reviewed by me and considered in my medical decision  making (see chart for details).   Mr. Rossman was started on solumedrol, benadryl and pepcid by Dr. Manson Passey prior to my arrival given symptoms concerning for possible allergic reaction, unknown trigger.  On exam, he told me that he had a spot on his buttock, but when he looked there was nothing there. The area on his right flank skin could be some sort of urticaria, however it has a bit of induration and heat raising some concern for possibility of cellulitis here. It is also possible this is a localized allergic reaction to some sort of bite.  He has no systemic symptoms, and although he did initially state to the nurse that he had some tightness in the throat, it sounds like maybe that was more postnasal drainage.     Laboratory studies are reassuring. Reevaluation around 11:30, patient's lesion on his right flank actually feels less firm, and I think at this point is more consistent with hives than it is cellulitis.  I discussed this with the patient were to treat as allergic reaction. He is going be discharged with prednisone, over-the-counter Benadryl and Zantac, and an EpiPen prescription and we discussed how to use this.  Given the presentation is not exactly classic, I have asked him to have very close follow-up and to certainly return if of the spot on his side so she become red, larger, painful, or if he is febrile.    CONSULTATIONS:   None   Patient / Family / Caregiver informed of clinical course, medical decision-making process, and agree with plan.   I discussed return precautions, follow-up instructions, and discharge instructions with patient and/or family.   ___________________________________________   FINAL CLINICAL IMPRESSION(S) / ED DIAGNOSES   Final diagnoses:  Allergic reaction, initial encounter  Essential hypertension              Note: This dictation was prepared with Dragon dictation. Any transcriptional errors that result from this process are  unintentional    Governor Rooks, MD 02/13/16 1147

## 2016-02-13 NOTE — ED Notes (Signed)
Pt states understanding of discharge instructions. NAD noted at this time.  

## 2016-05-07 ENCOUNTER — Telehealth: Payer: Self-pay | Admitting: Cardiovascular Disease

## 2016-05-07 NOTE — Telephone Encounter (Signed)
Patient needs DOT clearance form Completed .  Placed in Nurse Box.   Please call patient when complete and ready for pick up.  Patient has upcoming appt on Thursday 05/09/16 at 815 .

## 2016-05-07 NOTE — Telephone Encounter (Signed)
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

## 2016-05-09 ENCOUNTER — Ambulatory Visit: Payer: Self-pay | Admitting: Cardiovascular Disease

## 2016-05-10 ENCOUNTER — Encounter: Payer: Self-pay | Admitting: Cardiovascular Disease

## 2016-05-10 ENCOUNTER — Ambulatory Visit (INDEPENDENT_AMBULATORY_CARE_PROVIDER_SITE_OTHER): Payer: Self-pay | Admitting: Cardiovascular Disease

## 2016-05-10 VITALS — BP 140/100 | HR 94 | Ht 72.0 in | Wt 282.0 lb

## 2016-05-10 DIAGNOSIS — I10 Essential (primary) hypertension: Secondary | ICD-10-CM

## 2016-05-10 DIAGNOSIS — I2510 Atherosclerotic heart disease of native coronary artery without angina pectoris: Secondary | ICD-10-CM

## 2016-05-10 DIAGNOSIS — Z720 Tobacco use: Secondary | ICD-10-CM

## 2016-05-10 DIAGNOSIS — E785 Hyperlipidemia, unspecified: Secondary | ICD-10-CM

## 2016-05-10 MED ORDER — CARVEDILOL 25 MG PO TABS: 25.0000 mg | ORAL_TABLET | Freq: Two times a day (BID) | ORAL | 5 refills | Status: DC

## 2016-05-10 MED ORDER — ATORVASTATIN CALCIUM 40 MG PO TABS: 40.0000 mg | ORAL_TABLET | Freq: Every day | ORAL | 5 refills | Status: DC

## 2016-05-10 NOTE — Patient Instructions (Signed)
Medication Instructions:  Your physician has recommended you make the following change in your medication:  RESUME atorvastatin 40mg  once daily INCREASE coreg to 25mg  twice daily   Labwork: none  Testing/Procedures: none  Follow-Up: Your physician wants you to follow-up in: 6 months with Dr. Kirke CorinArida.  You will receive a reminder letter in the mail two months in advance. If you don't receive a letter, please call our office to schedule the follow-up appointment.   Any Other Special Instructions Will Be Listed Below (If Applicable).     If you need a refill on your cardiac medications before your next appointment, please call your pharmacy.

## 2016-05-10 NOTE — Progress Notes (Signed)
Cardiology Office Note   Date:  05/10/2016   ID:  Jordan Maxwell, DOB 1976/02/12, MRN [id number]  PCP:  No PCP Per Patient  Cardiologist:   Lorine Bears, MD   Chief Complaint  Patient presents with  . other    6 month follow up. Meds reviewed by the pt. verbally. "doing well."       History of Present Illness: Jordan Maxwell is a 41 y.o. male who presents for a follow-up visit regarding coronary artery disease. He was hospitalized in April 2017 with non-ST elevation myocardial infarction. Echocardiogram showed normal LV systolic function. Cardiac catheterization showed severe one-vessel coronary artery disease involving the right coronary artery. He underwent successful angioplasty and drug-eluting stent placement without complications. He has other chronic medical conditions that include hypertension, hyperlipidemia, tobacco use and obesity. 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 The stress test was normal. He had abnormal LFTs on high dose atorvastatin 80 mg daily which normalized after decreasing the dose to 40 mg daily. He has been doing well and denies any chest pain or shortness of breath. He reports being under significant stress and has gained 12 pounds since his last visit. He ran out of Atorvastatin.     Past Medical History:  Diagnosis Date  . Coronary artery disease involving native coronary artery of native heart with angina pectoris with documented spasm (HCC)    a. NSTEMI 07/30/2015; b. cardiac cath: 95% stenosed pRCA s/p PCI/DES, o/w angiographically nl coronary arteries, LVSF 55-65%, no MS/AS  . H/O medication noncompliance   . Hyperlipidemia   . Hypertension   . LVH (left ventricular hypertrophy)    a. echo 07/30/2015: EF 55-65%, no RWMA, LV diastolic fxn nl, LA nl in size, PASP nl, technically difficult study 2/2 habitus  . NSTEMI (non-ST elevated myocardial infarction) (HCC) 07/30/2015  . Tobacco abuse     Past  Surgical History:  Procedure Laterality Date  . CARDIAC CATHETERIZATION N/A 07/31/2015   Procedure: Left Heart Cath and Coronary Angiography;  Surgeon: Iran Ouch, MD;  Location: [facility] INVASIVE CV LAB;  Service: Cardiovascular;  Laterality: N/A;  . CARDIAC CATHETERIZATION N/A 07/31/2015   Procedure: Coronary Stent Intervention;  Surgeon: Iran Ouch, MD;  Location: [facility] INVASIVE CV LAB;  Service: Cardiovascular;  Laterality: N/A;     Current Outpatient Prescriptions  Medication Sig Dispense Refill  . acetaminophen (TYLENOL) 325 MG tablet Take 2 tablets (650 mg total) by mouth every 6 (six) hours as needed for mild pain (or Fever >/= 101).    Marland Kitchen amLODipine (NORVASC) 5 MG tablet Take 1 tablet (5 mg total) by mouth daily. 90 tablet 3  . aspirin 81 MG EC tablet Take 1 tablet (81 mg total) by mouth daily. 30 tablet 3  . carvedilol (COREG) 12.5 MG tablet Take 1 tablet (12.5 mg total) by mouth 2 (two) times daily with a meal. 90 tablet 3  . clopidogrel (PLAVIX) 75 MG tablet Take 1 tablet (75 mg total) by mouth daily. 30 tablet 3  . EPINEPHrine (EPIPEN 2-PAK) 0.3 mg/0.3 mL IJ SOAJ injection 0.3 mg into the upper outer thigh when necessary anaphylaxis. May dispense as generic 1 Device 2  . nitroGLYCERIN (NITROSTAT) 0.4 MG SL tablet Place 1 tablet (0.4 mg total) under the tongue every 5 (five) minutes as needed for chest pain. 10 tablet 0   No current facility-administered medications for this visit.     Allergies:  Eggs or egg-derived products    Social History:  The patient  reports that he has quit smoking. His smoking use included Cigarettes. He has a 39.00 pack-year smoking history. His smokeless tobacco use includes Chew. He reports that he drinks alcohol. He reports that he does not use drugs.   Family History:  The patient's family history includes Diabetes in his mother; Hypertension in his mother.    ROS:  Please see the history of present illness.   Otherwise, review of systems  are positive for none.   All other systems are reviewed and negative.    PHYSICAL EXAM: VS:  BP (!) 140/100 (BP Location: Left Arm, Patient Position: Sitting, Cuff Size: Large)   Pulse 94   Ht 6' (1.829 m)   Wt 282 lb (127.9 kg)   BMI 38.25 kg/m  , BMI Body mass index is 38.25 kg/m. GEN: Well nourished, well developed, in no acute distress  HEENT: normal  Neck: no JVD, carotid bruits, or masses Cardiac: RRR; no murmurs, rubs, or gallops,no edema  Respiratory:  clear to auscultation bilaterally, normal work of breathing GI: soft, nontender, nondistended, + BS MS: no deformity or atrophy  Skin: warm and dry, no rash Neuro:  Strength and sensation are intact Psych: euthymic mood, full affect   EKG:  EKG is ordered today. EKG showed normal sinus rhythm with nonspecific ST and T wave changes.   Recent Labs: 10/16/2015: ALT 26 02/13/2016: B Natriuretic Peptide 13.0; BUN 13; Creatinine, Ser 0.90; Hemoglobin 16.8; Platelets 257; Potassium 3.8; Sodium 136    Lipid Panel    Component Value Date/Time   CHOL 127 09/21/2015 0822   TRIG 94 09/21/2015 0822   HDL 31 (L) 09/21/2015 0822   CHOLHDL 4.1 09/21/2015 0822   CHOLHDL 5.9 07/31/2015 0054   VLDL 54 (H) 07/31/2015 0054   LDLCALC 77 09/21/2015 0822      Wt Readings from Last 3 Encounters:  05/10/16 282 lb (127.9 kg)  02/13/16 270 lb (122.5 kg)  10/26/15 272 lb 12 oz (123.7 kg)       ASSESSMENT AND PLAN:  1.  Coronary artery disease involving native coronary arteries without angina: He is overall doing very well. Continue medical therapy. He is to stay on dual antiplatelet therapy for at least one year. I discussed with him the importance of healthy lifestyle changes.  2. Essential hypertension: Blood pressure is elevated today. I discussed with him the importance of weight loss and low sodium diet. I increased the dose of carvedilol to 25 mg twice daily.  3. Hyperlipidemia:  Continue treatment with atorvastatin 40 mg  once daily. The 80 mg dose caused abnormal LFTs.  4. Tobacco use: I again discussed with him the importance of smoking cessation.    Disposition:   FU with me in 6 months  Signed,  Lorine BearsMuhammad Arida, MD  05/10/2016 2:07 PM    [facility]

## 2016-05-17 ENCOUNTER — Telehealth: Payer: Self-pay | Admitting: Cardiovascular Disease

## 2016-05-17 ENCOUNTER — Other Ambulatory Visit: Payer: Self-pay | Admitting: *Deleted

## 2016-05-17 MED ORDER — CLOPIDOGREL BISULFATE 75 MG PO TABS: 75.0000 mg | ORAL_TABLET | Freq: Every day | ORAL | 3 refills | Status: DC

## 2016-05-17 NOTE — Telephone Encounter (Signed)
°*  STAT* If patient is at the pharmacy, call can be transferred to refill team.   1. Which medications need to be refilled? (please list name of each medication and dose if known) Clopidogrel 75 mg po  Daily   2. Which pharmacy/location (including street and city if local pharmacy) is medication to be sent to?  [facility] [address] [place]    3. Do they need a 30 day or 90 day supply? 90   Out of medication

## 2016-05-17 NOTE — Telephone Encounter (Signed)
[facility] #[facility] R#3 [facility]

## 2016-05-21 ENCOUNTER — Emergency Department
Admission: EM | Admit: 2016-05-21 | Discharge: 2016-05-21 | Disposition: A | Discharge status: Home / Self Care | Payer: No Typology Code available for payment source | Attending: Emergency Medicine | Admitting: Emergency Medicine

## 2016-05-21 ENCOUNTER — Emergency Department: Payer: No Typology Code available for payment source

## 2016-05-21 ENCOUNTER — Encounter: Payer: Self-pay | Admitting: Emergency Medicine

## 2016-05-21 ENCOUNTER — Ambulatory Visit: Payer: Self-pay | Admitting: Cardiovascular Disease

## 2016-05-21 DIAGNOSIS — F17220 Nicotine dependence, chewing tobacco, uncomplicated: Secondary | ICD-10-CM | POA: Diagnosis not present

## 2016-05-21 DIAGNOSIS — Y9389 Activity, other specified: Secondary | ICD-10-CM | POA: Diagnosis not present

## 2016-05-21 DIAGNOSIS — M79601 Pain in right arm: Secondary | ICD-10-CM | POA: Insufficient documentation

## 2016-05-21 DIAGNOSIS — Z7982 Long term (current) use of aspirin: Secondary | ICD-10-CM | POA: Insufficient documentation

## 2016-05-21 DIAGNOSIS — S199XXA Unspecified injury of neck, initial encounter: Secondary | ICD-10-CM | POA: Diagnosis present

## 2016-05-21 DIAGNOSIS — Z79899 Other long term (current) drug therapy: Secondary | ICD-10-CM | POA: Insufficient documentation

## 2016-05-21 DIAGNOSIS — I10 Essential (primary) hypertension: Secondary | ICD-10-CM | POA: Insufficient documentation

## 2016-05-21 DIAGNOSIS — I2510 Atherosclerotic heart disease of native coronary artery without angina pectoris: Secondary | ICD-10-CM | POA: Insufficient documentation

## 2016-05-21 DIAGNOSIS — R0789 Other chest pain: Secondary | ICD-10-CM | POA: Diagnosis not present

## 2016-05-21 DIAGNOSIS — Y92410 Unspecified street and highway as the place of occurrence of the external cause: Secondary | ICD-10-CM | POA: Diagnosis not present

## 2016-05-21 DIAGNOSIS — M542 Cervicalgia: Secondary | ICD-10-CM | POA: Diagnosis not present

## 2016-05-21 DIAGNOSIS — Y999 Unspecified external cause status: Secondary | ICD-10-CM | POA: Insufficient documentation

## 2016-05-21 MED ORDER — TRAMADOL HCL 50 MG PO TABS: 50.0000 mg | ORAL_TABLET | Freq: Four times a day (QID) | ORAL | 0 refills | Status: AC | PRN

## 2016-05-21 MED ORDER — CYCLOBENZAPRINE HCL 5 MG PO TABS: 5.0000 mg | ORAL_TABLET | Freq: Three times a day (TID) | ORAL | 0 refills | Status: AC | PRN

## 2016-05-21 MED ORDER — OXYCODONE-ACETAMINOPHEN 5-325 MG PO TABS
1.0000 | ORAL_TABLET | Freq: Once | ORAL | Status: AC
  Administered 2016-05-21: 1 via ORAL
  Filled 2016-05-21: qty 1
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
## 2016-11-20 ENCOUNTER — Telehealth: Payer: Self-pay | Admitting: Cardiovascular Disease

## 2016-11-20 ENCOUNTER — Other Ambulatory Visit: Payer: Self-pay

## 2016-11-20 MED ORDER — ATORVASTATIN CALCIUM 40 MG PO TABS: 40.0000 mg | ORAL_TABLET | Freq: Every day | ORAL | 0 refills | Status: DC

## 2016-11-20 MED ORDER — CARVEDILOL 25 MG PO TABS: 25.0000 mg | ORAL_TABLET | Freq: Two times a day (BID) | ORAL | 0 refills | Status: DC

## 2016-11-20 NOTE — Telephone Encounter (Signed)
°*  STAT* If patient is at the pharmacy, call can be transferred to refill team.   1. Which medications need to be refilled? (please list name of each medication and dose if known)   Atorvastatin 40 mg po daily   Carvedilol 25 mg po BID   2. Which pharmacy/location (including street and city if local pharmacy) is medication to be sent to?  [facility] [address] [place]     3. Do they need a 30 day or 90 day supply? 30   Patient is out of medication

## 2016-11-20 NOTE — Telephone Encounter (Signed)
No answer. Left message to call back.   

## 2016-11-20 NOTE — Telephone Encounter (Signed)
Requested Prescriptions   Signed Prescriptions Disp Refills  . atorvastatin (LIPITOR) 40 MG tablet 30 tablet 0    Sig: Take 1 tablet (40 mg total) by mouth daily.    Authorizing Provider: Lorine BearsARIDA, MUHAMMAD A    Ordering User: Margrett RudSLAYTON, Jagger Demonte N  . carvedilol (COREG) 25 MG tablet 60 tablet 0    Sig: Take 1 tablet (25 mg total) by mouth 2 (two) times daily.    Authorizing Provider: Lorine BearsARIDA, MUHAMMAD A    Ordering User: Margrett RudSLAYTON, Brandelyn Henne N
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
## 2016-11-21 NOTE — Telephone Encounter (Signed)
Spoke w/ pt.  He reports that his joints hurt, esp his finger, after taking his Plavix. He has ins now and would like to know the cost if he were to go back on Brilinta. Advised him to call his pharmacy and see if they can tell him much this would cost for him, and to see if this is a potential side effect of Plavix.  He is agreeable and will call back if he would like switch medications.

## 2016-12-24 ENCOUNTER — Telehealth: Payer: Self-pay | Admitting: Cardiovascular Disease

## 2016-12-24 ENCOUNTER — Other Ambulatory Visit: Payer: Self-pay

## 2016-12-24 MED ORDER — AMLODIPINE BESYLATE 5 MG PO TABS: 5.0000 mg | ORAL_TABLET | Freq: Every day | ORAL | 3 refills | Status: DC

## 2016-12-24 MED ORDER — CARVEDILOL 25 MG PO TABS: 25.0000 mg | ORAL_TABLET | Freq: Two times a day (BID) | ORAL | 0 refills | Status: DC

## 2016-12-24 MED ORDER — ATORVASTATIN CALCIUM 40 MG PO TABS: 40.0000 mg | ORAL_TABLET | Freq: Every day | ORAL | 0 refills | Status: DC

## 2016-12-24 NOTE — Telephone Encounter (Signed)
Requested Prescriptions   Signed Prescriptions Disp Refills  . amLODipine (NORVASC) 5 MG tablet 90 tablet 3    Sig: Take 1 tablet (5 mg total) by mouth daily.    Authorizing Provider: Lorine BearsARIDA, MUHAMMAD A    Ordering User: Margrett RudSLAYTON, Retina Bernardy N  . atorvastatin (LIPITOR) 40 MG tablet 90 tablet 0    Sig: Take 1 tablet (40 mg total) by mouth daily.    Authorizing Provider: Lorine BearsARIDA, MUHAMMAD A    Ordering User: Margrett RudSLAYTON, Kenzlie Disch N  . carvedilol (COREG) 25 MG tablet 180 tablet 0    Sig: Take 1 tablet (25 mg total) by mouth 2 (two) times daily.    Authorizing Provider: Lorine BearsARIDA, MUHAMMAD A    Ordering User: Margrett RudSLAYTON, Zuleika Gallus N

## 2016-12-24 NOTE — Telephone Encounter (Signed)
Requested Prescriptions   Signed Prescriptions Disp Refills  . amLODipine (NORVASC) 5 MG tablet 90 tablet 3    Sig: Take 1 tablet (5 mg total) by mouth daily.    Authorizing Provider: ARIDA, MUHAMMAD A    Ordering User: Witney Huie N  . atorvastatin (LIPITOR) 40 MG tablet 90 tablet 0    Sig: Take 1 tablet (40 mg total) by mouth daily.    Authorizing Provider: ARIDA, MUHAMMAD A    Ordering User: Tobiah Celestine N  . carvedilol (COREG) 25 MG tablet 180 tablet 0    Sig: Take 1 tablet (25 mg total) by mouth 2 (two) times daily.    Authorizing Provider: ARIDA, MUHAMMAD A    Ordering User: Byren Pankow N    

## 2016-12-24 NOTE — Telephone Encounter (Signed)
°*  STAT* If patient is at the pharmacy, call can be transferred to refill team.   1. Which medications need to be refilled? (please list name of each medication and dose if known)   AMLODIPINE 5 MG PO Q DAY  ATORVASTATIN 40 MG PO Q DAY  CARVERDILOL 25 MG PO BID    2. Which pharmacy/location (including street and city if local pharmacy) is medication to be sent to? [facility] [address]   3. Do they need a 30 day or 90 day supply? 90

## 2017-01-24 ENCOUNTER — Ambulatory Visit (INDEPENDENT_AMBULATORY_CARE_PROVIDER_SITE_OTHER): Payer: BC Managed Care – PPO | Admitting: Cardiovascular Disease

## 2017-01-24 ENCOUNTER — Encounter: Payer: Self-pay | Admitting: Cardiovascular Disease

## 2017-01-24 VITALS — BP 130/98 | HR 80 | Ht 72.0 in | Wt 285.2 lb

## 2017-01-24 DIAGNOSIS — I2510 Atherosclerotic heart disease of native coronary artery without angina pectoris: Secondary | ICD-10-CM

## 2017-01-24 DIAGNOSIS — Z720 Tobacco use: Secondary | ICD-10-CM | POA: Diagnosis not present

## 2017-01-24 DIAGNOSIS — E7800 Pure hypercholesterolemia, unspecified: Secondary | ICD-10-CM | POA: Diagnosis not present

## 2017-01-24 DIAGNOSIS — E785 Hyperlipidemia, unspecified: Secondary | ICD-10-CM | POA: Diagnosis not present

## 2017-01-24 DIAGNOSIS — I10 Essential (primary) hypertension: Secondary | ICD-10-CM | POA: Diagnosis not present

## 2017-01-24 MED ORDER — CARVEDILOL 25 MG PO TABS: 25.0000 mg | ORAL_TABLET | Freq: Two times a day (BID) | ORAL | 1 refills | Status: DC

## 2017-01-24 MED ORDER — ATORVASTATIN CALCIUM 40 MG PO TABS: 40.0000 mg | ORAL_TABLET | Freq: Every day | ORAL | 1 refills | Status: DC

## 2017-01-24 NOTE — Progress Notes (Signed)
Cardiology Office Note   Date:  01/24/2017   ID:  Jordan Maxwell, DOB [birth year], MRN [id number]  PCP:  Patient, No Pcp Per  Cardiologist:   Lorine Bears, MD   Chief Complaint  Patient presents with  . other    6 month follow up. Meds reviewed by the pt. verbally. "doing well."       History of Present Illness: Jordan Maxwell is a 41 y.o. male who presents for a follow-up visit regarding coronary artery disease. He was hospitalized in April 2017 with non-ST elevation myocardial infarction. Echocardiogram showed normal LV systolic function. Cardiac catheterization showed severe one-vessel coronary artery disease involving the right coronary artery. He underwent successful angioplasty and drug-eluting stent placement without complications. He has other chronic medical conditions that include hypertension, hyperlipidemia, tobacco use and obesity. 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  He had abnormal LFTs on high dose atorvastatin 80 mg daily which normalized after decreasing the dose to 40 mg daily.  He has been doing well and denies any chest pain, shortness of breath or palpitations. Unfortunately, he has not been able to lose any weight. He reports taking his medications regularly.   Past Medical History:  Diagnosis Date  . Coronary artery disease involving native coronary artery of native heart with angina pectoris with documented spasm (HCC)    a. NSTEMI 07/30/2015; b. cardiac cath: 95% stenosed pRCA s/p PCI/DES, o/w angiographically nl coronary arteries, LVSF 55-65%, no MS/AS  . H/O medication noncompliance   . Hyperlipidemia   . Hypertension   . LVH (left ventricular hypertrophy)    a. echo 07/30/2015: EF 55-65%, no RWMA, LV diastolic fxn nl, LA nl in size, PASP nl, technically difficult study 2/2 habitus  . NSTEMI (non-ST elevated myocardial infarction) (HCC) 07/30/2015  . Tobacco abuse     Past Surgical  History:  Procedure Laterality Date  . CARDIAC CATHETERIZATION N/A 07/31/2015   Procedure: Left Heart Cath and Coronary Angiography;  Surgeon: Iran Ouch, MD;  Location: [facility] INVASIVE CV LAB;  Service: Cardiovascular;  Laterality: N/A;  . CARDIAC CATHETERIZATION N/A 07/31/2015   Procedure: Coronary Stent Intervention;  Surgeon: Iran Ouch, MD;  Location: [facility] INVASIVE CV LAB;  Service: Cardiovascular;  Laterality: N/A;     Current Outpatient Prescriptions  Medication Sig Dispense Refill  . acetaminophen (TYLENOL) 325 MG tablet Take 2 tablets (650 mg total) by mouth every 6 (six) hours as needed for mild pain (or Fever >/= 101).    Marland Kitchen amLODipine (NORVASC) 5 MG tablet Take 1 tablet (5 mg total) by mouth daily. 90 tablet 3  . aspirin 81 MG EC tablet Take 1 tablet (81 mg total) by mouth daily. 30 tablet 3  . atorvastatin (LIPITOR) 40 MG tablet Take 1 tablet (40 mg total) by mouth daily. 90 tablet 0  . carvedilol (COREG) 25 MG tablet Take 1 tablet (25 mg total) by mouth 2 (two) times daily. 180 tablet 0  . clopidogrel (PLAVIX) 75 MG tablet Take 1 tablet (75 mg total) by mouth daily. 90 tablet 3  . EPINEPHrine (EPIPEN 2-PAK) 0.3 mg/0.3 mL IJ SOAJ injection 0.3 mg into the upper outer thigh when necessary anaphylaxis. May dispense as generic 1 Device 2  . nitroGLYCERIN (NITROSTAT) 0.4 MG SL tablet Place 1 tablet (0.4 mg total) under the tongue every 5 (five) minutes as needed for chest pain. 10 tablet 0  . traMADol (ULTRAM)  50 MG tablet Take 1 tablet (50 mg total) by mouth every 6 (six) hours as needed. 8 tablet 0   No current facility-administered medications for this visit.     Allergies:   Eggs or egg-derived products    Social History:  The patient  reports that he has quit smoking. His smoking use included Cigarettes. He has a 39.00 pack-year smoking history. His smokeless tobacco use includes Chew. He reports that he drinks alcohol. He reports that he does not use drugs.   Family  History:  The patient's family history includes Diabetes in his mother; Hypertension in his mother.    ROS:  Please see the history of present illness.   Otherwise, review of systems are positive for none.   All other systems are reviewed and negative.    PHYSICAL EXAM: VS:  BP (!) 130/98 (BP Location: Left Arm, Patient Position: Sitting, Cuff Size: Large)   Pulse 80   Ht 6' (1.829 m)   Wt 285 lb 4 oz (129.4 kg)   BMI 38.69 kg/m  , BMI Body mass index is 38.69 kg/m. GEN: Well nourished, well developed, in no acute distress  HEENT: normal  Neck: no JVD, carotid bruits, or masses Cardiac: RRR; no murmurs, rubs, or gallops,no edema  Respiratory:  clear to auscultation bilaterally, normal work of breathing GI: soft, nontender, nondistended, + BS MS: no deformity or atrophy  Skin: warm and dry, no rash Neuro:  Strength and sensation are intact Psych: euthymic mood, full affect   EKG:  EKG is ordered today. EKG showed normal sinus rhythm with nonspecific ST and T wave changes.   Recent Labs: 02/13/2016: B Natriuretic Peptide 13.0; BUN 13; Creatinine, Ser 0.90; Hemoglobin 16.8; Platelets 257; Potassium 3.8; Sodium 136    Lipid Panel    Component Value Date/Time   CHOL 127 09/21/2015 0822   TRIG 94 09/21/2015 0822   HDL 31 (L) 09/21/2015 0822   CHOLHDL 4.1 09/21/2015 0822   CHOLHDL 5.9 07/31/2015 0054   VLDL 54 (H) 07/31/2015 0054   LDLCALC 77 09/21/2015 0822      Wt Readings from Last 3 Encounters:  01/24/17 285 lb 4 oz (129.4 kg)  05/21/16 282 lb (127.9 kg)  05/10/16 282 lb (127.9 kg)       ASSESSMENT AND PLAN:  1.  Coronary artery disease involving native coronary arteries without angina: He is overall doing very well. Continue medical therapy. It has been more than one year since his myocardial infarction and drug-eluting stent placement. Thus, I elected to discontinue clopidogrel.  2. Essential hypertension: Blood pressure is mildly elevated today. I had a  prolonged discussion with him about the importance of healthy lifestyle changes including regular exercise, healthy diet and weight loss. Check basic metabolic profile today.  3. Hyperlipidemia:  Continue treatment with atorvastatin 40 mg once daily. The 80 mg dose caused abnormal LFTs. I requested a follow-up lipid and liver profile and I might consider switching to rosuvastatin if LDL is above 70  4. Tobacco use: I again discussed with him the importance of smoking cessation. He is currently using electronic cigarettes.     Disposition:   FU with me in 6 months  Signed,  Lorine Bears, MD  01/24/2017 8:35 AM    [facility]

## 2017-01-24 NOTE — Patient Instructions (Addendum)
Medication Instructions:  Your physician has recommended you make the following change in your medication:  STOP taking plavix   Labwork: Lipid, liver and BMET today  Testing/Procedures: none  Follow-Up: Your physician wants you to follow-up in: 6 months with Dr. Kirke Corin.  You will receive a reminder letter in the mail two months in advance. If you don't receive a letter, please call our office to schedule the follow-up appointment.   Any Other Special Instructions Will Be Listed Below (If Applicable).     If you need a refill on your cardiac medications before your next appointment, please call your pharmacy.  Lipid Profile Test Why am I having this test? The lipid profile test gives results that can help predict the likelihood of developing heart disease. The test is also used to monitor treatment for high cholesterol to see if you are reaching your goals. A lipid profile measures the following:  Total cholesterol. Cholesterol is a waxy fat in your blood. If your total cholesterol is elevated, this can increase your risk of coronary heart disease.  High-density lipoprotein (HDL). This is known as the good cholesterol. Having a high level of HDL is good. Your HDL level may be low if you smoke or do not get enough exercise.  Low-density lipoprotein (LDL). This is known as the bad cholesterol and is responsible for the formation of plaque in the arteries. Having a low level of LDL is best.  Cholesterol to HDL ratio. This is calculated by dividing the total cholesterol by the HDL cholesterol. The ratio is used by health care providers for determining your risk of heart disease. A low ratio is best.  Triglycerides. These are a type of fat in the blood responsible for providing energy to your cells. Low levels are best.  What kind of sample is taken? A blood sample is required for this test. It is usually collected by inserting a needle into a vein. How do I prepare for this test? Do  not eat or drink anything after midnight on the night before the test or as directed by your health care provider. What are the reference ranges? Reference ranges are considered healthy ranges established after testing a large group of healthy people. Reference ranges may vary among different people, labs, and hospitals. It is your responsibility to obtain your test results. Ask the lab or department performing the test when and how you will get your results. Reference ranges for the lipid profile test are as follows: Total Cholesterol  Adult or elderly: less than 200 mg/dL or less than 1.61 mmol/L (SI units).  Child: 120-200 mg/dL.  Infant: 70-175 mg/dL.  Newborns: 53-135 mg/dL. HDL  Male: greater than 45 mg/dL or greater than 0.96 mmol/L (SI units).  Male: greater than 55 mg/dL or greater than 0.45 mmol/L (SI units). HDL reference values based on risk of heart disease:  For low risk of heart disease: ? Male: 60 mg/dL or 4.09 mmol/L. ? Male: 70 mg/dL or 8.11 mmol/L.  For moderate risk of heart disease: ? Male: 45 mg/dL or 9.14 mmol/L. ? Male: 55 mg/dL or 7.82 mmol/L.  For high risk of heart disease: ? Male: 25 mg/dL or 9.56 mmol/L. ? Male: 35 mg/dL or 2.13 mmol/L.  LDL  Adult: less than 130 mg/dL.  Children: less than 110 mg/dL. Cholesterol to HDL Ratio Reference values based on risk for coronary heart disease:  Risk that is one half average: ? Male: 3.4. ? Male: 3.3.  Average risk: ? Male:  5.0. ? Male: 4.4.  Risk that is two times average (moderate risk): ? Male: 10.0. ? Male: 7.0.  Risk that is three times average (high risk): ? Male: 24.0. ? Male: 11.0.  Triglycerides  Adult or elderly: ? Male: 40-160 mg/dL or 2.72-5.36 mmol/L (SI units). ? Male: 35-135 mg/dL or 6.44-0.34 mmol/L (SI units).  Children 51-81 years old: ? Male: 30-86 mg/dL. ? Male: 32-99 mg/dL.  Children 74-47 years old: ? Male: 31-108 mg/dL. ? Male: 35-114  mg/dL.  Children 104-61 years old: ? Male: 36-138 mg/dL. ? Male: 41-138 mg/dL.  Children 105-38 years old: ? Male: 40-163 mg/dL. ? Male: 40-128 mg/dL. Triglycerides should be less than 400 mg/dL even when you are not fasting. What do the results mean? Talk with your health care provider to discuss your results, treatment options, and if necessary, the need for more tests. Talk with your health care provider if you have any questions about your results. Talk with your health care provider to discuss your results, treatment options, and if necessary, the need for more tests. Talk with your health care provider if you have any questions about your results. This information is not intended to replace advice given to you by your health care provider. Make sure you discuss any questions you have with your health care provider. Document Released: 05/02/2004 Document Revised: 12/13/2015 Document Reviewed: 07/29/2013 Elsevier Interactive Patient Education  [employer].

## 2017-01-25 LAB — HEPATIC FUNCTION PANEL
ALT: 18 IU/L (ref 0–44)
AST: 20 IU/L (ref 0–40)
Albumin: 4.3 g/dL (ref 3.5–5.5)
Alkaline Phosphatase: 64 IU/L (ref 39–117)
BILIRUBIN TOTAL: 0.3 mg/dL (ref 0.0–1.2)
BILIRUBIN, DIRECT: 0.12 mg/dL (ref 0.00–0.40)
Total Protein: 6.9 g/dL (ref 6.0–8.5)

## 2017-01-25 LAB — LIPID PANEL
CHOLESTEROL TOTAL: 197 mg/dL (ref 100–199)
Chol/HDL Ratio: 5.6 ratio — ABNORMAL HIGH (ref 0.0–5.0)
HDL: 35 mg/dL — AB (ref >39)
LDL CALC: 125 mg/dL — AB (ref 0–99)
TRIGLYCERIDES: 185 mg/dL — AB (ref 0–149)
VLDL CHOLESTEROL CAL: 37 mg/dL (ref 5–40)

## 2017-01-25 LAB — BASIC METABOLIC PANEL
BUN/Creatinine Ratio: 10 (ref 9–20)
BUN: 11 mg/dL (ref 6–24)
CALCIUM: 9.1 mg/dL (ref 8.7–10.2)
CHLORIDE: 103 mmol/L (ref 96–106)
CO2: 25 mmol/L (ref 20–29)
Creatinine, Ser: 1.15 mg/dL (ref 0.76–1.27)
GFR calc Af Amer: 90 mL/min/{1.73_m2} (ref >59)
GFR calc non Af Amer: 78 mL/min/{1.73_m2} (ref >59)
Glucose: 122 mg/dL — ABNORMAL HIGH (ref 65–99)
Potassium: 4.7 mmol/L (ref 3.5–5.2)
Sodium: 139 mmol/L (ref 134–144)

## 2017-01-30 ENCOUNTER — Other Ambulatory Visit: Payer: Self-pay

## 2017-01-30 DIAGNOSIS — E7800 Pure hypercholesterolemia, unspecified: Secondary | ICD-10-CM

## 2017-01-30 MED ORDER — ROSUVASTATIN CALCIUM 40 MG PO TABS: 40.0000 mg | ORAL_TABLET | Freq: Every day | ORAL | 3 refills | Status: DC

## 2017-05-06 ENCOUNTER — Encounter: Payer: Self-pay | Admitting: *Deleted

## 2017-05-06 ENCOUNTER — Emergency Department: Payer: BC Managed Care – PPO

## 2017-05-06 ENCOUNTER — Other Ambulatory Visit: Payer: Self-pay

## 2017-05-06 ENCOUNTER — Emergency Department
Admission: EM | Admit: 2017-05-06 | Discharge: 2017-05-06 | Disposition: A | Discharge status: Home / Self Care | Payer: BC Managed Care – PPO | Attending: Student in an Organized Health Care Education/Training Program | Admitting: Student in an Organized Health Care Education/Training Program

## 2017-05-06 DIAGNOSIS — F17210 Nicotine dependence, cigarettes, uncomplicated: Secondary | ICD-10-CM | POA: Diagnosis not present

## 2017-05-06 DIAGNOSIS — I25111 Atherosclerotic heart disease of native coronary artery with angina pectoris with documented spasm: Secondary | ICD-10-CM | POA: Insufficient documentation

## 2017-05-06 DIAGNOSIS — I252 Old myocardial infarction: Secondary | ICD-10-CM | POA: Diagnosis not present

## 2017-05-06 DIAGNOSIS — Z7982 Long term (current) use of aspirin: Secondary | ICD-10-CM | POA: Diagnosis not present

## 2017-05-06 DIAGNOSIS — I10 Essential (primary) hypertension: Secondary | ICD-10-CM | POA: Diagnosis not present

## 2017-05-06 DIAGNOSIS — Z79899 Other long term (current) drug therapy: Secondary | ICD-10-CM | POA: Insufficient documentation

## 2017-05-06 DIAGNOSIS — R42 Dizziness and giddiness: Secondary | ICD-10-CM | POA: Diagnosis not present

## 2017-05-06 LAB — URINALYSIS, COMPLETE (UACMP) WITH MICROSCOPIC
Bacteria, UA: NONE SEEN
Bilirubin Urine: NEGATIVE
GLUCOSE, UA: 50 mg/dL — AB
Hgb urine dipstick: NEGATIVE
Ketones, ur: NEGATIVE mg/dL
Leukocytes, UA: NEGATIVE
Nitrite: NEGATIVE
PH: 5 (ref 5.0–8.0)
PROTEIN: NEGATIVE mg/dL
Specific Gravity, Urine: 1.015 (ref 1.005–1.030)
Squamous Epithelial / LPF: NONE SEEN

## 2017-05-06 LAB — BASIC METABOLIC PANEL
Anion gap: 9 (ref 5–15)
BUN: 9 mg/dL (ref 6–20)
CALCIUM: 9.3 mg/dL (ref 8.9–10.3)
CO2: 24 mmol/L (ref 22–32)
CREATININE: 0.88 mg/dL (ref 0.61–1.24)
Chloride: 108 mmol/L (ref 101–111)
GFR calc Af Amer: >60 mL/min (ref >60)
GFR calc non Af Amer: >60 mL/min (ref >60)
GLUCOSE: 123 mg/dL — AB (ref 65–99)
Potassium: 4.2 mmol/L (ref 3.5–5.1)
Sodium: 141 mmol/L (ref 135–145)

## 2017-05-06 LAB — CBC
HCT: 49.1 % (ref 40.0–52.0)
Hemoglobin: 16.7 g/dL (ref 13.0–18.0)
MCH: 31.6 pg (ref 26.0–34.0)
MCHC: 34.1 g/dL (ref 32.0–36.0)
MCV: 92.6 fL (ref 80.0–100.0)
PLATELETS: 272 10*3/uL (ref 150–440)
RBC: 5.3 MIL/uL (ref 4.40–5.90)
RDW: 14.1 % (ref 11.5–14.5)
WBC: 4.3 10*3/uL (ref 3.8–10.6)

## 2017-05-06 LAB — TROPONIN I
Troponin I: <0.03 ng/mL (ref <0.03)
Troponin I: <0.03 ng/mL (ref <0.03)

## 2017-05-06 MED ORDER — AMLODIPINE BESYLATE 5 MG PO TABS
5.0000 mg | ORAL_TABLET | Freq: Once | ORAL | Status: AC
  Administered 2017-05-06: 5 mg via ORAL

## 2017-05-06 MED ORDER — CARVEDILOL 25 MG PO TABS: 25.0000 mg | ORAL_TABLET | Freq: Two times a day (BID) | ORAL | Status: DC

## 2017-05-06 MED ORDER — CARVEDILOL 25 MG PO TABS
ORAL_TABLET | ORAL | Status: AC
  Administered 2017-05-06: 25 mg
  Filled 2017-05-06: qty 1

## 2017-05-06 MED ORDER — AMLODIPINE BESYLATE 5 MG PO TABS
ORAL_TABLET | ORAL | Status: AC
  Filled 2017-05-06: qty 1

## 2017-05-06 NOTE — ED Triage Notes (Signed)
PT to ED reporting HTN with dizziness that started this morning. Pt reports he has had a headache as well. No changes in vision reported. Pt reports he has been taking medication as prescribed for HTN. PT also reports increased SOB and hx of NSTEMI last year, no CP currently.

## 2017-05-06 NOTE — ED Provider Notes (Signed)
[facility] Emergency Department Provider Note    First MD Initiated Contact with Patient 05/06/17 1924     (approximate)  I have reviewed the triage vital signs and the nursing notes.   HISTORY  Chief Complaint Dizziness    HPI Jordan Maxwell is a 42 y.o. male with a history of CAD as well as hypertension hyperlipidemia presents to the ER with chief complaint of dizziness and lightheadedness that started this morning when he awoke.  States he is also had a mild posterior headache.  States his blood pressure was elevated.  Denies any history of stroke.  Has not had a headache like this before.  It was not sudden in onset.  No blurry vision.  No numbness or tingling.  States he did have brief episode of shortness of breath.  Has been compliant with his medications but has not taken his evening medications.    Past Medical History:  Diagnosis Date  . Coronary artery disease involving native coronary artery of native heart with angina pectoris with documented spasm (HCC)    a. NSTEMI 07/30/2015; b. cardiac cath: 95% stenosed pRCA s/p PCI/DES, o/w angiographically nl coronary arteries, LVSF 55-65%, no MS/AS  . H/O medication noncompliance   . Hyperlipidemia   . Hypertension   . LVH (left ventricular hypertrophy)    a. echo 07/30/2015: EF 55-65%, no RWMA, LV diastolic fxn nl, LA nl in size, PASP nl, technically difficult study 2/2 habitus  . NSTEMI (non-ST elevated myocardial infarction) (HCC) 07/30/2015  . Tobacco abuse    Family History  Problem Relation Age of Onset  . Hypertension Mother   . Diabetes Mother    Past Surgical History:  Procedure Laterality Date  . CARDIAC CATHETERIZATION N/A 07/31/2015   Procedure: Left Heart Cath and Coronary Angiography;  Surgeon: Iran OuchMuhammad A Arida, MD;  Location: [facility] INVASIVE CV LAB;  Service: Cardiovascular;  Laterality: N/A;  . CARDIAC CATHETERIZATION N/A 07/31/2015   Procedure: Coronary Stent Intervention;  Surgeon:  Iran OuchMuhammad A Arida, MD;  Location: [facility] INVASIVE CV LAB;  Service: Cardiovascular;  Laterality: N/A;   Patient Active Problem List   Diagnosis Date Noted  . Coronary artery disease involving native coronary artery of native heart with angina pectoris with documented spasm (HCC)   . Smoker   . Hypertension, accelerated 07/31/2015  . NSTEMI (non-ST elevated myocardial infarction) (HCC) 07/30/2015  . Hypertension goal BP (blood pressure) < 140/90 11/25/2014  . Hyperlipidemia LDL goal <70 11/25/2014  . Obesity, Class II, BMI 35-39.9, with comorbidity 11/25/2014  . Screening for diabetes mellitus (DM) 11/25/2014      Prior to Admission medications   Medication Sig Start Date End Date Taking? Authorizing Provider  acetaminophen (TYLENOL) 325 MG tablet Take 2 tablets (650 mg total) by mouth every 6 (six) hours as needed for mild pain (or Fever >/= 101). 08/01/15   Gouru, Deanna ArtisAruna, MD  amLODipine (NORVASC) 5 MG tablet Take 1 tablet (5 mg total) by mouth daily. 12/24/16   Iran OuchArida, Muhammad A, MD  aspirin 81 MG EC tablet Take 1 tablet (81 mg total) by mouth daily. 09/11/15   Iran OuchArida, Muhammad A, MD  carvedilol (COREG) 25 MG tablet Take 1 tablet (25 mg total) by mouth 2 (two) times daily. 01/24/17 04/24/17  Iran OuchArida, Muhammad A, MD  EPINEPHrine (EPIPEN 2-PAK) 0.3 mg/0.3 mL IJ SOAJ injection 0.3 mg into the upper outer thigh when necessary anaphylaxis. May dispense as generic 02/13/16   Governor RooksLord, Rebecca, MD  nitroGLYCERIN (NITROSTAT) 0.4 MG  SL tablet Place 1 tablet (0.4 mg total) under the tongue every 5 (five) minutes as needed for chest pain. 09/11/15   Iran Ouch, MD  rosuvastatin (CRESTOR) 40 MG tablet Take 1 tablet (40 mg total) by mouth daily. 01/30/17 04/30/17  Iran Ouch, MD  traMADol (ULTRAM) 50 MG tablet Take 1 tablet (50 mg total) by mouth every 6 (six) hours as needed. 05/21/16 05/21/17  Enid Derry, PA-C    Allergies Eggs or egg-derived products    Social History Social History   Tobacco  Use  . Smoking status: Current Every Day Smoker    Packs/day: 1.50    Years: 26.00    Pack years: 39.00    Types: Cigarettes  . Smokeless tobacco: Current User    Types: Chew  Substance Use Topics  . Alcohol use: Yes    Alcohol/week: 0.0 oz    Comment: ocassionally  . Drug use: No    Review of Systems Patient denies headaches, rhinorrhea, blurry vision, numbness, shortness of breath, chest pain, edema, cough, abdominal pain, nausea, vomiting, diarrhea, dysuria, fevers, rashes or hallucinations unless otherwise stated above in HPI. ____________________________________________   PHYSICAL EXAM:  VITAL SIGNS: Vitals:   05/06/17 1948 05/06/17 2112  BP: (!) 144/98 (!) 147/104  Pulse:  73  Resp:    Temp:    SpO2:  98%    Constitutional: Alert and oriented. Well appearing and in no acute distress. Eyes: Conjunctivae are normal.  Head: Atraumatic. Nose: No congestion/rhinnorhea. Mouth/Throat: Mucous membranes are moist.   Neck: No stridor. Painless ROM.  Cardiovascular: Normal rate, regular rhythm. Grossly normal heart sounds.  Good peripheral circulation. Respiratory: Normal respiratory effort.  No retractions. Lungs CTAB. Gastrointestinal: Soft and nontender. No distention. No abdominal bruits. No CVA tenderness. Genitourinary:  Musculoskeletal: No lower extremity tenderness nor edema.  No joint effusions. Neurologic:  CN- intact.  No facial droop, Normal FNF.  Normal heel to shin.  Sensation intact bilaterally. Normal speech and language. No gross focal neurologic deficits are appreciated. No gait instability.  Skin:  Skin is warm, dry and intact. No rash noted. Psychiatric: Mood and affect are normal. Speech and behavior are normal.  ____________________________________________   LABS (all labs ordered are listed, but only abnormal results are displayed)  Results for orders placed or performed during the hospital encounter of 05/06/17 (from the past 24 hour(s))  Basic  metabolic panel     Status: Abnormal   Collection Time: 05/06/17  4:05 PM  Result Value Ref Range   Sodium 141 135 - 145 mmol/L   Potassium 4.2 3.5 - 5.1 mmol/L   Chloride 108 101 - 111 mmol/L   CO2 24 22 - 32 mmol/L   Glucose, Bld 123 (H) 65 - 99 mg/dL   BUN 9 6 - 20 mg/dL   Creatinine, Ser 1.19 0.61 - 1.24 mg/dL   Calcium 9.3 8.9 - 14.7 mg/dL   GFR calc non Af Amer >60 >60 mL/min   GFR calc Af Amer >60 >60 mL/min   Anion gap 9 5 - 15  CBC     Status: None   Collection Time: 05/06/17  4:05 PM  Result Value Ref Range   WBC 4.3 3.8 - 10.6 K/uL   RBC 5.30 4.40 - 5.90 MIL/uL   Hemoglobin 16.7 13.0 - 18.0 g/dL   HCT 82.9 56.2 - 13.0 %   MCV 92.6 80.0 - 100.0 fL   MCH 31.6 26.0 - 34.0 pg   MCHC 34.1 32.0 -  36.0 g/dL   RDW 16.1 09.6 - 04.5 %   Platelets 272 150 - 440 K/uL  Urinalysis, Complete w Microscopic     Status: Abnormal   Collection Time: 05/06/17  4:05 PM  Result Value Ref Range   Color, Urine YELLOW (A) YELLOW   APPearance CLEAR (A) CLEAR   Specific Gravity, Urine 1.015 1.005 - 1.030   pH 5.0 5.0 - 8.0   Glucose, UA 50 (A) NEGATIVE mg/dL   Hgb urine dipstick NEGATIVE NEGATIVE   Bilirubin Urine NEGATIVE NEGATIVE   Ketones, ur NEGATIVE NEGATIVE mg/dL   Protein, ur NEGATIVE NEGATIVE mg/dL   Nitrite NEGATIVE NEGATIVE   Leukocytes, UA NEGATIVE NEGATIVE   RBC / HPF 0-5 0 - 5 RBC/hpf   WBC, UA 0-5 0 - 5 WBC/hpf   Bacteria, UA NONE SEEN NONE SEEN   Squamous Epithelial / LPF NONE SEEN NONE SEEN   Mucus PRESENT   Troponin I     Status: None   Collection Time: 05/06/17  4:05 PM  Result Value Ref Range   Troponin I <0.03 <0.03 ng/mL  Troponin I     Status: None   Collection Time: 05/06/17  8:28 PM  Result Value Ref Range   Troponin I <0.03 <0.03 ng/mL   ____________________________________________  EKG My review and personal interpretation at Time: 16:08   Indication: htn  Rate: 80  Rhythm: sinus Axis: normal Other: nonspecific concave upwards st changes most likely  BER as they are unchanged from previous EKG.  No STEMI ____________________________________________  RADIOLOGY  I personally reviewed all radiographic images ordered to evaluate for the above acute complaints and reviewed radiology reports and findings.  These findings were personally discussed with the patient.  Please see medical record for radiology report.  ____________________________________________   PROCEDURES  Procedure(s) performed:  Procedures    Critical Care performed: no ____________________________________________   INITIAL IMPRESSION / ASSESSMENT AND PLAN / ED COURSE  Pertinent labs & imaging results that were available during my care of the patient were reviewed by me and considered in my medical decision making (see chart for details).  DDX: htn, cva, dehydration, acs, vertigo, renal dysfunction  Jordan Maxwell is a 42 y.o. who presents to the ED with symptoms as described above  Patient is AFVSS in ED. Exam as above. Given current presentation have considered the above differential.  Not clinically consistent with ACS.  EKG is unchanged from previous.  Patient denies any chest pain or shortness of breath and his troponin is negative x2.  CT of the head ordered to evaluate for symptoms of lightheadedness given his multiple risk factors for CVA to evaluate for acute intracranial abnormality shows none.  His neuro exam is nonfocal.  Renal function is at baseline.  No evidence of CHF.  At this point do believe patient is stable for follow-up with PCP.  Have discussed with the patient and available family all diagnostics and treatments performed thus far and all questions were answered to the best of my ability. The patient demonstrates understanding and agreement with plan.       ____________________________________________   FINAL CLINICAL IMPRESSION(S) / ED DIAGNOSES  Final diagnoses:  Lightheadedness  Hypertension, unspecified type      NEW MEDICATIONS  STARTED DURING THIS VISIT:  Discharge Medication List as of 05/06/2017  9:07 PM       Note:  This document was prepared using Dragon voice recognition software and may include unintentional dictation errors.    Willy Eddy, MD  05/06/17 2137  

## 2017-05-06 NOTE — Discharge Instructions (Signed)
Please follow-up with PCP.  Return to the ER if symptoms worsen if you have any additional questions or concerns.  Follow-up with cardiology.

## 2017-05-06 NOTE — ED Notes (Signed)
Patient transported to X-ray 

## 2017-05-27 NOTE — Progress Notes (Signed)
Cardiology Office Note Date:  05/28/2017  Patient ID:  Jordan Maxwell, DOB 09/29/1975, MRN [id number] PCP:  Patient, No Pcp Per  Cardiologist:  Dr. Kirke Corin, MD    Chief Complaint: Needs stress test for DOT card  History of Present Illness: Jordan Maxwell is a 41 y.o. male with history of CAD with NSTEMI in 07/2015, HTN, HLD, tobacco abuse, and obesity who presents for follow up of his CAD.  Patient was admitted in 07/2015 with NSTEMI. Echo showed normal LV systolic function with an EF of 55-65%, mild LVH, normal wall motion, normal LV diastolic function, normal sized left atrium, RV systolic function was normal, PASP normal. LHC showed severe 1-vessel CAD involving the RCA. He underwent successful PCI/DES without complications. Remaining LHC details include normal left coronary tree. 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 He has previously had abnormal LFTs on high-dose Lipitor 80 mg which normalized upon decreasing the dose to 40 mg daily. He was last seen by Dr. Kirke Corin on 01/24/17 for routine follow up and was doing well at that time. He was compliant with his medications. Given it had been greater than 12 months since his PCI/DES, Plavix was discontinued and he was continued in ASA. Lipids checked at that time showed an LDL of 125. Liver function was normal. He was changed from Lipitor to Crestor.   He was seen in the ED on 05/06/17 for dizziness and lightheadedness with associated posterior headache. Patient reported his BP was elevated at home (details unknown). He reported compliance with his medications. BP in the ED showed 140s/90-100s. Troponin negative x 2. CBC unremarkable. Glucose 123. Na 141, K+ 4.2, SCr 0.88. CXR without active disease. CT head normal. EKG showed NSR with early repolarization. He was discharged to follow up with his PCP.  He comes in today doing well. No chest pain, SOB, palpitations, dizziness, presyncope, or syncope. He reports he feels like his episode of  dizziness in the ED in 04/2017 was related to he had been abstaining from meat and ate a lot of fat back at his mother's house that day leading to elevated BP. He has not had any further episodes. Tolerating all medications without issues. Has not needed any SL NTG, still has his original prescription. Requests a refill of his SL NTG just to replace his old bottle. Not checking his blood pressure at home. Smoking 1/2 pack daily, which is down from 2-3 packs daily. Has nicotine patches at home he is considering using. No concerns at this time.    Past Medical History:  Diagnosis Date  . Coronary artery disease involving native coronary artery of native heart with angina pectoris with documented spasm (HCC)    a. NSTEMI 07/30/2015; b. cardiac cath: 95% stenosed pRCA s/p PCI/DES, o/w angiographically nl coronary arteries, LVSF 55-65%, no MS/AS  . H/O medication noncompliance   . Hyperlipidemia   . Hypertension   . LVH (left ventricular hypertrophy)    a. echo 07/30/2015: EF 55-65%, no RWMA, LV diastolic fxn nl, LA nl in size, PASP nl, technically difficult study 2/2 habitus  . NSTEMI (non-ST elevated myocardial infarction) (HCC) 07/30/2015  . Tobacco abuse     Past Surgical History:  Procedure Laterality Date  . CARDIAC CATHETERIZATION N/A 07/31/2015   Procedure: Left Heart Cath and Coronary Angiography;  Surgeon: Iran Ouch, MD;  Location: [facility] INVASIVE CV LAB;  Service: Cardiovascular;  Laterality: N/A;  . CARDIAC CATHETERIZATION N/A 07/31/2015   Procedure: Coronary Stent Intervention;  Surgeon: Iran OuchMuhammad A Arida, MD;  Location: [facility] INVASIVE CV LAB;  Service: Cardiovascular;  Laterality: N/A;    Current Meds  Medication Sig  . acetaminophen (TYLENOL) 325 MG tablet Take 2 tablets (650 mg total) by mouth every 6 (six) hours as needed for mild pain (or Fever >/= 101).  Marland Kitchen. amLODipine (NORVASC) 5 MG tablet Take 1 tablet (5 mg total) by mouth daily.  Marland Kitchen. aspirin 81 MG EC tablet Take 1 tablet (81 mg  total) by mouth daily.  Marland Kitchen. EPINEPHrine (EPIPEN 2-PAK) 0.3 mg/0.3 mL IJ SOAJ injection 0.3 mg into the upper outer thigh when necessary anaphylaxis. May dispense as generic  . nitroGLYCERIN (NITROSTAT) 0.4 MG SL tablet Place 1 tablet (0.4 mg total) under the tongue every 5 (five) minutes as needed for chest pain.    Allergies:   Eggs or egg-derived products   Social History:  The patient  reports that he has been smoking cigarettes.  He has a 26.00 pack-year smoking history. His smokeless tobacco use includes chew. He reports that he drinks alcohol. He reports that he does not use drugs.   Family History:  The patient's family history includes Diabetes in his mother; Hypertension in his mother.  ROS:   Review of Systems  Constitutional: Negative for chills, diaphoresis, fever, malaise/fatigue and weight loss.  HENT: Positive for congestion.   Eyes: Negative for discharge and redness.  Respiratory: Negative for cough, hemoptysis, sputum production, shortness of breath and wheezing.   Cardiovascular: Negative for chest pain, palpitations, orthopnea, claudication, leg swelling and PND.  Gastrointestinal: Negative for abdominal pain, blood in stool, heartburn, melena, nausea and vomiting.  Genitourinary: Negative for hematuria.  Musculoskeletal: Negative for falls and myalgias.  Skin: Negative for rash.  Neurological: Negative for dizziness, tingling, tremors, sensory change, speech change, focal weakness, loss of consciousness and weakness.  Endo/Heme/Allergies: Does not bruise/bleed easily.  Psychiatric/Behavioral: Negative for substance abuse. The patient is not nervous/anxious.   All other systems reviewed and are negative.    PHYSICAL EXAM:  VS:  BP (!) 160/90 (BP Location: Left Arm, Patient Position: Sitting, Cuff Size: Normal)   Pulse 98   Ht 6' (1.829 m)   Wt 281 lb (127.5 kg)   BMI 38.11 kg/m  BMI: Body mass index is 38.11 kg/m.  Physical Exam  Constitutional: He is oriented  to person, place, and time. He appears well-developed and well-nourished.  HENT:  Head: Normocephalic and atraumatic.  Eyes: Right eye exhibits no discharge. Left eye exhibits no discharge.  Neck: Normal range of motion. No JVD present.  Cardiovascular: Normal rate, regular rhythm, S1 normal, S2 normal and normal heart sounds. Exam reveals no distant heart sounds, no friction rub, no midsystolic click and no opening snap.  No murmur heard. Pulses:      Posterior tibial pulses are 2+ on the right side, and 2+ on the left side.  Pulmonary/Chest: Effort normal and breath sounds normal. No respiratory distress. He has no decreased breath sounds. He has no wheezes. He has no rales. He exhibits no tenderness.  Abdominal: Soft. He exhibits no distension. There is no tenderness.  Musculoskeletal: He exhibits no edema.  Neurological: He is alert and oriented to person, place, and time.  Skin: Skin is warm and dry. No cyanosis. Nails show no clubbing.  Psychiatric: He has a normal mood and affect. His speech is normal and behavior is normal. Judgment and thought content normal.     EKG:  Was ordered and interpreted by me today.  Shows NSR, 98 bpm, nonspecific st/t changes  Recent Labs: 01/24/2017: ALT 18 05/06/2017: BUN 9; Creatinine, Ser 0.88; Hemoglobin 16.7; Platelets 272; Potassium 4.2; Sodium 141  01/24/2017: Chol/HDL Ratio 5.6; Cholesterol, Total 197; HDL 35; LDL Calculated 125; Triglycerides 185   CrCl cannot be calculated (Patient's most recent lab result is older than the maximum 21 days allowed.).   Wt Readings from Last 3 Encounters:  05/28/17 281 lb (127.5 kg)  05/06/17 280 lb (127 kg)  01/24/17 285 lb 4 oz (129.4 kg)     Other studies reviewed: Additional studies/records reviewed today include: summarized above  ASSESSMENT AND PLAN:  1. CAD involving the native coronary arteries without angina: No symptoms concerning for angina. He remains on monotherapy with ASA 81 mg daily with  compliance as he is greater than 12 months from prior PCI/DES. Schedule stress echo for DOT card given his prior MI with PCI. Aggressive risk factor modification with secondary prevention. If stress echo is normal, EF remains > 40%, and his blood pressure improves to < 140/90 he would be cleared for DOT card per cardiology and [facility] guidelines.   2. Dizziness: He feels like this was in the setting of poorly controlled BP while eating fat back. No further symptoms. Check echo as above. If normal, he would be cleared for DOT card from cardiology perspective.   3. HTN: Mildly elevated today. Not checking his BP at home. Reports compliance with medications. Increase amlodipine to 10 mg daily. Continue Coreg 25 mg bid. Recheck BP at time of stress echo.  4. HLD: Continue Crestor 40 mg daily Schedule fasting lipid and liver function when he comes in for his stress echo.   5. Tobacco abuse: Continued to discuss the importance of tobacco cessation. He will try nicotine patches during the day and possibly nicotine gum in the morning to help curb his AM craving.   Disposition: F/u with Dr. Kirke Corin in 6 months.   Current medicines are reviewed at length with the patient today.  The patient did not have any concerns regarding medicines.  Signed, Eula Listen, PA-C 05/28/2017 3:02 PM     [facility] [place] [address] [place], [place] [address] [phone]

## 2017-05-28 ENCOUNTER — Telehealth: Payer: Self-pay | Admitting: Cardiovascular Disease

## 2017-05-28 ENCOUNTER — Other Ambulatory Visit: Payer: Self-pay | Admitting: Physician Assistant

## 2017-05-28 ENCOUNTER — Encounter: Payer: Self-pay | Admitting: Physician Assistant

## 2017-05-28 ENCOUNTER — Ambulatory Visit (INDEPENDENT_AMBULATORY_CARE_PROVIDER_SITE_OTHER): Payer: BC Managed Care – PPO | Admitting: Physician Assistant

## 2017-05-28 ENCOUNTER — Telehealth: Payer: Self-pay | Admitting: *Deleted

## 2017-05-28 VITALS — BP 160/90 | HR 98 | Ht 72.0 in | Wt 281.0 lb

## 2017-05-28 DIAGNOSIS — I214 Non-ST elevation (NSTEMI) myocardial infarction: Secondary | ICD-10-CM

## 2017-05-28 DIAGNOSIS — I2510 Atherosclerotic heart disease of native coronary artery without angina pectoris: Secondary | ICD-10-CM

## 2017-05-28 DIAGNOSIS — E7800 Pure hypercholesterolemia, unspecified: Secondary | ICD-10-CM | POA: Diagnosis not present

## 2017-05-28 DIAGNOSIS — I10 Essential (primary) hypertension: Secondary | ICD-10-CM | POA: Diagnosis not present

## 2017-05-28 DIAGNOSIS — Z0289 Encounter for other administrative examinations: Secondary | ICD-10-CM

## 2017-05-28 DIAGNOSIS — Z955 Presence of coronary angioplasty implant and graft: Secondary | ICD-10-CM

## 2017-05-28 DIAGNOSIS — R42 Dizziness and giddiness: Secondary | ICD-10-CM | POA: Diagnosis not present

## 2017-05-28 DIAGNOSIS — Z720 Tobacco use: Secondary | ICD-10-CM

## 2017-05-28 MED ORDER — NITROGLYCERIN 0.4 MG SL SUBL: 0.4000 mg | SUBLINGUAL_TABLET | SUBLINGUAL | 3 refills | Status: DC | PRN

## 2017-05-28 MED ORDER — AMLODIPINE BESYLATE 10 MG PO TABS: 10.0000 mg | ORAL_TABLET | Freq: Every day | ORAL | 3 refills | Status: DC

## 2017-05-28 NOTE — Telephone Encounter (Signed)
Patient needs fasting lipid and liver profile. Prefers to have them in our office. Will route to Support Pool to contact patient for an appointment.

## 2017-05-28 NOTE — Telephone Encounter (Signed)
Called patient and reminded him about stress echo tomorrow at 4 pm. He verbalized understanding.

## 2017-05-28 NOTE — Telephone Encounter (Signed)
Pt scheduled for Friday 05/30/17 for labs

## 2017-05-28 NOTE — Patient Instructions (Addendum)
Medication Instructions:  Your physician has recommended you make the following change in your medication:  INCREASE amlodipine to 10mg  once daily    Labwork: Fasting lipid and liver profile the day of your stress echo. Nothing to eat or drink after midnight the evening before your labs.   Testing/Procedures: Your physician has requested that you have a stress echocardiogram. For further information please visit [email or web address] Please follow instruction sheet as given.  Do not take coreg the morning of your test. Wear comfortable walking shoes (ie., sneakers) No caffeine 24 hours before. No smoking 4 hours before.   Follow-Up: Your physician wants you to follow-up in: 6 months with Dr. Kirke Corin.  You will receive a reminder letter in the mail two months in advance. If you don't receive a letter, please call our office to schedule the follow-up appointment.   Any Other Special Instructions Will Be Listed Below (If Applicable).     If you need a refill on your cardiac medications before your next appointment, please call your pharmacy.   Exercise Stress Echocardiogram An exercise stress echocardiogram is a test that checks how well your heart is working. For this test, you will walk on a treadmill to make your heart beat faster. This test uses sound waves (ultrasound) and a computer to make pictures (images) of your heart. These pictures will be taken before you exercise and after you exercise. What happens before the procedure?  Follow instructions from your doctor about what you cannot eat or drink before the test.  Do not drink or eat anything that has caffeine in it. Stop having caffeine for 24 hours before the test.  Ask your doctor about changing or stopping your normal medicines. This is important if you take diabetes medicines or blood thinners. Ask your doctor if you should take your medicines with water before the test.  If you use an inhaler, bring it to the  test.  Do not use any products that have nicotine or tobacco in them, such as cigarettes and e-cigarettes. Stop using them for 4 hours before the test. If you need help quitting, ask your doctor.  Wear comfortable shoes and clothing. What happens during the procedure?  You will be hooked up to a TV screen. Your doctor will watch the screen to see how fast your heart beats during the test.  Before you exercise, a computer will make a picture of your heart. To do this: ? A gel will be put on your chest. ? A wand will be moved over the gel. ? Sound waves from the wand will go to the computer to make the picture.  Your will start walking on a treadmill. The treadmill will start at a slow speed. It will get faster a little bit at a time. When you walk faster, your heart will beat faster.  The treadmill will be stopped when your heart is working hard.  You will lie down right away so another picture of your heart can be taken.  The test will take 30-60 minutes. What happens after the procedure?  Your heart rate and blood pressure will be watched after the test.  If your doctor says that you can, you may: ? Eat what you usually eat. ? Do your normal activities. ? Take medicines like normal. Summary  An exercise stress echocardiogram is a test that checks how well your heart is working.  Follow instructions about what you cannot eat or drink before the test. Ask your doctor if you  should take your normal medicines before the test.  Stop having caffeine for 24 hours before the test. Do not use anything with nicotine or tobacco in it for 4 hours before the test.  A computer will take a picture of your heart before you walk on a treadmill. It will take another picture when you are done walking.  Your heart rate and blood pressure will be watched after the test. This information is not intended to replace advice given to you by your health care provider. Make sure you discuss any  questions you have with your health care provider. Document Released: 02/03/2009 Document Revised: 12/31/2015 Document Reviewed: 12/31/2015 Elsevier Interactive Patient Education  2017 [employer]

## 2017-05-29 ENCOUNTER — Other Ambulatory Visit: Payer: Self-pay | Admitting: *Deleted

## 2017-05-29 ENCOUNTER — Ambulatory Visit (INDEPENDENT_AMBULATORY_CARE_PROVIDER_SITE_OTHER): Payer: BC Managed Care – PPO

## 2017-05-29 ENCOUNTER — Other Ambulatory Visit: Payer: Self-pay | Admitting: Physician Assistant

## 2017-05-29 ENCOUNTER — Other Ambulatory Visit: Payer: Self-pay

## 2017-05-29 DIAGNOSIS — I214 Non-ST elevation (NSTEMI) myocardial infarction: Secondary | ICD-10-CM

## 2017-05-29 DIAGNOSIS — I2510 Atherosclerotic heart disease of native coronary artery without angina pectoris: Secondary | ICD-10-CM

## 2017-05-29 DIAGNOSIS — I10 Essential (primary) hypertension: Secondary | ICD-10-CM | POA: Diagnosis not present

## 2017-05-29 DIAGNOSIS — I25111 Atherosclerotic heart disease of native coronary artery with angina pectoris with documented spasm: Secondary | ICD-10-CM

## 2017-05-29 DIAGNOSIS — Z955 Presence of coronary angioplasty implant and graft: Secondary | ICD-10-CM | POA: Diagnosis not present

## 2017-05-29 DIAGNOSIS — Z0289 Encounter for other administrative examinations: Secondary | ICD-10-CM

## 2017-05-29 LAB — ECHOCARDIOGRAM LIMITED
FS: 42 % (ref 28–44)
IVS/LV PW RATIO, ED: 1
LV PW d: 15 mm — AB (ref 0.6–1.1)

## 2017-05-30 ENCOUNTER — Other Ambulatory Visit (INDEPENDENT_AMBULATORY_CARE_PROVIDER_SITE_OTHER): Payer: BC Managed Care – PPO

## 2017-05-30 DIAGNOSIS — E7800 Pure hypercholesterolemia, unspecified: Secondary | ICD-10-CM | POA: Diagnosis not present

## 2017-05-31 ENCOUNTER — Other Ambulatory Visit: Payer: BC Managed Care – PPO

## 2017-05-31 LAB — LIPID PANEL
CHOLESTEROL TOTAL: 228 mg/dL — AB (ref 100–199)
Chol/HDL Ratio: 6.5 ratio — ABNORMAL HIGH (ref 0.0–5.0)
HDL: 35 mg/dL — ABNORMAL LOW (ref >39)
LDL Calculated: 166 mg/dL — ABNORMAL HIGH (ref 0–99)
Triglycerides: 137 mg/dL (ref 0–149)
VLDL CHOLESTEROL CAL: 27 mg/dL (ref 5–40)

## 2017-06-06 LAB — EXERCISE TOLERANCE TEST
CHL CUP MPHR: 177 {beats}/min
CHL CUP RESTING HR STRESS: 102 {beats}/min
CSEPED: 7 min
CSEPEDS: 48 s
CSEPHR: 85 %
Estimated workload: 9.7 METS
Peak HR: 151 {beats}/min

## 2017-06-12 ENCOUNTER — Telehealth: Payer: Self-pay | Admitting: *Deleted

## 2017-06-12 DIAGNOSIS — I25111 Atherosclerotic heart disease of native coronary artery with angina pectoris with documented spasm: Secondary | ICD-10-CM

## 2017-06-12 DIAGNOSIS — I10 Essential (primary) hypertension: Secondary | ICD-10-CM

## 2017-06-12 DIAGNOSIS — E785 Hyperlipidemia, unspecified: Secondary | ICD-10-CM

## 2017-06-12 MED ORDER — LOSARTAN POTASSIUM 50 MG PO TABS: 50.0000 mg | ORAL_TABLET | Freq: Every day | ORAL | 3 refills | Status: DC

## 2017-06-12 MED ORDER — EZETIMIBE 10 MG PO TABS: 10.0000 mg | ORAL_TABLET | Freq: Every day | ORAL | 3 refills | Status: DC

## 2017-06-12 NOTE — Telephone Encounter (Signed)
Reviewed results and recommendations with patient. He verbalized understanding to start losartan 50 mg once daily and to have labs done in one week. Advised that I would have someone schedule and he verbalized understanding with no further questions.   Notes recorded by Iran OuchArida, Muhammad A, MD on 06/09/2017 at 4:56 PM EST Inform patient that stress test was normal. However, his BP was very high. Recommend adding Losartan 50 mg daily. Check BMP in 1 week.

## 2017-06-12 NOTE — Telephone Encounter (Signed)
Spoke with patient and reviewed results and recommendations. He verbalized understanding of medication zetia 10 mg once daily and repeat labs in 8 weeks. Advised that I would have scheduling give him a call to get this set up. He verbalized understanding of our conversation, agreement with plan, and had no further questions at this time.    Notes recorded by Sondra Bargesunn, Ryan M, PA-C on 06/02/2017 at 7:15 AM EST Cholesterol not at goal on Crestor 40 mg daily.  Please add Zetia 10 mg daily.  Recheck fasting lipid panel in 8 weeks. If LDL remains above goal at that time, [facility] [place] [facility] [place] [facility] [place] possible PCSK-9 inhibitor.

## 2017-06-16 NOTE — Telephone Encounter (Signed)
Lmov for patient to call back and schedule  °

## 2017-06-18 NOTE — Telephone Encounter (Signed)
Spoke with patient and he reports that he has been on the losartan for just 2 days. He states that when he went to pick them up he was not able to get them all due to cost but that he did request all blood pressure medications. Reviewed that he should have labs done 1 week after start of losartan. He verbalized understanding and stated that he would call back to get labs done once he has been on it for 1 week. He was appreciative for the review and had no further concerns.

## 2017-06-18 NOTE — Telephone Encounter (Signed)
Pt scheduled for labs on 06/27/17

## 2017-06-18 NOTE — Telephone Encounter (Signed)
Lmov for patient to call back and schedule  °

## 2017-06-18 NOTE — Telephone Encounter (Signed)
Pt calling stating he is not going to be able to do lab work for he hasn't started to take the new medication.   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 Not sure what medication to take or not take  Please call back

## 2017-06-27 ENCOUNTER — Other Ambulatory Visit (INDEPENDENT_AMBULATORY_CARE_PROVIDER_SITE_OTHER): Payer: BC Managed Care – PPO

## 2017-06-27 DIAGNOSIS — I25111 Atherosclerotic heart disease of native coronary artery with angina pectoris with documented spasm: Secondary | ICD-10-CM | POA: Diagnosis not present

## 2017-06-27 DIAGNOSIS — E785 Hyperlipidemia, unspecified: Secondary | ICD-10-CM

## 2017-06-28 LAB — LIPID PANEL
CHOL/HDL RATIO: 4.2 ratio (ref 0.0–5.0)
Cholesterol, Total: 150 mg/dL (ref 100–199)
HDL: 36 mg/dL — ABNORMAL LOW (ref >39)
LDL CALC: 90 mg/dL (ref 0–99)
Triglycerides: 119 mg/dL (ref 0–149)
VLDL Cholesterol Cal: 24 mg/dL (ref 5–40)

## 2017-07-07 ENCOUNTER — Telehealth: Payer: Self-pay | Admitting: Cardiovascular Disease

## 2017-07-07 DIAGNOSIS — E785 Hyperlipidemia, unspecified: Secondary | ICD-10-CM

## 2017-07-07 NOTE — Telephone Encounter (Signed)
Notes recorded by Jefferey PicaMcGhee, Heather C, RN on 07/07/2017 at 4:36 PM EDT The patient is aware of his lipid results. He is agreeable with having his fasting lipid profile repeated in about 6-8 weeks. I have advised him I will place his lab order for the hospital lab that he can go at his convenience during that 6-8 week window- that will be 4/19-5/3.  ------  Notes recorded by Jefferey PicaMcGhee, Heather C, RN on 07/02/2017 at 9:47 AM EDT I left a message for the patient to call. ------  Notes recorded by Jefferey PicaMcGhee, Heather C, RN on 07/02/2017 at 9:47 AM EDT Dunn, Raymon Muttonyan M, PA-C Jefferey PicaMcGhee, Heather C, RN    Repeat in 6-8 weeks. Thanks for catching this!   ------  Notes recorded by Jefferey PicaMcGhee, Heather C, RN on 07/01/2017 at 5:06 PM EDT Staff message sent to [place], [place]- the patient started Zetia on 06/12/17 and wasn't supposed to have his lipid panel rechecked for 8 weeks. [facility] asking if he needs to be sent directly to lipid clinic or have a repeat lipid in 6-8 weeks. Waiting on reply. ------  Notes recorded by Sondra Bargesunn, Ryan M, PA-C on 06/28/2017 at 6:57 AM EST Please call the patient. LDL improved, though still not at goal of < 70.  [place]  Continue Crestor and Zetia.

## 2017-07-07 NOTE — Telephone Encounter (Signed)
Patient returning call to heather .  Please call again .

## 2017-10-03 ENCOUNTER — Telehealth: Payer: Self-pay | Admitting: Physician Assistant

## 2017-10-03 DIAGNOSIS — I10 Essential (primary) hypertension: Secondary | ICD-10-CM

## 2017-10-03 MED ORDER — CARVEDILOL 25 MG PO TABS: 25.0000 mg | ORAL_TABLET | Freq: Two times a day (BID) | ORAL | 3 refills | Status: DC

## 2017-10-03 NOTE — Telephone Encounter (Signed)
Called in coreg refill.

## 2018-04-07 NOTE — Progress Notes (Signed)
Cardiology Office Note Date:  04/09/2018  Patient ID:  Jordan Maxwell, DOB April 09, 1976, MRN [id number] PCP:  Patient, No Pcp Per  Cardiologist:  Dr. Kirke Corin, MD    Chief Complaint: Follow up  History of Present Illness: Jordan Maxwell is a 42 y.o. male with history of CAD with NSTEMI in 07/2015, HTN, HLD, tobacco abuse, and obesity who presents for follow up of his CAD.  Patient was admitted in 07/2015 with NSTEMI. Echo showed normal LV systolic function with an EF of 55-65%, mild LVH, normal wall motion, normal LV diastolic function, normal sized left atrium, RV systolic function was normal, PASP normal. LHC showed severe 1-vessel CAD involving the RCA. He underwent successful PCI/DES without complications. Remaining LHC details include normal left coronary tree. 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 He has previously had abnormal LFTs on high-dose Lipitor 80 mg which normalized upon decreasing the dose to 40 mg daily. He was seen by Dr. Kirke Corin on 01/24/17 for routine follow up and was doing well at that time. He was compliant with his medications. Given it had been greater than 12 months since his PCI/DES, Plavix was discontinued and he was continued on ASA. Lipids checked at that time showed an LDL of 125. Liver function was normal. He was changed from Lipitor to Crestor. He was seen in the ED on 05/06/17 for dizziness and lightheadedness with associated posterior headache. Patient reported his BP was elevated at home (details unknown). He reported compliance with his medications. BP in the ED showed 140s/90-100s. Troponin negative x 2. CBC unremarkable. K+ 4.2, SCr 0.88. CXR without active disease. CT head normal. EKG showed NSR with early repolarization. He was discharged to follow up with his PCP. He was last seen in the office on 05/28/2017 for routine follow up and was doing well, without any further dizziness. He felt like his episode of dizziness in the ED in 04/2017 was related to  he had been abstaining from meat and ate a lot of fat back at his mother's house that day leading to elevated BP. He continued to smoke 1/2 pack daily, which was down from 2-3 packs daily. He underwent ETT on 05/29/2017 as part of DOT guidelines that was normal with a hypertensive response to exercise. He was started on losartan. Echo on 05/29/2017 showed an EF of 60-65%, normal wall motion, normal size left atrium, RVSF normal.   Labs: 06/2017 - LDL 90 (improved from 166) 01/2018 - LFT normal  He comes in doing well from a cardiac perspective.  He has not had any chest pain, shortness of breath, palpitations, dizziness, presyncope, or syncope.  He has been compliant with all medications outside of Crestor and Zetia.  He self discontinued these medications secondary to myalgias.  He has previously been intolerant to atorvastatin in the setting of elevated liver function testing.  He is trying to eat a heart healthy diet and has cut out most meats.  He does continue to smoke approximately 1/2 pack daily and is not interested in quitting.  He does work out at a gym several days per week without any symptoms concerning for ischemia.  No lower extremity swelling, orthopnea, PND, or early satiety.  He does not have any issues or concerns at this time.   Past Medical History:  Diagnosis Date  . Coronary artery disease involving native coronary artery of native heart with angina pectoris with documented spasm (HCC)    a. NSTEMI 07/30/2015; b. cardiac cath: 95%  stenosed pRCA s/p PCI/DES, o/w angiographically nl coronary arteries, LVSF 55-65%, no MS/AS  . H/O medication noncompliance   . Hyperlipidemia   . Hypertension   . LVH (left ventricular hypertrophy)    a. echo 07/30/2015: EF 55-65%, no RWMA, LV diastolic fxn nl, LA nl in size, PASP nl, technically difficult study 2/2 habitus  . NSTEMI (non-ST elevated myocardial infarction) (HCC) 07/30/2015  . Tobacco abuse     Past Surgical History:  Procedure Laterality  Date  . CARDIAC CATHETERIZATION N/A 07/31/2015   Procedure: Left Heart Cath and Coronary Angiography;  Surgeon: Iran OuchMuhammad A Arida, MD;  Location: [facility] INVASIVE CV LAB;  Service: Cardiovascular;  Laterality: N/A;  . CARDIAC CATHETERIZATION N/A 07/31/2015   Procedure: Coronary Stent Intervention;  Surgeon: Iran OuchMuhammad A Arida, MD;  Location: [facility] INVASIVE CV LAB;  Service: Cardiovascular;  Laterality: N/A;    Current Meds  Medication Sig  . amLODipine (NORVASC) 10 MG tablet Take 1 tablet (10 mg total) by mouth daily.  Marland Kitchen. aspirin 81 MG EC tablet Take 1 tablet (81 mg total) by mouth daily.  . carvedilol (COREG) 25 MG tablet Take 1 tablet (25 mg total) by mouth 2 (two) times daily.  Marland Kitchen. losartan (COZAAR) 50 MG tablet Take 1 tablet (50 mg total) by mouth daily.    Allergies:   Eggs or egg-derived products   Social History:  The patient  reports that he has been smoking cigarettes. He has a 26.00 pack-year smoking history. His smokeless tobacco use includes chew. He reports current alcohol use. He reports that he does not use drugs.   Family History:  The patient's family history includes Diabetes in his mother; Hypertension in his mother.  ROS:   Review of Systems  Constitutional: Negative for chills, diaphoresis, fever, malaise/fatigue and weight loss.  HENT: Negative for congestion.   Eyes: Negative for discharge and redness.  Respiratory: Negative for cough, hemoptysis, sputum production, shortness of breath and wheezing.   Cardiovascular: Negative for chest pain, palpitations, orthopnea, claudication, leg swelling and PND.  Gastrointestinal: Negative for abdominal pain, blood in stool, heartburn, melena, nausea and vomiting.  Genitourinary: Negative for hematuria.  Musculoskeletal: Negative for falls and myalgias.  Skin: Negative for rash.  Neurological: Negative for dizziness, tingling, tremors, sensory change, speech change, focal weakness, loss of consciousness and weakness.    Endo/Heme/Allergies: Does not bruise/bleed easily.  Psychiatric/Behavioral: Negative for substance abuse. The patient is not nervous/anxious.   All other systems reviewed and are negative.    PHYSICAL EXAM:  VS:  BP 138/86 (BP Location: Left Arm, Patient Position: Sitting, Cuff Size: Large)   Pulse 83   Ht 6' (1.829 m)   Wt 287 lb (130.2 kg)   BMI 38.92 kg/m  BMI: Body mass index is 38.92 kg/m.  Physical Exam  Constitutional: He is oriented to person, place, and time. He appears well-developed and well-nourished.  HENT:  Head: Normocephalic and atraumatic.  Eyes: Right eye exhibits no discharge. Left eye exhibits no discharge.  Neck: Normal range of motion. No JVD present.  Cardiovascular: Normal rate, regular rhythm, S1 normal, S2 normal and normal heart sounds. Exam reveals no distant heart sounds, no friction rub, no midsystolic click and no opening snap.  No murmur heard. Pulses:      Posterior tibial pulses are 2+ on the right side and 2+ on the left side.  Pulmonary/Chest: Effort normal and breath sounds normal. No respiratory distress. He has no decreased breath sounds. He has no wheezes. He has no  rales. He exhibits no tenderness.  Abdominal: Soft. He exhibits no distension. There is no abdominal tenderness.  Musculoskeletal:        General: No edema.  Neurological: He is alert and oriented to person, place, and time.  Skin: Skin is warm and dry. No cyanosis. Nails show no clubbing.  Psychiatric: He has a normal mood and affect. His speech is normal and behavior is normal. Judgment and thought content normal.     EKG:  Was ordered and interpreted by me today. Shows NSR, 84 BPM, normal axis, no acute ST-T changes  Recent Labs: 05/06/2017: BUN 9; Creatinine, Ser 0.88; Hemoglobin 16.7; Platelets 272; Potassium 4.2; Sodium 141  06/27/2017: Chol/HDL Ratio 4.2; Cholesterol, Total 150; HDL 36; LDL Calculated 90; Triglycerides 119   CrCl cannot be calculated (Patient's most  recent lab result is older than the maximum 21 days allowed.).   Wt Readings from Last 3 Encounters:  04/09/18 287 lb (130.2 kg)  05/28/17 281 lb (127.5 kg)  05/06/17 280 lb (127 kg)     Other studies reviewed: Additional studies/records reviewed today include: summarized above  ASSESSMENT AND PLAN:  1. CAD involving the native coronary arteries without angina: He is doing well without any symptoms concerning for angina.  He remains on monotherapy with aspirin 81 mg daily with compliance.  Continue Coreg as outlined below.  We are initiating him on a PCSK-9 inhibitor as outlined below given his statin intolerance.  Aggressive risk factor modification including smoking cessation and secondary prevention.  No plans for further ischemic evaluation at this time.  Per DOT guidelines, he will need a repeat stress test in early 2021.  2. Hyperlipidemia: Most recent LDL noted to be 90 and 06/2017.  He has previously had 2 decrease and eventually stop Lipitor secondary to elevated liver function testing.  He has been intolerant to Crestor secondary to myalgias and has self discontinued this medication along with Zetia.  We are checking a lipid panel today to update his LDL.  Given his intolerant to high intensity statin, known CAD, and LDL above goal we have agreed to start Praluent 300 mg every 4 weeks.  He prefers a once monthly dose as he does not like needles.  He indicates his mother will be able to get this injection to him.  Heart healthy diet recommended.  3. Hypertension: Blood pressure is slightly elevated today at 138/86.  Increase losartan to 100 mg daily.  He will otherwise remain on Norvasc 10 mg daily and Coreg 25 mg twice daily.  4. Tobacco abuse: Complete cessation is advised.  He is not yet ready to quit.  He will contact us when he is ready.  Disposition: F/u with Dr. Kirke Corin or an APP in 6 months.  Current medicines are reviewed at length with the patient today.  The patient did not  have any concerns regarding medicines.  Signed, Eula Listen, PA-C 04/09/2018 10:23 AM     [facility] [place] [address] [place], [place] [address] [phone]

## 2018-04-09 ENCOUNTER — Encounter: Payer: Self-pay | Admitting: Physician Assistant

## 2018-04-09 ENCOUNTER — Ambulatory Visit (INDEPENDENT_AMBULATORY_CARE_PROVIDER_SITE_OTHER): Payer: BC Managed Care – PPO | Admitting: Physician Assistant

## 2018-04-09 VITALS — BP 138/86 | HR 83 | Ht 72.0 in | Wt 287.0 lb

## 2018-04-09 DIAGNOSIS — I2510 Atherosclerotic heart disease of native coronary artery without angina pectoris: Secondary | ICD-10-CM

## 2018-04-09 DIAGNOSIS — E785 Hyperlipidemia, unspecified: Secondary | ICD-10-CM

## 2018-04-09 DIAGNOSIS — I10 Essential (primary) hypertension: Secondary | ICD-10-CM | POA: Diagnosis not present

## 2018-04-09 DIAGNOSIS — Z720 Tobacco use: Secondary | ICD-10-CM

## 2018-04-09 MED ORDER — LOSARTAN POTASSIUM 50 MG PO TABS: 100.0000 mg | ORAL_TABLET | Freq: Every day | ORAL | 3 refills | Status: DC

## 2018-04-09 MED ORDER — ALIROCUMAB 150 MG/ML ~~LOC~~ SOAJ: 300.0000 mg | SUBCUTANEOUS | 5 refills | Status: DC

## 2018-04-09 NOTE — Patient Instructions (Signed)
Medication Instructions:  1- INCREASE Losartan to 2 tablets (100 mg) once daily 2- Start Praluent injections 2 injectors (300 mg) once every 30 days.  If you need a refill on your cardiac medications before your next appointment, please call your pharmacy.   Lab work: Your physician recommends that you return for lab work  1- Today (Lipid and LFT) 2- 8 weeks, aprx 06/04/18 in [facility] medical mall [facility]  If you have labs (blood work) drawn today and your tests are completely normal, you will receive your results only by: Marland Kitchen. MyChart Message (if you have MyChart) OR . A paper copy in the mail If you have any lab test that is abnormal or we need to change your treatment, we will call you to review the results.  Testing/Procedures: None ordered   Follow-Up: At [facility], you and your health needs are our priority.  As part of our continuing mission to provide you with exceptional heart care, we have created designated Provider Care Teams.  These Care Teams include your primary Cardiologist (physician) and Advanced Practice Providers (APPs -  Physician Assistants and Nurse Practitioners) who all work together to provide you with the care you need, when you need it. You will need a follow up appointment in 6 months.  You may see Dr. Kirke CorinArida or one of the following Advanced Practice Providers on your designated Care Team:   Nicolasa Duckinghristopher Berge, NP Eula Listenyan Dunn, PA-C . Marisue IvanJacquelyn Visser, PA-C

## 2018-04-10 ENCOUNTER — Telehealth: Payer: Self-pay

## 2018-04-10 LAB — LIPID PANEL
CHOL/HDL RATIO: 6.6 ratio — AB (ref 0.0–5.0)
Cholesterol, Total: 269 mg/dL — ABNORMAL HIGH (ref 100–199)
HDL: 41 mg/dL (ref >39)
LDL Calculated: 199 mg/dL — ABNORMAL HIGH (ref 0–99)
TRIGLYCERIDES: 144 mg/dL (ref 0–149)
VLDL Cholesterol Cal: 29 mg/dL (ref 5–40)

## 2018-04-10 LAB — HEPATIC FUNCTION PANEL
ALBUMIN: 4.6 g/dL (ref 3.5–5.5)
ALK PHOS: 64 IU/L (ref 39–117)
ALT: 18 IU/L (ref 0–44)
AST: 16 IU/L (ref 0–40)
BILIRUBIN TOTAL: 0.5 mg/dL (ref 0.0–1.2)
Bilirubin, Direct: 0.14 mg/dL (ref 0.00–0.40)
TOTAL PROTEIN: 7.2 g/dL (ref 6.0–8.5)

## 2018-04-10 NOTE — Telephone Encounter (Signed)
Call to patient about lab results and provider suggestions.  Pt verbalized understanding and is agreeable to POC. He has repeat labs on/around 06/04/18.  Order confirmed in Epic.  Advised pt to call for any further questions or concerns

## 2018-04-10 NOTE — Telephone Encounter (Signed)
-----   Message from Sondra Bargesyan M Dunn, PA-C sent at 04/10/2018  6:59 AM EST ----- Liver function normal.  LDL up to 199 off of statin.  Continue with planned start of PCSK-9 inhibitor.  He will need repeat lipid and liver function in 8 weeks.

## 2018-04-13 ENCOUNTER — Telehealth: Payer: Self-pay

## 2018-04-13 NOTE — Telephone Encounter (Signed)
Ok to start Repatha 140 mg every 2 weeks. Please inform the patient we had to use a twice monthly injection due to his insurance.

## 2018-04-13 NOTE — Telephone Encounter (Signed)
Patient denied for Praluent.   Non Formulary Praluent Policy states: the preferred product for the patient's health plan is Repatha. Current plan approved criteria does not allow coverage of Praluent unless the patient has had a bad side effect with Repatha and the prescriber does not expect the same side effect to occur with Praluent.

## 2018-04-13 NOTE — Telephone Encounter (Signed)
Prior Auth started through [employer]  Nicklos Carew (Key: [id number]) Rx #: [id number] Praluent 150MG /ML auto-injectors

## 2018-04-13 NOTE — Telephone Encounter (Signed)
Message routed to provider for his knowledge.

## 2018-04-13 NOTE — Telephone Encounter (Signed)
Awaiting response for prior auth for [employer]raluent  [facility] stating [employer] company prefers Repatha.

## 2018-04-14 MED ORDER — EVOLOCUMAB 140 MG/ML ~~LOC~~ SOAJ: 140.0000 mg | SUBCUTANEOUS | 6 refills | Status: DC

## 2018-04-14 NOTE — Telephone Encounter (Signed)
I spoke with the patient. He is aware that we are needing to switch him from Praluent once monthly to Repatha twice monthly injections due to his insurance. I have advised the patient I will send in the new RX for Repatha to his pharmacy- we may still need to do a PA, but this should go through his insurance if needed.  The patient is aware to contact us if he has any questions/ concerns after receiving the RX for Repatha.

## 2018-04-14 NOTE — Addendum Note (Signed)
Addended bySherri Rad: Kenric Ginger C on: 04/14/2018 10:30 AM   Modules accepted: Orders

## 2018-04-24 ENCOUNTER — Telehealth: Payer: Self-pay

## 2018-04-24 NOTE — Telephone Encounter (Signed)
Call to patient to check status of starting Repatha. Left message.

## 2018-04-27 ENCOUNTER — Telehealth: Payer: Self-pay | Admitting: *Deleted

## 2018-04-27 NOTE — Telephone Encounter (Signed)
Pt requiring PA for Repatha 140 mg/inj. PA started through [employer].

## 2018-04-27 NOTE — Telephone Encounter (Signed)
Your information has been submitted to [employer]. To check for an updated outcome later, reopen this PA request from your dashboard. If [employer] has not responded to your request within 24 hours, contact [employer] at [phone]. If you think there may be a problem with your PA request, use our live chat feature at the bottom right.    

## 2018-04-29 NOTE — Telephone Encounter (Signed)
[facility] [employer] sent an approval letter for Repatha coverage for the following time period of 04/27/2018-04/28/2019.  Patient has been advised of approval for Repatha.

## 2018-05-18 ENCOUNTER — Telehealth: Payer: Self-pay | Admitting: Physician Assistant

## 2018-05-18 NOTE — Telephone Encounter (Signed)
I am not familiar with the DOT guidelines regarding sleep apnea and BMI.  This is outside my scope of practice.  He can always request a second opinion.

## 2018-05-18 NOTE — Telephone Encounter (Signed)
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 Please call.

## 2018-05-18 NOTE — Telephone Encounter (Signed)
I spoke with the patient. He states that he went to [facility] for his DOT physical. Per the patient, the person examining him felt that he needed a sleep study due to his BMI. 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 According to his paperwork, the wording used was "it is a medical expert panels opinion" that the sleep study be ordered.  I advised the patient I will need to review with Dr. Kirke Corin to see what his recommendations are as it is uncommon for [place] to get a request for a sleep study for DOT clearance.   I did confirm with the patient that he does not snore a great amount/ no issues with sleepiness during his waking hours.  He states he works nights, but will be awake during the day quite often with his 67 & 18 year old daughters.   The patient is aware we will be back in touch with him once reviewed by Dr. Kirke Corin.

## 2018-05-19 ENCOUNTER — Encounter: Payer: Self-pay | Admitting: *Deleted

## 2018-05-19 NOTE — Telephone Encounter (Signed)
Patient verbalized understanding of Dr Jari Sportsman recommendations. Advised him to contact PCP regarding this and he was appreciative.

## 2018-05-21 ENCOUNTER — Other Ambulatory Visit: Payer: Self-pay | Admitting: Cardiovascular Disease

## 2018-05-21 MED ORDER — AMLODIPINE BESYLATE 10 MG PO TABS: 10.0000 mg | ORAL_TABLET | Freq: Every day | ORAL | 1 refills | Status: DC

## 2018-05-21 MED ORDER — LOSARTAN POTASSIUM 50 MG PO TABS: 100.0000 mg | ORAL_TABLET | Freq: Every day | ORAL | 1 refills | Status: DC

## 2018-05-21 NOTE — Telephone Encounter (Signed)
°*  STAT* If patient is at the pharmacy, call can be transferred to refill team.   1. Which medications need to be refilled? (please list name of each medication and dose if known)  Amlodipine 10 MG 1 tablet daily Losartan 100 MG - 1 tablet daily   2. Which pharmacy/location (including street and city if local pharmacy) is medication to be sent to? [facility] on [address]  3. Do they need a 30 day or 90 day supply? 90 day

## 2018-05-21 NOTE — Telephone Encounter (Signed)
Refill sent to [facility] on [address] For amlodipine and losartan.

## 2018-09-03 ENCOUNTER — Telehealth: Payer: Self-pay

## 2018-09-03 NOTE — Telephone Encounter (Signed)
Call to patient from recall list. E visit scheduled for 5/20.  Reviewed procedure for e visit.  CONSENT FOR TELE-HEALTH VISIT - PLEASE REVIEW  I hereby voluntarily request, consent and authorize [facility] and its employed or contracted physicians, physician assistants, nurse practitioners or other licensed health care professionals (the Practitioner), to provide me with telemedicine health care services (the "Services") as deemed necessary by the treating Practitioner. I acknowledge and consent to receive the Services by the Practitioner via telemedicine. I understand that the telemedicine visit will involve communicating with the Practitioner through live audiovisual communication technology and the disclosure of certain medical information by electronic transmission. I acknowledge that I have been given the opportunity to request an in-person assessment or other available alternative prior to the telemedicine visit and am voluntarily participating in the telemedicine visit.  Pt verbally agreed.   I understand that I have the right to withhold or withdraw my consent to the use of telemedicine in the course of my care at any time, without affecting my right to future care or treatment, and that the Practitioner or I may terminate the telemedicine visit at any time. I understand that I have the right to inspect all information obtained and/or recorded in the course of the telemedicine visit and may receive copies of available information for a reasonable fee.  I understand that some of the potential risks of receiving the Services via telemedicine include:  Marland Kitchen Delay or interruption in medical evaluation due to technological equipment failure or disruption; . Information transmitted may not be sufficient (e.g. poor resolution of images) to allow for appropriate medical decision making by the Practitioner; and/or  . In rare instances, security protocols could fail, causing a breach of personal health  information.  Furthermore, I acknowledge that it is my responsibility to provide information about my medical history, conditions and care that is complete and accurate to the best of my ability. I acknowledge that Practitioner's advice, recommendations, and/or decision may be based on factors not within their control, such as incomplete or inaccurate data provided by me or distortions of diagnostic images or specimens that may result from electronic transmissions. I understand that the practice of medicine is not an exact science and that Practitioner makes no warranties or guarantees regarding treatment outcomes. I acknowledge that I will receive a copy of this consent concurrently upon execution via email to the email address I last provided but may also request a printed copy by calling the office of [facility].    I understand that my insurance will be billed for this visit.   I have read or had this consent read to me. . I understand the contents of this consent, which adequately explains the benefits and risks of the Services being provided via telemedicine.  . I have been provided ample opportunity to ask questions regarding this consent and the Services and have had my questions answered to my satisfaction. . I give my informed consent for the services to be provided through the use of telemedicine in my medical care  By participating in this telemedicine visit I agree to the above.  Pt verbally consent.

## 2018-09-09 ENCOUNTER — Telehealth (INDEPENDENT_AMBULATORY_CARE_PROVIDER_SITE_OTHER): Payer: BC Managed Care – PPO | Admitting: Nurse Practitioner

## 2018-09-09 ENCOUNTER — Other Ambulatory Visit: Payer: Self-pay

## 2018-09-09 ENCOUNTER — Encounter: Payer: Self-pay | Admitting: Nurse Practitioner

## 2018-09-09 VITALS — BP 138/88 | Ht 72.0 in | Wt 284.0 lb

## 2018-09-09 DIAGNOSIS — Z720 Tobacco use: Secondary | ICD-10-CM

## 2018-09-09 DIAGNOSIS — E785 Hyperlipidemia, unspecified: Secondary | ICD-10-CM

## 2018-09-09 DIAGNOSIS — I10 Essential (primary) hypertension: Secondary | ICD-10-CM

## 2018-09-09 DIAGNOSIS — I2510 Atherosclerotic heart disease of native coronary artery without angina pectoris: Secondary | ICD-10-CM

## 2018-09-09 MED ORDER — CARVEDILOL 25 MG PO TABS: 25.0000 mg | ORAL_TABLET | Freq: Two times a day (BID) | ORAL | 3 refills | Status: DC

## 2018-09-09 MED ORDER — AMLODIPINE BESYLATE 10 MG PO TABS: 10.0000 mg | ORAL_TABLET | Freq: Every day | ORAL | 3 refills | Status: DC

## 2018-09-09 NOTE — Patient Instructions (Signed)
It was a pleasure to speak with you on the phone today! Thank you for allowing [place] to continue taking care of your [facility] needs during this time.   Feel free to call as needed for questions and concerns related to your cardiac needs.   Medication Instructions:  Your physician recommends that you continue on your current medications as directed. Please refer to the Current Medication list given to you today.  If you need a refill on your cardiac medications before your next appointment, please call your pharmacy.   Lab work: Your physician recommends that you return for lab work in:the next 7 days at the medical mall. (BMET needed prior to filing Losartan script). No appt is needed. Hours are M-F 7AM- 6 PM.  If you have labs (blood work) drawn today and your tests are completely normal, you will receive your results only by: Marland Kitchen MyChart Message (if you have MyChart) OR . A paper copy in the mail If you have any lab test that is abnormal or we need to change your treatment, we will call you to review the results.  Testing/Procedures: None ordered   Follow-Up: At [facility], you and your health needs are our priority.  As part of our continuing mission to provide you with exceptional heart care, we have created designated Provider Care Teams.  These Care Teams include your primary Cardiologist (physician) and Advanced Practice Providers (APPs -  Physician Assistants and Nurse Practitioners) who all work together to provide you with the care you need, when you need it. You will need a follow up virtual appointment in 6 months.  Please call our office 2 months in advance to schedule this appointment.  You may see Lorine Bears, MD or Nicolasa Ducking, NP.

## 2018-09-09 NOTE — Progress Notes (Signed)
Virtual Visit via Video Note   This visit type was conducted due to national recommendations for restrictions regarding the COVID-19 Pandemic (e.g. social distancing) in an effort to limit this patient's exposure and mitigate transmission in our community.  Due to his co-morbid illnesses, this patient is at least at moderate risk for complications without adequate follow up.  This format is felt to be most appropriate for this patient at this time.  All issues noted in this document were discussed and addressed.  A limited physical exam was performed with this format.  Please refer to the patient's chart for his consent to telehealth for [facility]. Evaluation Performed:  Follow-up visit  This visit type was conducted due to national recommendations for restrictions regarding the COVID-19 Pandemic (e.g. social distancing).  This format is felt to be most appropriate for this patient at this time.  All issues noted in this document were discussed and addressed.  No physical exam was performed (except for noted visual exam findings with Video Visits).  Please refer to the patient's chart (MyChart message for video visits and phone note for telephone visits) for the patient's consent to telehealth for [facility]. _____________   Date:  09/09/2018   Patient ID:  Jordan Maxwell, DOB 03-Dec-1975, MRN [id number] Patient Location:  Home Provider location:   Office  Primary Care Provider:  Patient, No Pcp Per Primary Cardiologist:  Lorine Bears, MD  Chief Complaint    43 y/o ? with a h/o CAD s/p NSTEMI and DES  RCA in 07/2015, HTN, HL, and obesity who presents for virtual f/u related to CAD.  Past Medical History    Past Medical History:  Diagnosis Date  . CAD (coronary artery disease)    a. 07/2015 NSTEMI/Cath: 95% stenosed pRCA s/p PCI/DES, o/w angiographically nl coronary arteries, LVSF 55-65%, no MS/AS; b. 05/2017 ETT (DOT req): Ex time 7:48, no ST/T changes. HTN response.  . H/O  medication noncompliance   . Hyperlipidemia   . Hypertension   . LVH (left ventricular hypertrophy)    a. echo 07/30/2015: EF 55-65%, no RWMA, LV diastolic fxn nl, LA nl in size, PASP nl; b. 05/2017 Echo: EF 60-65%, no wrma. Nl RV fxn.  . NSTEMI (non-ST elevated myocardial infarction) (HCC) 07/30/2015  . Tobacco abuse    Past Surgical History:  Procedure Laterality Date  . CARDIAC CATHETERIZATION N/A 07/31/2015   Procedure: Left Heart Cath and Coronary Angiography;  Surgeon: Iran Ouch, MD;  Location: [facility] INVASIVE CV LAB;  Service: Cardiovascular;  Laterality: N/A;  . CARDIAC CATHETERIZATION N/A 07/31/2015   Procedure: Coronary Stent Intervention;  Surgeon: Iran Ouch, MD;  Location: [facility] INVASIVE CV LAB;  Service: Cardiovascular;  Laterality: N/A;    Allergies  Allergies  Allergen Reactions  . Eggs Or Egg-Derived Products Anaphylaxis    Incident happened as a child    History of Present Illness    Jordan Maxwell is a 43 y.o. male who presents via Web designer for a telehealth visit today.  As above, he has a history of CAD and suffered a non-STEMI in April 2017 with subsequent finding of a 95% stenosed proximal RCA and otherwise nonobstructive disease.  The RCA was successfully treated with drug-eluting stent.  He subsequently underwent exercise treadmill testing to satisfy DOT requirements in February 2019, and this was nonischemic.  He did have a hypertensive response to exercise and was placed on losartan at that time.  Other history includes hypertension, hyperlipidemia, noncompliance, LVH, and tobacco  abuse.  He was last seen in clinic in December 2019, at which time he was doing well.  Pressure was mildly elevated and his losartan was increased to 100 mg daily and he was advised to continue amlodipine and carvedilol.  In the setting of statin intolerance, he was prescribed Praluent 300 mg monthly.    Since his last visit, he has done well.  He [place] [address] [place] but returns home nightly.  He says his diet is still poor, consisting largely of what he can pick up on the road-fast food.  He has been compliant with his blood pressure medicines and did check his blood pressure within the past month and said it was 138/88.  He only ever took 1 dose of Praluent because he does not like sticking himself with needles.  He does not wish to try it again and as previously noted is intolerant to statins.  He had previously been on Zetia but discontinued on his own.  He does not routinely exercise but walks about twice a week.  He denies any chest pain or significant dyspnea on exertion, though notes he is out of shape.  He denies palpitations, PND, orthopnea, dizziness, syncope, edema, or early satiety.  The patient does not have symptoms concerning for COVID-19 infection (fever, chills, cough, or new shortness of breath).   Home Medications    Prior to Admission medications   Medication Sig Start Date End Date Taking? Authorizing Provider  acetaminophen (TYLENOL) 325 MG tablet Take 2 tablets (650 mg total) by mouth every 6 (six) hours as needed for mild pain (or Fever >/= 101). Patient not taking: Reported on 04/09/2018 08/01/15   Ramonita LabGouru, Aruna, MD  amLODipine (NORVASC) 10 MG tablet Take 1 tablet (10 mg total) by mouth daily. 05/21/18 08/19/18  Sondra Bargesunn, Ryan M, PA-C  aspirin 81 MG EC tablet Take 1 tablet (81 mg total) by mouth daily. 09/11/15   Iran OuchArida, Muhammad A, MD  carvedilol (COREG) 25 MG tablet Take 1 tablet (25 mg total) by mouth 2 (two) times daily. 10/03/17 04/09/18  [facility], Roe RutherfordAngela Nicole, PA  Evolocumab (REPATHA SURECLICK) 140 MG/ML SOAJ Inject 140 mg into the skin every 14 (fourteen) days. 04/14/18   Sondra Bargesunn, Ryan M, PA-C  losartan (COZAAR) 50 MG tablet Take 2 tablets (100 mg total) by mouth daily. 05/21/18 08/19/18  Sondra Bargesunn, Ryan M, PA-C  nitroGLYCERIN (NITROSTAT) 0.4 MG SL tablet Place 1 tablet (0.4 mg total) under the tongue every  5 (five) minutes as needed for chest pain. Patient not taking: Reported on 04/09/2018 05/28/17   Sondra Bargesunn, Ryan M, PA-C    Review of Systems    He denies chest pain, palpitations, dyspnea, pnd, orthopnea, n, v, dizziness, syncope, edema, weight gain, or early satiety.  All other systems reviewed and are otherwise negative except as noted above.  Physical Exam    Vital Signs:  BP 138/88 Comment: Unsble to take BP at home  Ht 6' (1.829 m)   Wt 284 lb (128.8 kg)   BMI 38.52 kg/m    Obese male in no acute distress.  Awake alert and oriented x3.  Respirations regular and unlabored.  Accessory Clinical Findings     Lab Results  Component Value Date   CHOL 269 (H) 04/09/2018   HDL 41 04/09/2018   LDLCALC 199 (H) 04/09/2018   TRIG 144 04/09/2018   CHOLHDL 6.6 (H) 04/09/2018    Lab Results  Component Value Date  ALT 18 04/09/2018   AST 16 04/09/2018   ALKPHOS 64 04/09/2018   BILITOT 0.5 04/09/2018     Assessment & Plan    1.  Coronary artery disease: Status post drug-eluting stent placement to the RCA in 2017 in the setting of a non-STEMI.  He has done well since then from a cardiovascular standpoint without any chest pain or significant activity limiting dyspnea.  He remembered the latter, ARB therapy.  He has nitroglycerin at home but has not had to use it.  He is previously intolerant to statins and self discontinued Zetia.  He was prescribed Praluent at his last visit but stopped it because he does not like to inject himself with needles.  2.  Essential hypertension: He does not check his blood pressure regularly but says that when he did check it about a month ago or so, he thinks he was 138/88.  He has been compliant with his antihypertensives but not so much so with his diet, frequently eating fast foods.  Symptoms are made to take.  Refilling beta-blocker and amlodipine today with plan to follow-up basic metabolic panel prior to refilling losartan.  3.  Hyperlipidemia: LDL of 199  in December 2019.  Intolerant to Crestor and he also thinks he is intolerant to Zetia.  He tried Praluent but does not like to inject himself.  Difficult situation..  Lipid clinic referral in the future.  4.  Tobacco abuse: Smoking 1 pack a day.  Complete cessation advised.  He is not currently considering quitting.  5.  Morbid obesity: Encouraged him to limit his calories and increase activity.  He says he walks about twice a week.  6.  Disposition: Follow-up basic metabolic panel prior to refill of losartan.  Follow-up in clinic or via virtual visit in 6 months.  COVID-19 Education: The signs and symptoms of COVID-19 were discussed with the patient and how to seek care for testing (follow up with PCP or arrange E-visit).  The importance of social distancing was discussed today.  Patient Risk:   After full review of this patient's history and clinical status, I feel that he is at least moderate risk for cardiac complications at this time, thus necessitating a telehealth visit sooner than our first available in office visit.  Time:   Today, I have spent 15 minutes with the patient with telehealth technology discussing history, symptoms, and mgmt plan.     Nicolasa Ducking, NP 09/09/2018, 10:51 AM

## 2018-11-19 ENCOUNTER — Other Ambulatory Visit: Payer: Self-pay | Admitting: Physician Assistant

## 2019-02-19 ENCOUNTER — Other Ambulatory Visit: Payer: Self-pay | Admitting: Physician Assistant

## 2019-03-29 IMAGING — CT CT HEAD W/O CM
3 series · 15 of 47 positions shown, 18 images · non-contrast
Comparison: 11/04/2013

CLINICAL DATA: PT to ED reporting HTN with dizziness that started
this morning. Pt reports he has had a headache as well. No changes
in vision reported. Pt reports he has been taking medication as
prescribed for HTN.

EXAM:
CT HEAD WITHOUT CONTRAST
TECHNIQUE: Contiguous axial images were obtained from the base of the skull
through the vertex without intravenous contrast.

[Series 2: head wo · axial · 0.45mm/px · z∈[-26,+99]mm · 9 of 31 slices shown, 12 images]
[im 3/31  brain]
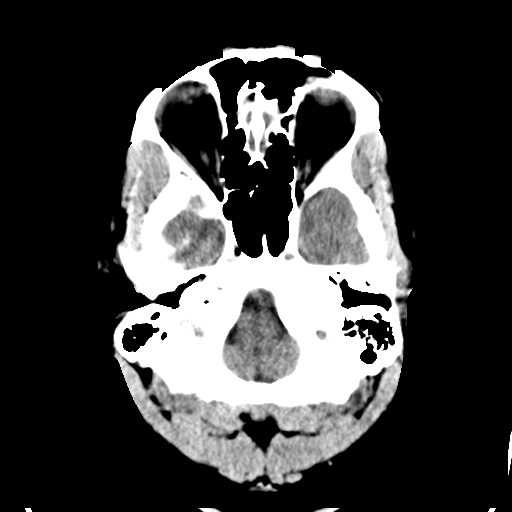
[im 3/31  bone]
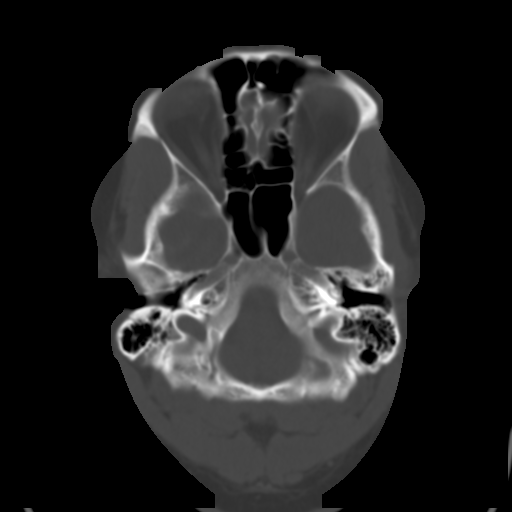
[im 6/31  brain]
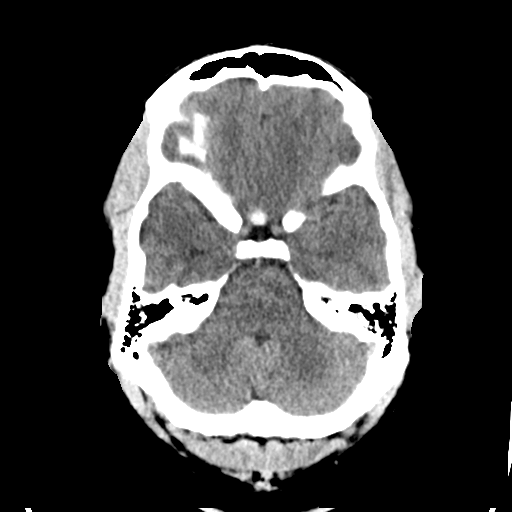
[im 9/31  brain]
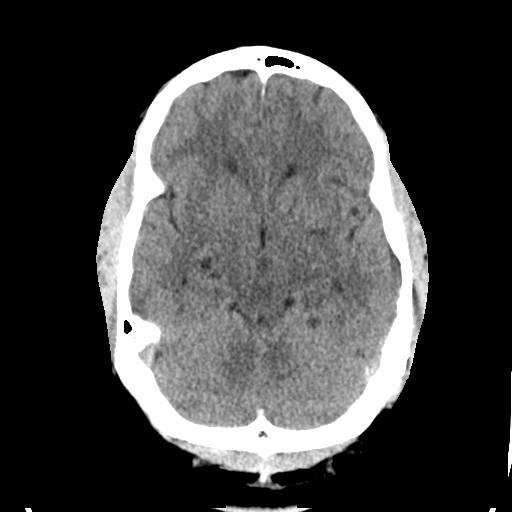
[im 12/31  brain]
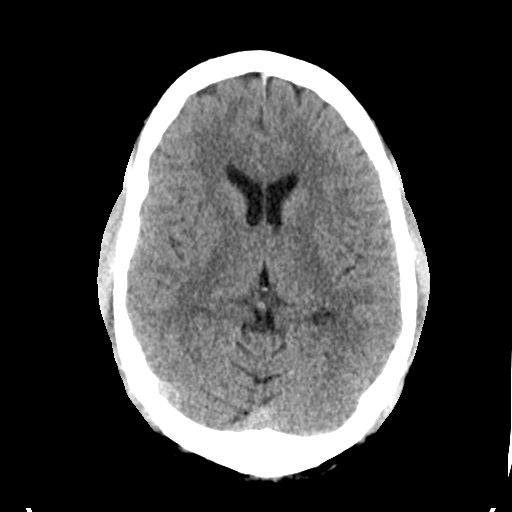
[im 16/31  brain]
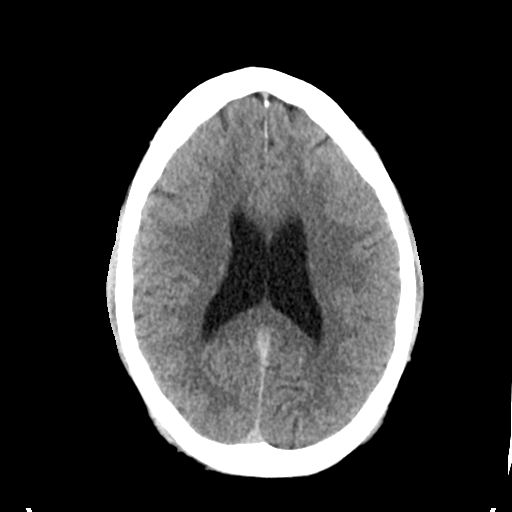
[im 16/31  bone]
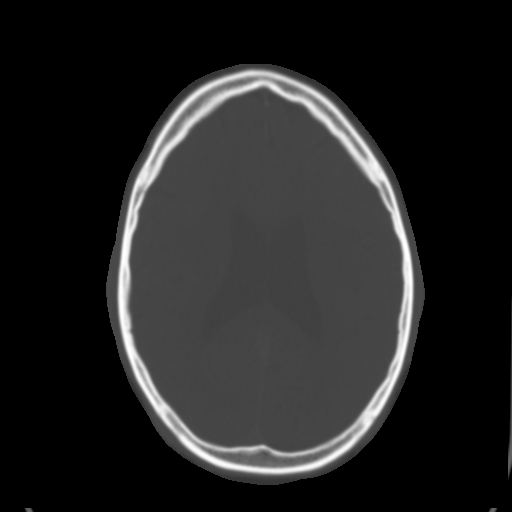
[im 19/31  brain]
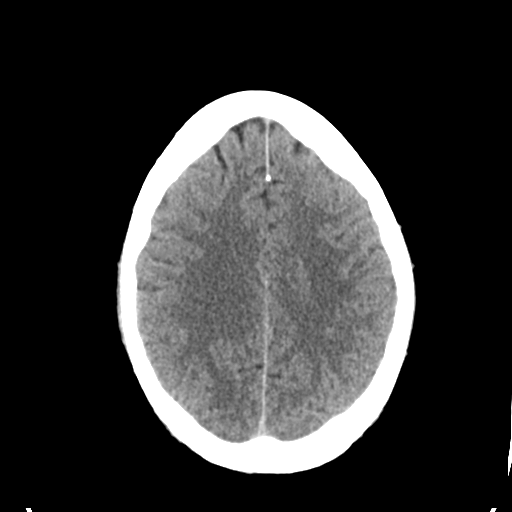
[im 22/31  brain]
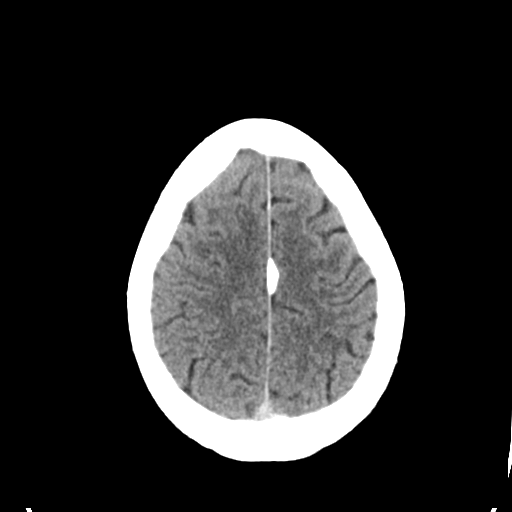
[im 25/31  brain]
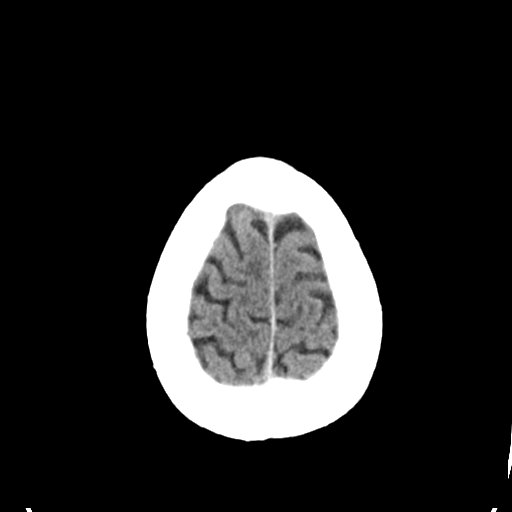
[im 28/31  brain]
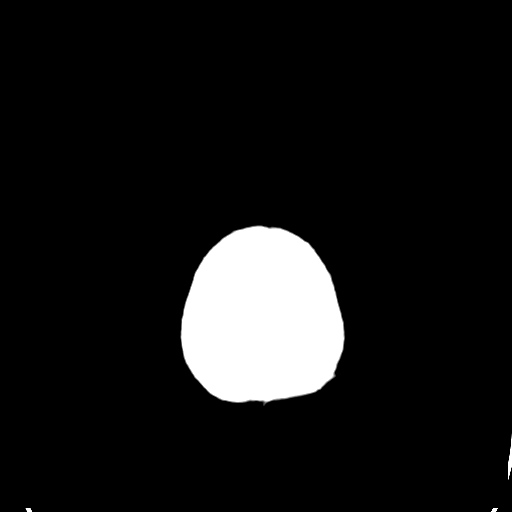
[im 28/31  bone]
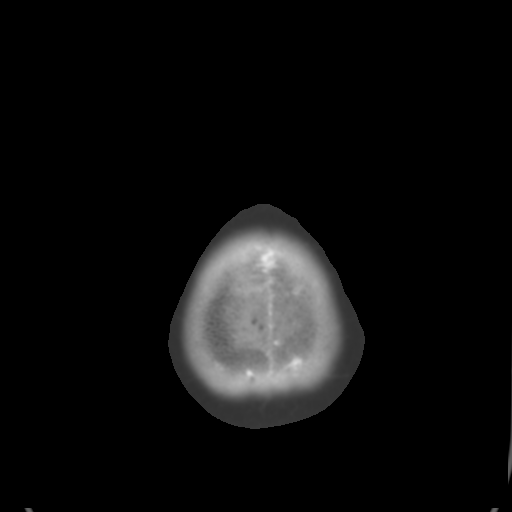

[Series 4: coronal soft tissue · coronal · 0.32mm/px · 3 of 69 slices shown]
[im 23/69  brain]
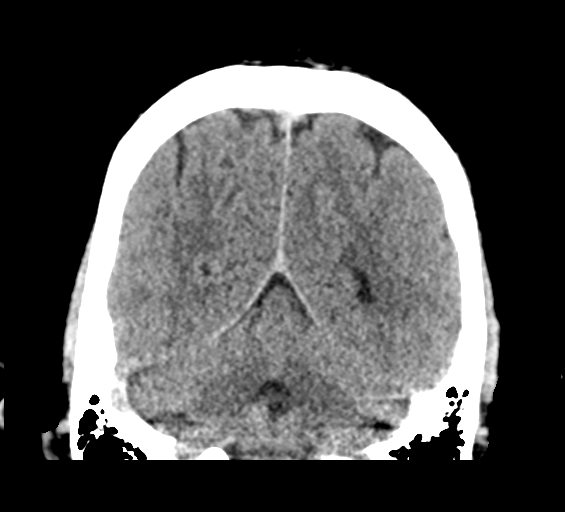
[im 31/69  brain]
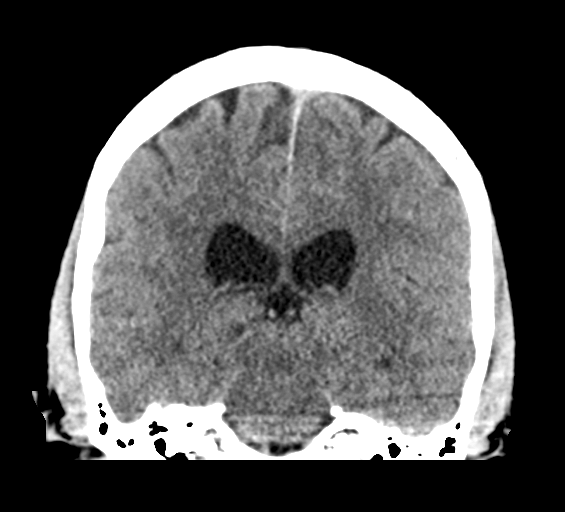
[im 38/69  brain]
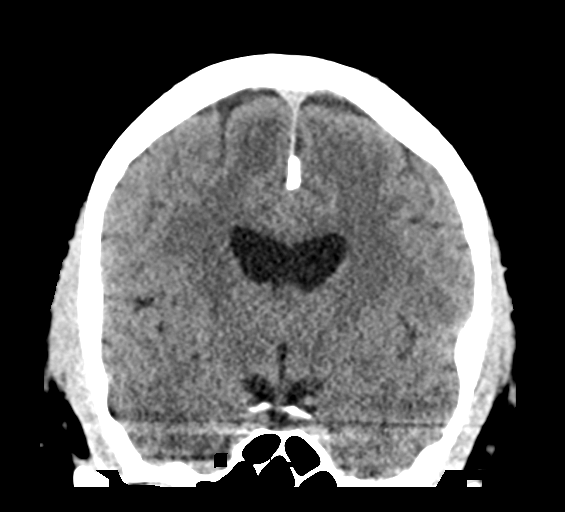

[Series 5: sagittal soft tissue · sagittal · 0.32mm/px · 3 of 54 slices shown]
[im 18/54  brain]
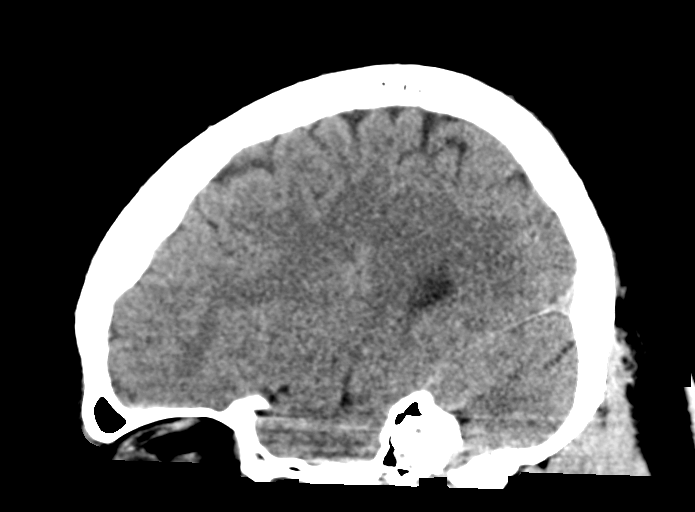
[im 27/54  brain]
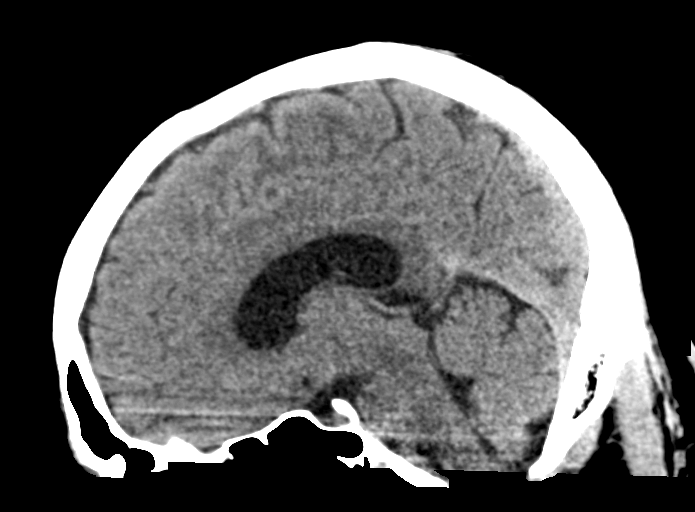
[im 36/54  brain]
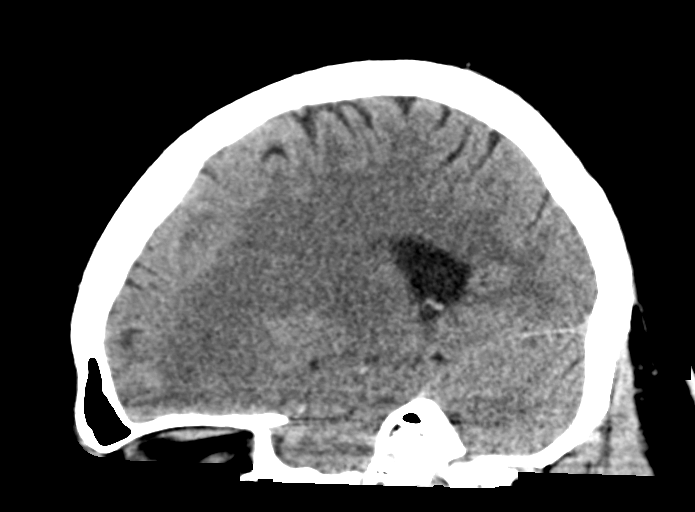

[15 of 47 positions shown; findings below may reference images not displayed]

FINDINGS: Brain: No evidence of acute infarction, hemorrhage, hydrocephalus,
extra-axial collection or mass lesion/mass effect.

Vascular: No hyperdense vessel or unexpected calcification.

Skull: Normal. Negative for fracture or focal lesion.

Sinuses/Orbits: Visualize globes and orbits are unremarkable. The
visualized sinuses and mastoid air cells are clear.

Other: None.
IMPRESSION: Normal unenhanced CT scan of the brain.

## 2019-03-29 IMAGING — CR DG CHEST 2V
2 series · 2 of 2 positions shown · non-contrast
Comparison: 05/21/2016 and 07/30/2015

CLINICAL DATA: Dizziness beginning this morning with headache and
visual changes. Shortness-of-breath.

EXAM:
CHEST  2 VIEW

[chest pa]
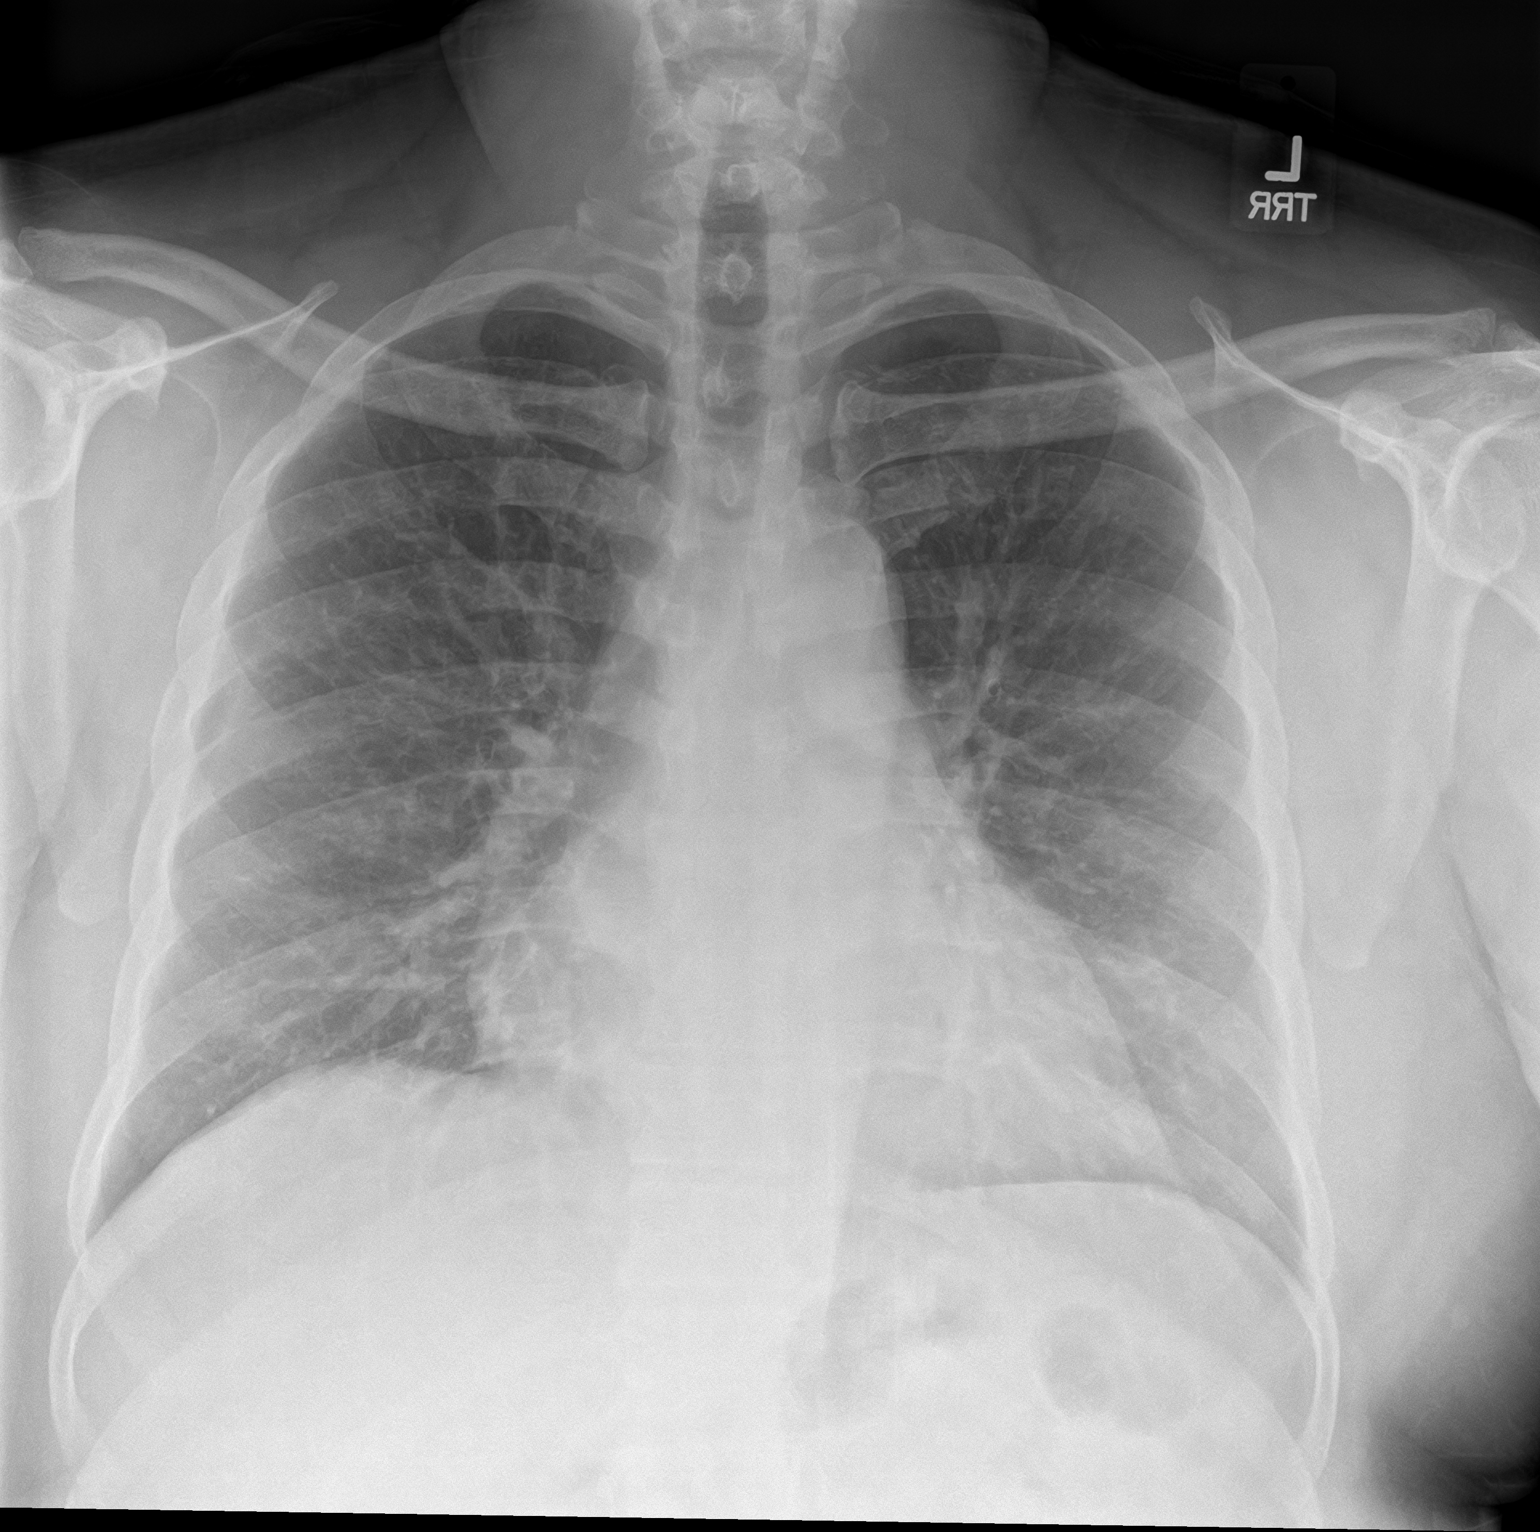

[chest lat]
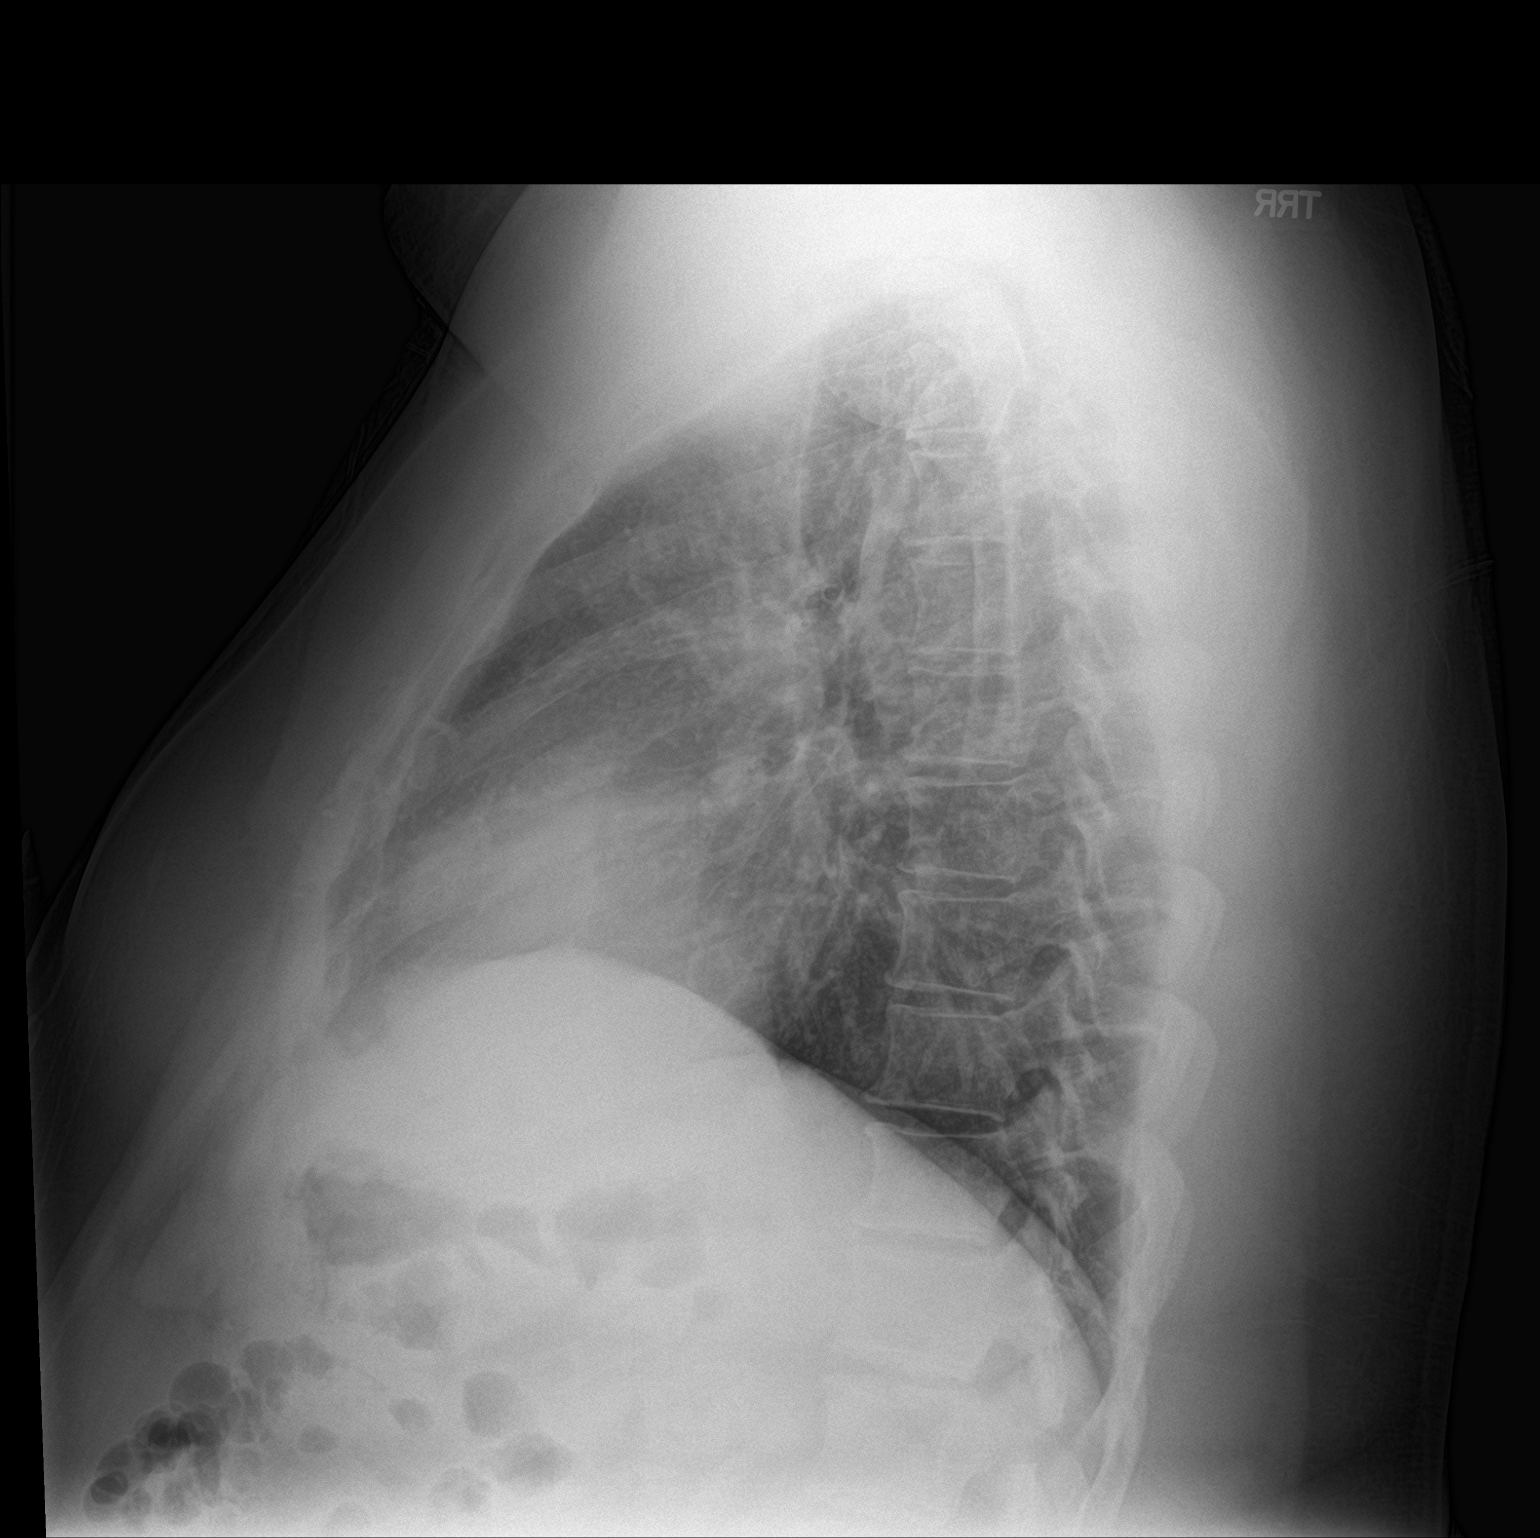

[2 of 2 positions shown; findings below may reference images not displayed]

FINDINGS: Lungs are adequately inflated with no focal consolidation or
effusion. Mild increased density over the anterior lung base on the
lateral film unchanged likely related to pericardiac fat. Mild
stable cardiomegaly. Remainder of the exam is unchanged.
IMPRESSION: No active cardiopulmonary disease.

## 2019-05-14 NOTE — Progress Notes (Signed)
Cardiology Office Note    Date:  05/20/2019   ID:  Jordan Maxwell, DOB 1975/10/15, MRN [id number]  PCP:  Patient, No Pcp Per  Cardiologist:  Lorine Bears, MD  Electrophysiologist:  None   Chief Complaint: Follow-up  History of Present Illness:   Jordan Maxwell is a 44 y.o. male with history of CAD with NSTEMI and 07/2015 status post PCI/DES to the RCA, HTN, HLD with statin intolerance previously on Praluent, LVH, tobacco use, and obesity who presents for follow-up of his CAD.  He was admitted to the hospital in 07/2015 with a non-STEMI.  LHC showed a 95% proximal RCA stenosis with otherwise nonobstructive disease as detailed below.  He underwent successful PCI/DES.  He has subsequently undergone ETT in 05/2017 to satisfy DOT requirements which was nonischemic.  He did have a hypertensive response to exercise and was placed on losartan at that time.  He was last seen virtually in 08/2018 and was doing well from a cardiac perspective.  He indicated his diet was still consisting largely of fast food.  He was compliant with medications and reported a home BP of 138/88.  He indicated he had only taken 1 dose of Praluent because he did not like sticking himself with needles.  He did not wish to try again.  He comes in doing well today.  He denies any chest pain, palpitations, shortness of breath, dizziness, presyncope, syncope.  No lower extremity swelling, abdominal surgeon, orthopnea, PND, early satiety.  Not checking blood pressure at home.  Compliant with all medications.  Trying to work on with a healthier diet.  Continues to drink 1 bottle of vodka on the weekends.  Would like to lose weight.  Otherwise, does not have any issues or concerns at this time.  He is currently fasting.   Labs independently reviewed: 03/2018 - TC 269, TG 144, HDL 41, LDL 199, AST/ALT normal, albumin 4.6 04/2017 - Hgb 16.7, PLT 272, potassium 4.2, BUN 9, serum creatinine 0.88  Past Medical History:  Diagnosis Date  .  CAD (coronary artery disease)    a. 07/2015 NSTEMI/Cath: 95% stenosed pRCA s/p PCI/DES, o/w angiographically nl coronary arteries, LVSF 55-65%, no MS/AS; b. 05/2017 ETT (DOT req): Ex time 7:48, no ST/T changes. HTN response.  . H/O medication noncompliance   . Hyperlipidemia   . Hypertension   . LVH (left ventricular hypertrophy)    a. echo 07/30/2015: EF 55-65%, no RWMA, LV diastolic fxn nl, LA nl in size, PASP nl; b. 05/2017 Echo: EF 60-65%, no wrma. Nl RV fxn.  . NSTEMI (non-ST elevated myocardial infarction) (HCC) 07/30/2015  . Tobacco abuse     Past Surgical History:  Procedure Laterality Date  . CARDIAC CATHETERIZATION N/A 07/31/2015   Procedure: Left Heart Cath and Coronary Angiography;  Surgeon: Iran Ouch, MD;  Location: [facility] INVASIVE CV LAB;  Service: Cardiovascular;  Laterality: N/A;  . CARDIAC CATHETERIZATION N/A 07/31/2015   Procedure: Coronary Stent Intervention;  Surgeon: Iran Ouch, MD;  Location: [facility] INVASIVE CV LAB;  Service: Cardiovascular;  Laterality: N/A;    Current Medications: Current Meds  Medication Sig  . aspirin 81 MG EC tablet Take 1 tablet (81 mg total) by mouth daily.  Marland Kitchen losartan (COZAAR) 100 MG tablet Take 1 tablet by mouth once daily  . nitroGLYCERIN (NITROSTAT) 0.4 MG SL tablet Place 1 tablet (0.4 mg total) under the tongue every 5 (five) minutes as needed for chest pain.    Allergies:   Eggs or egg-derived  products and Statins   Social History   Socioeconomic History  . Marital status: Single    Spouse name: Not on file  . Number of children: 4  . Years of education: Not on file  . Highest education level: Not on file  Occupational History  . Occupation: Truck [employer] doctor    Comment: [employer]  Tobacco Use  . Smoking status: Current Every Day Smoker    Packs/day: 0.50    Years: 26.00    Pack years: 13.00    Types: Cigarettes  . Smokeless tobacco: Current User    Types: Chew  . Tobacco comment: Pack every two days  Substance and  Sexual Activity  . Alcohol use: Yes    Alcohol/week: 0.0 standard drinks    Comment: ocassionally  . Drug use: No  . Sexual activity: Yes    Partners: Female    Birth control/protection: None  Other Topics Concern  . Not on file  Social History Narrative  . Not on file   Social Determinants of Health   Financial Resource Strain:   . Difficulty of Paying Living Expenses: Not on file  Food Insecurity:   . Worried About Programme researcher, broadcasting/film/video in the Last Year: Not on file  . Ran Out of Food in the Last Year: Not on file  Transportation Needs:   . Lack of Transportation (Medical): Not on file  . Lack of Transportation (Non-Medical): Not on file  Physical Activity:   . Days of Exercise per Week: Not on file  . Minutes of Exercise per Session: Not on file  Stress:   . Feeling of Stress : Not on file  Social Connections:   . Frequency of Communication with Friends and Family: Not on file  . Frequency of Social Gatherings with Friends and Family: Not on file  . Attends Religious Services: Not on file  . Active Member of Clubs or Organizations: Not on file  . Attends Banker Meetings: Not on file  . Marital Status: Not on file     Family History:  The patient's family history includes Diabetes in his mother; Hypertension in his mother.  ROS:   Review of Systems  Constitutional: Negative for chills, diaphoresis, fever, malaise/fatigue and weight loss.  HENT: Negative for congestion.   Eyes: Negative for discharge and redness.  Respiratory: Negative for cough, sputum production, shortness of breath and wheezing.   Cardiovascular: Negative for chest pain, palpitations, orthopnea, claudication, leg swelling and PND.  Gastrointestinal: Negative for abdominal pain, blood in stool, heartburn, melena, nausea and vomiting.  Musculoskeletal: Negative for falls and myalgias.  Skin: Negative for rash.  Neurological: Negative for dizziness, tingling, tremors, sensory change,  speech change, focal weakness, loss of consciousness and weakness.  Endo/Heme/Allergies: Does not bruise/bleed easily.  Psychiatric/Behavioral: Negative for substance abuse. The patient is not nervous/anxious.   All other systems reviewed and are negative.    EKGs/Labs/Other Studies Reviewed:    Studies reviewed were summarized above. The additional studies were reviewed today:  LHC 07/2015:  The left ventricular systolic function is normal.  Prox RCA lesion, 95% stenosed. Post intervention, there is a 0% residual stenosis.   1. Severe one-vessel coronary artery disease with 95% hazy proximal RCA stenosis. This is likely the culprit for non-ST elevation myocardial infarction. 2. Normal LV systolic function and mildly elevated left ventricular end-diastolic pressure. 3. Successful angioplasty and drug-eluting stent placement to the proximal right coronary artery.  Recommendations: Dual antiplatelet therapy for at least  6 months and ideally for 12 months. The patient will need assistance with his medications. Brilinta can be switched to Plavix after one month. I switched amlodipine to lisinopril for cost reasons and added carvedilol. Smoking cessation is strongly advised. Discharge home tomorrow if stable. __________  2D echo 07/2015: - Procedure narrative: Transthoracic echocardiography. Image   quality was poor. The study was technically difficult, as a   result of poor sound wave transmission and body habitus. - Left ventricle: The cavity size was normal. Wall thickness was   increased in a pattern of mild LVH. Systolic function was normal.   The estimated ejection fraction was in the range of 55% to 65%.   Wall motion was normal; there were no regional wall motion   abnormalities. Left ventricular diastolic function parameters   were normal. - Aortic valve: Valve area (Vmax): 1.85 cm^2. - Left atrium: The atrium was normal in size. - Right ventricle: Systolic function was  normal. - Pulmonary arteries: Systolic pressure was within the normal   range. __________  ETT 08/2015:  There was no ST segment deviation noted during stress.  No T wave inversion was noted during stress.   Normal treadmill stress test. Good exercise capacity. He exercised for 8 minutes. Mildly elevated blood pressure at baseline with mild hypertensive response to exercise. Low risk [facility] treadmill score. __________  ETT 05/2017:  Blood pressure demonstrated a hypertensive response to exercise.  There was no ST segment deviation noted during stress.  No T wave inversion was noted during stress.   Normal treadmill stress test with no evidence of ischemia. Average exercise capacity with exercise duration of 7 minutes and 48 seconds. Elevated blood pressure at baseline at 165/101 with hypertensive response to exercise at 218/95 __________  Limited echo 2018: - Left ventricle: The cavity size was normal. There was mild   concentric hypertrophy. Systolic function was normal. The   estimated ejection fraction was in the range of 60% to 65%. Wall   motion was normal; there were no regional wall motion   abnormalities. The study is not technically sufficient to allow   evaluation of LV diastolic function. - Left atrium: The atrium was normal in size. - Right ventricle: Systolic function was normal. - Pulmonary arteries: Systolic pressure could not be accurately   estimated.   EKG:  EKG is ordered today.  The EKG ordered today demonstrates NSR, 82 bpm, normal axis, no acute ST-T changes  Recent Labs: No results found for requested labs within last 8760 hours.  Recent Lipid Panel    Component Value Date/Time   CHOL 269 (H) 04/09/2018 1050   TRIG 144 04/09/2018 1050   HDL 41 04/09/2018 1050   CHOLHDL 6.6 (H) 04/09/2018 1050   CHOLHDL 5.9 07/31/2015 0054   VLDL 54 (H) 07/31/2015 0054   LDLCALC 199 (H) 04/09/2018 1050    PHYSICAL EXAM:    VS:  BP (!) 138/102 (BP  Location: Right Leg, Patient Position: Sitting, Cuff Size: Normal)   Pulse 82   Resp 18   Ht 6' (1.829 m)   Wt 292 lb 6 oz (132.6 kg)   BMI 39.65 kg/m   BMI: Body mass index is 39.65 kg/m.  Physical Exam  Constitutional: He is oriented to person, place, and time. He appears well-developed and well-nourished.  HENT:  Head: Normocephalic and atraumatic.  Eyes: Right eye exhibits no discharge. Left eye exhibits no discharge.  Neck: No JVD present.  Cardiovascular: Normal rate, regular rhythm, S1 normal,  S2 normal and normal heart sounds. Exam reveals no distant heart sounds, no friction rub, no midsystolic click and no opening snap.  No murmur heard. Pulses:      Posterior tibial pulses are 2+ on the right side and 2+ on the left side.  Pulmonary/Chest: Effort normal and breath sounds normal. No respiratory distress. He has no decreased breath sounds. He has no wheezes. He has no rales. He exhibits no tenderness.  Abdominal: Soft. He exhibits no distension. There is no abdominal tenderness.  Musculoskeletal:        General: No edema.     Cervical back: Normal range of motion.  Neurological: He is alert and oriented to person, place, and time.  Skin: Skin is warm and dry. No cyanosis. Nails show no clubbing.  Psychiatric: He has a normal mood and affect. His speech is normal and behavior is normal. Judgment and thought content normal.    Wt Readings from Last 3 Encounters:  05/20/19 292 lb 6 oz (132.6 kg)  09/09/18 284 lb (128.8 kg)  04/09/18 287 lb (130.2 kg)     ASSESSMENT & PLAN:   1. CAD involving native coronary arteries without angina: He is doing well without any symptoms concerning for angina.  Continue secondary prevention with aspirin, amlodipine, carvedilol, and losartan.  Intolerant to statins/Zetia and PCSK9 inhibitor as outlined below.  Plan for GXT secondary to DOT guidelines as he is approximately 2 years out from his last ETT.  2. HTN: Blood pressure suboptimally  controlled today.  He is compliant with all medications.  Titrate carvedilol to 37.5 mg twice daily, otherwise he will continue current doses of amlodipine and losartan.  Low-sodium diet recommended.  Weight loss advised as outlined below.  3. HLD: Intolerant to statins/Zetia.  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  Will refer to lipid clinic for consideration of bempedoic acid.  Check CMP and FLP.  Goal LDL less than 70.  4. Tobacco use: Complete cessation is recommended.  5. Alcohol use: Recommend patient begin tapering alcohol use followed by complete cessation.  6. Obesity: Weight loss is advised.  Healthy diet discussed with patient and wife.  Disposition: F/u with Dr. Fletcher Anon or an APP in 6 to 12 months.   Medication Adjustments/Labs and Tests Ordered: Current medicines are reviewed at length with the patient today.  Concerns regarding medicines are outlined above. Medication changes, Labs and Tests ordered today are summarized above and listed in the Patient Instructions accessible in Encounters.   Signed, Christell Faith, PA-C 05/20/2019 8:46 AM     [address] [place], [place] [address] [phone]

## 2019-05-20 ENCOUNTER — Ambulatory Visit (INDEPENDENT_AMBULATORY_CARE_PROVIDER_SITE_OTHER): Payer: BC Managed Care – PPO | Admitting: Physician Assistant

## 2019-05-20 ENCOUNTER — Encounter: Payer: Self-pay | Admitting: Physician Assistant

## 2019-05-20 ENCOUNTER — Other Ambulatory Visit
Admission: RE | Admit: 2019-05-20 | Discharge: 2019-05-20 | Disposition: A | Discharge status: Home / Self Care | Payer: BC Managed Care – PPO | Source: Ambulatory Visit | Attending: Physician Assistant | Admitting: Physician Assistant

## 2019-05-20 ENCOUNTER — Other Ambulatory Visit: Payer: Self-pay

## 2019-05-20 VITALS — BP 138/102 | HR 82 | Resp 18 | Ht 72.0 in | Wt 292.4 lb

## 2019-05-20 DIAGNOSIS — Z789 Other specified health status: Secondary | ICD-10-CM

## 2019-05-20 DIAGNOSIS — I2510 Atherosclerotic heart disease of native coronary artery without angina pectoris: Secondary | ICD-10-CM | POA: Diagnosis not present

## 2019-05-20 DIAGNOSIS — Z7289 Other problems related to lifestyle: Secondary | ICD-10-CM

## 2019-05-20 DIAGNOSIS — I10 Essential (primary) hypertension: Secondary | ICD-10-CM | POA: Diagnosis not present

## 2019-05-20 DIAGNOSIS — E785 Hyperlipidemia, unspecified: Secondary | ICD-10-CM | POA: Diagnosis present

## 2019-05-20 DIAGNOSIS — Z720 Tobacco use: Secondary | ICD-10-CM

## 2019-05-20 DIAGNOSIS — F1090 Alcohol use, unspecified, uncomplicated: Secondary | ICD-10-CM

## 2019-05-20 LAB — COMPREHENSIVE METABOLIC PANEL
ALT: 26 U/L (ref 0–44)
AST: 28 U/L (ref 15–41)
Albumin: 4.1 g/dL (ref 3.5–5.0)
Alkaline Phosphatase: 52 U/L (ref 38–126)
Anion gap: 9 (ref 5–15)
BUN: 12 mg/dL (ref 6–20)
CO2: 26 mmol/L (ref 22–32)
Calcium: 9.6 mg/dL (ref 8.9–10.3)
Chloride: 103 mmol/L (ref 98–111)
Creatinine, Ser: 0.94 mg/dL (ref 0.61–1.24)
GFR calc Af Amer: >60 mL/min (ref >60)
GFR calc non Af Amer: >60 mL/min (ref >60)
Glucose, Bld: 140 mg/dL — ABNORMAL HIGH (ref 70–99)
Potassium: 4.5 mmol/L (ref 3.5–5.1)
Sodium: 138 mmol/L (ref 135–145)
Total Bilirubin: 1 mg/dL (ref 0.3–1.2)
Total Protein: 7.7 g/dL (ref 6.5–8.1)

## 2019-05-20 LAB — LIPID PANEL
Cholesterol: 237 mg/dL — ABNORMAL HIGH (ref 0–200)
HDL: 35 mg/dL — ABNORMAL LOW (ref >40)
LDL Cholesterol: 170 mg/dL — ABNORMAL HIGH (ref 0–99)
Total CHOL/HDL Ratio: 6.8 RATIO
Triglycerides: 161 mg/dL — ABNORMAL HIGH (ref <150)
VLDL: 32 mg/dL (ref 0–40)

## 2019-05-20 MED ORDER — CARVEDILOL 25 MG PO TABS: 37.5000 mg | ORAL_TABLET | Freq: Two times a day (BID) | ORAL | 3 refills | Status: DC

## 2019-05-20 NOTE — Patient Instructions (Signed)
Medication Instructions:  Your physician has recommended you make the following change in your medication: Stop Coreg 25 mg twice daily.  Start Coreg 37.5 mg twice daily.  *If you need a refill on your cardiac medications before your next appointment, please call your pharmacy*  Lab Work: You need to have a CMET and Lipid panel done today at the medical mall. If you have labs (blood work) drawn today and your tests are completely normal, you will receive your results only by: Marland Kitchen MyChart Message (if you have MyChart) OR . A paper copy in the mail If you have any lab test that is abnormal or we need to change your treatment, we will call you to review the results.  Testing/Procedures: Your physician has requested that you have an exercise tolerance test. For further information please visit [email or web address] Please also follow instruction sheet, as given.    Follow-Up: At [facility], you and your health needs are our priority.  As part of our continuing mission to provide you with exceptional heart care, we have created designated Provider Care Teams.  These Care Teams include your primary Cardiologist (physician) and Advanced Practice Providers (APPs -  Physician Assistants and Nurse Practitioners) who all work together to provide you with the care you need, when you need it.  Your next appointment:   6 month(s)  The format for your next appointment:   In Person  Provider:    You may see Lorine Bears, MD or one of the following Advanced Practice Providers on your designated Care Team:    Nicolasa Ducking, NP  Eula Listen, PA-C  Marisue Ivan, PA-C   Other Instructions   [facility] Cardiovascular Imaging at [facility] [address] [place], [place] [address] Phone:  [phone]  May 20, 2019      Jordan Maxwell DOB: [birth year] MRN: [id number] [address] [place] [address]   Dear Mr. Epple,   You are scheduled for an Exercise  Stress Test   Please arrive 15 minutes prior to your appointment time for registration and insurance purposes.  The test will take approximately 45 minutes to complete.  How to prepare for your Exercise Stress Test: . Hold your Coreg the morning of your stress test.  You need to have a COVID test prior to the test.  We will call you with the              date and time of the test.  You will be required to wear a mask the entire time of the test. Do wear comfortable clothes (no dresses or overalls) and walking shoes, tennis shoes preferred (no heels or open toed shoes are allowed) . Do Not wear cologne, perfume, aftershave or lotions (deodorant is allowed).   If these instructions are not followed, your test will have to be rescheduled.  If you have questions or concerns about your appointment, you can call the Stress Lab at [phone].  If you cannot keep your appointment, please provide 24 hours notification to the Stress Lab, to avoid a possible $50 charge to your account.

## 2019-05-21 ENCOUNTER — Telehealth: Payer: Self-pay

## 2019-05-21 ENCOUNTER — Other Ambulatory Visit
Admission: RE | Admit: 2019-05-21 | Discharge: 2019-05-21 | Disposition: A | Discharge status: Home / Self Care | Payer: BC Managed Care – PPO | Source: Ambulatory Visit | Attending: Physician Assistant | Admitting: Physician Assistant

## 2019-05-21 ENCOUNTER — Other Ambulatory Visit: Payer: Self-pay | Admitting: Physician Assistant

## 2019-05-21 DIAGNOSIS — Z20822 Contact with and (suspected) exposure to covid-19: Secondary | ICD-10-CM | POA: Diagnosis not present

## 2019-05-21 DIAGNOSIS — I2510 Atherosclerotic heart disease of native coronary artery without angina pectoris: Secondary | ICD-10-CM

## 2019-05-21 DIAGNOSIS — Z01812 Encounter for preprocedural laboratory examination: Secondary | ICD-10-CM | POA: Insufficient documentation

## 2019-05-21 LAB — SARS CORONAVIRUS 2 (TAT 6-24 HRS): SARS Coronavirus 2: NEGATIVE

## 2019-05-21 MED ORDER — ICOSAPENT ETHYL 1 G PO CAPS: 2.0000 g | ORAL_CAPSULE | Freq: Two times a day (BID) | ORAL | 3 refills | Status: DC

## 2019-05-21 NOTE — Telephone Encounter (Signed)
Spoke to patient following lab results.   Discussed POC. Pt agreeable and orders placed as requested by Eula Listen, PA.   No further questions at this time.   Send mychart message to patient about labs.

## 2019-05-21 NOTE — Telephone Encounter (Signed)
-----   Message from Sondra Barges, PA-C sent at 05/20/2019 12:45 PM EST ----- FLP showed his LDL remains above goal of < 70. He is intolerant to statins and Zetia. He was unable to inject himself with PCSK9 inhibitor. He has been referred to the lipid clinic. He should keep that appointment. Triglycerides are elevated on fasting sample. Please send in Vascepa 2 g bid. Recheck FLP/LFT in 8 weeks.   Renal and liver function normal. Potassium at goal. Random glucose is elevated, he should follow up with his PCP for further testing and possible treatment.

## 2019-05-24 ENCOUNTER — Telehealth: Payer: Self-pay

## 2019-05-24 NOTE — Telephone Encounter (Signed)
Call to patient to make him aware of results and confirm appt for tomorrow afternoon GXT.   Pt verbalized understanding and had no further questions at this time.

## 2019-05-24 NOTE — Telephone Encounter (Signed)
-----   Message from Sondra Barges, PA-C sent at 05/24/2019  7:00 AM EST ----- Pre GXT covid negative.

## 2019-05-25 ENCOUNTER — Ambulatory Visit: Payer: BC Managed Care – PPO

## 2019-05-25 ENCOUNTER — Other Ambulatory Visit: Payer: Self-pay

## 2019-05-25 ENCOUNTER — Telehealth: Payer: Self-pay

## 2019-05-25 MED ORDER — HYDRALAZINE HCL 25 MG PO TABS: 25.0000 mg | ORAL_TABLET | Freq: Three times a day (TID) | ORAL | 3 refills | Status: DC

## 2019-05-25 NOTE — Telephone Encounter (Signed)
PA started through [employer].   KeyMauro Kaufmann PA Case ID: [id number] Rx #: [id number]  Awaiting response.

## 2019-05-25 NOTE — Telephone Encounter (Signed)
Pt arrived for treadmill stress test. Pt was consented with name and birth date.   Leads and BP cuff applied to patient and BL vs were obtained.   BP standing 166/102, HR 105 at rest. Diaphoretic at rest.   Went to Eula Listen, [place], ordering provider.   Per Alycia Rossetti, will need to delay GXT at this time d/t HTN.   Explained protocol to patient and gave advice from Eula Listen, [place].   Ordered Rx for hydralazine, sent e script to pt preferred pharmacy.   Advised pt to take BP/HR values and call the office on Friday with results. Will reschedule GXT for next week if BP improves.   Pt agreeable to POC.   Un-arrived GXT appt.  Scheduling made aware.   Pt aware that Covid test will need to be repeated for future GXT.

## 2019-05-28 ENCOUNTER — Telehealth: Payer: Self-pay | Admitting: Cardiovascular Disease

## 2019-05-28 DIAGNOSIS — I25111 Atherosclerotic heart disease of native coronary artery with angina pectoris with documented spasm: Secondary | ICD-10-CM

## 2019-05-28 DIAGNOSIS — I10 Essential (primary) hypertension: Secondary | ICD-10-CM

## 2019-05-28 MED ORDER — HYDRALAZINE HCL 50 MG PO TABS: 50.0000 mg | ORAL_TABLET | Freq: Three times a day (TID) | ORAL | Status: DC

## 2019-05-28 NOTE — Telephone Encounter (Signed)
Called the patient and asked him to provide specific days, BP and Hr readings. Please see below:  When the patient returned home on Tues he took his Carvedilol and checked his BP 15/20 min later 2/2 afternoon143/89 87 bpm 2/3 136/87 85 bpm 2/4 133/79 87 bpm 2/5 135/89 84 bpm  Advised the patient that I will fwd the BP and HR reading to Eula Listen, PA for him to review and give his recommendation. Patient agreeable with the plan.

## 2019-05-28 NOTE — Telephone Encounter (Signed)
Patient made aware of Ryan's response and recommendation. Patient has been taking all of his medications including Hydralazine as prescribed. The readings provide are with his anti-hypertensive medications on board. 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 He has been out of work the week and is anxious to return to work asap. Advised the patient that I will discuss with Alycia Rossetti and call him back.  Spoke with the Eula Listen, PA. Per Alycia Rossetti the patient is to have his COVID test early next week and his gxt can be rescheduled for mid week next week.  Patient is to hold Carvedilol the night before and the morning of the gxt. The morning of the treadmill test the pt it to take 100mg  of Hydralazine.  Advised the patient that a scheduler will contact him to coordinate his COVID test and reschedule his gxt for mid next week.  Patient verbalized understanding and voiced appreciation for the assistance.

## 2019-05-28 NOTE — Telephone Encounter (Signed)
BP is improved. Is this with the added hydralazine as well, that we started at the time he presented to the office for his GXT, that was postponed secondary to hypertension, along with amlodipine, losartan, and Coreg? If so, would have him titrate hydralazine to 50 mg tid to further optimize his BP. This will help at the time of his rescheduled GXT as well.

## 2019-05-28 NOTE — Telephone Encounter (Signed)
Patient calling in BP readings requested by provider Since Tuesday bp's averaged 133/79 with patient taking them 3x/day

## 2019-05-31 ENCOUNTER — Other Ambulatory Visit: Payer: Self-pay

## 2019-05-31 ENCOUNTER — Other Ambulatory Visit
Admission: RE | Admit: 2019-05-31 | Discharge: 2019-05-31 | Disposition: A | Discharge status: Home / Self Care | Payer: BC Managed Care – PPO | Source: Ambulatory Visit | Attending: Cardiovascular Disease | Admitting: Cardiovascular Disease

## 2019-05-31 DIAGNOSIS — Z01812 Encounter for preprocedural laboratory examination: Secondary | ICD-10-CM | POA: Diagnosis present

## 2019-05-31 DIAGNOSIS — Z20822 Contact with and (suspected) exposure to covid-19: Secondary | ICD-10-CM | POA: Diagnosis not present

## 2019-05-31 NOTE — Telephone Encounter (Signed)
Patient has cardiovascular disease. He had a NSTEMI in 2017. He has hypertension, hyperlipidemia, and tobacco use. At a minimum he has pre-diabetes. Please see if this qualifies him for Vascepa. If not, please draw an A1c when he comes in for his GXT, which may qualify him for Vascepa.

## 2019-05-31 NOTE — Telephone Encounter (Signed)
Incoming fax received to let us know that PA for Vascepa had been declined because "patient would need to meet 2 of the following conditions: - has CV disease - has DM and 2 or more additional risk factors for CV disease".   Routing to [employer], [place] to further advise.

## 2019-06-01 ENCOUNTER — Telehealth: Payer: Self-pay | Admitting: Physician Assistant

## 2019-06-01 LAB — SARS CORONAVIRUS 2 (TAT 6-24 HRS): SARS Coronavirus 2: NEGATIVE

## 2019-06-01 NOTE — Telephone Encounter (Signed)
I called and spoke with the patient to follow up on his BP over the weekend. Per the patient he has been running ~ 126/83. 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  I have reminded him of Eula Listen, PA's recommendations from Friday that the patient hold his coreg the night before and morning of the test, but that he will need to take hydralazine 100 mg in the morning at least 30 min- preferably 1 hour prior to his GXT.  The patient voices understanding and is agreeable.

## 2019-06-02 ENCOUNTER — Other Ambulatory Visit (INDEPENDENT_AMBULATORY_CARE_PROVIDER_SITE_OTHER): Payer: BC Managed Care – PPO

## 2019-06-02 ENCOUNTER — Other Ambulatory Visit: Payer: Self-pay

## 2019-06-02 ENCOUNTER — Ambulatory Visit (INDEPENDENT_AMBULATORY_CARE_PROVIDER_SITE_OTHER): Payer: BC Managed Care – PPO

## 2019-06-02 DIAGNOSIS — I25111 Atherosclerotic heart disease of native coronary artery with angina pectoris with documented spasm: Secondary | ICD-10-CM

## 2019-06-02 DIAGNOSIS — I10 Essential (primary) hypertension: Secondary | ICD-10-CM

## 2019-06-02 NOTE — Telephone Encounter (Signed)
Patient calling for status of DOT letter. Patient was told by employer that they need this today if possible.   Please call when he can pick up.

## 2019-06-03 ENCOUNTER — Telehealth: Payer: Self-pay

## 2019-06-03 ENCOUNTER — Telehealth: Payer: Self-pay | Admitting: Cardiovascular Disease

## 2019-06-03 LAB — EXERCISE TOLERANCE TEST
Estimated workload: 7.2 METS
Exercise duration (min): 5 min
Exercise duration (sec): 56 s
MPHR: 175 {beats}/min
Peak HR: 144 {beats}/min
Percent HR: 82 %
RPE: 17
Rest HR: 97 {beats}/min

## 2019-06-03 LAB — HEMOGLOBIN A1C
Est. average glucose Bld gHb Est-mCnc: 171 mg/dL
Hgb A1c MFr Bld: 7.6 % — ABNORMAL HIGH (ref 4.8–5.6)

## 2019-06-03 NOTE — Telephone Encounter (Signed)
See second encounter for same complaint

## 2019-06-03 NOTE — Telephone Encounter (Signed)
-----   Message from Sondra Barges, PA-C sent at 06/03/2019  7:21 AM EST ----- Patient has diabetes. Please advise him he needs to follow up with his PCP for therapy. Please also include this information in his Vascepa prior [place]. That should get him approved.

## 2019-06-03 NOTE — Telephone Encounter (Signed)
Await read of GXT. Preliminary report was patient was not able to achieve 85% MPHR in the office, which will make this study non-diagnostic. In this setting, we will most likely need further imaging.

## 2019-06-03 NOTE — Telephone Encounter (Signed)
PA for Vascepa resent with supporting documentation in [email or web address]. KEY: [id number]   Per [employer] denials on file. See latest letter for appeals process.

## 2019-06-03 NOTE — Telephone Encounter (Signed)
Pt dx updated per provider request and resubmitted. Jordan Maxwell (Key: [id number])  Pt also has newly dx DM but there is not currently a place for that.   One question that may keep him from getting it is that he is not currently on therapy d/t he was unable to tolerate injecting himself with Pralulent.   We may need to send more supporting data if they refuse for this reason.   Awaiting response.

## 2019-06-03 NOTE — Telephone Encounter (Signed)
Call to patient and made him aware of new diabetes dx. He reports that he doesn't currently have a PCP. I provided him with the number to [facility]. Pt appreciative of call.   I let him know I would resubmit prior auth for vascepa with this new information.   Informed patient that we do not currently have results from stress test but will call when they are available.   Advised pt to call for any further questions or concerns.

## 2019-06-03 NOTE — Telephone Encounter (Signed)
Patient's ETT was performed on 06/02/19 and is awaiting review and signature of the provider. Msg fwd to Dr. Kirke Corin and Eula Listen, PA.

## 2019-06-03 NOTE — Telephone Encounter (Signed)
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

## 2019-06-04 NOTE — Telephone Encounter (Signed)
Letter and fax sent to appeal PA denial.   Sent last OV note and result notes.   Awaiting response.

## 2019-06-04 NOTE — Telephone Encounter (Signed)
Patient calling to check the status of letter

## 2019-06-04 NOTE — Telephone Encounter (Signed)
scheduled

## 2019-06-04 NOTE — Telephone Encounter (Signed)
Reviewed results with patient. He verbalized understanding and reported that he would pick up form today.   He has reached out to [facility] and has appt for PCP next Thursday to further discuss DM dx.   Pt does not want to inc hydralazine at this time because BP has been running better at home. Today was 127/73. He will call in 1 week with BP numbers.

## 2019-06-04 NOTE — Telephone Encounter (Signed)
-----   Message from Sondra Barges, PA-C sent at 06/04/2019  9:53 AM EST ----- Stress test showed no evidence of ischemia. He did have a hypertensive response to exercise, which is consistent with his last treadmill. He achieved 82% of maximum predicted heart rate. Per DOT guidelines, given he is on a beta blocker, this still counts as a satisfactory stress test and he does not need imaging at this time.  Recommendation: -His DOT form will be signed and given to clinical staff when I am back in the office today -His recent A1c showed he has diabetes and he will need to follow up with his PCP for treatment of this. This will also need to be disclosed to his DOT examiner as they will need to see he is on therapy -Please have him titrate hydralazine to 100 mg tid and continued remaining BP medications -He should follow up for HTN in 1 month

## 2019-06-08 NOTE — Telephone Encounter (Signed)
Made call to [facility] appeals in regards to PA decline.   I spoke with technician Lupita Leash. She was able to update PA with new information (DM dx and statin allergy).   She was able to get medication approved for 36 months.   Made call to patient to make him aware that medication will be available for pick up this afternoon. I provided him with PA code for pharmacy just in case.   Pt reported that he has an appt this Thursday with new PCP for DM treatment.   Pt has an appt with Dr. Rennis Golden 3/19.  Advised pt to call for any further questions or concerns.

## 2019-06-10 ENCOUNTER — Ambulatory Visit: Payer: Self-pay | Admitting: Family Medicine

## 2019-06-11 ENCOUNTER — Ambulatory Visit: Payer: BC Managed Care – PPO | Admitting: Nurse Practitioner

## 2019-06-13 NOTE — Progress Notes (Signed)
Cardiology Office Note    Date:  06/15/2019   ID:  Jordan Maxwell, DOB 12-26-1975, MRN [id number]  PCP:  Smitty Cords, DO  Cardiologist:  Lorine Bears, MD  Electrophysiologist:  None   Chief Complaint: Follow up  History of Present Illness:   Jordan Maxwell is a 44 y.o. 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  He was admitted to the hospital in 07/2015 with a non-STEMI.  LHC showed a 95% proximal RCA stenosis with otherwise nonobstructive disease as detailed below.  He underwent successful PCI/DES.  He has subsequently undergone ETT in 05/2017 to satisfy DOT requirements which was nonischemic.  He did have a hypertensive response to exercise and was placed on losartan at that time.  He was seen virtually in 08/2018 and was doing well from a cardiac perspective.  He indicated his diet was still consisting largely of fast food.  He was compliant with medications and reported a home BP of 138/88.  He indicated he had only taken 1 dose of Praluent because he did not like sticking himself with needles.  He did not wish to try again.  He was last seen in the office on 05/20/2019 for routine follow up and was doing well.  He was trying to work on a healthier diet.  BP 138/102.  Labs showed elevated lipid panel as below and he was subsequently referred to the lipid clinic.  He was also found to be a newly diagnosed diabetic and referred to his PCP for follow up and management.  He underwent ETT, per DOT guidelines, which showed no significant st/t changes at peak stress or recovery with a hypertensive response to exercise.  He ws able to exercise for 6 minutes, achieving 7.2 METs.  He did achieve 82% MPHR, on beta blocker therapy.  His  hydralazine was titrated to 100 mg tid.   He comes in doing very well today.  He denies any chest pain, shortness of breath, palpitations, dizziness, presyncope, syncope, lower extreme swelling, falls, hematochezia, or melena.  Blood pressure has been running in the 120s over 80s at home following titration of hydralazine.  He is compliant with all medications.  He did see his PCP on 2/22 for evaluation of his recent A1c of 7.6.  Medications have been deferred at this time with recommendation for patient to continue working on a heart healthy diet.  He does not have any issues or concerns at this time.   Labs independently reviewed: 05/2019 - A1c 7.6 04/2019 - potassium 4.5, BUN 12, SCr 0.94, albumin 4.1, AST/ALT normal, TC 237, TG 161, HDL 35, LDL 170 04/2017 - Hgb 16.7, PLT 272  Past Medical History:  Diagnosis Date   CAD (coronary artery disease)    a. 07/2015 NSTEMI/Cath: 95% stenosed pRCA s/p PCI/DES, o/w angiographically nl coronary arteries, LVSF 55-65%, no MS/AS; b. 05/2017 ETT (DOT req): Ex time 7:48, no ST/T changes. HTN response.   H/O medication noncompliance    Hyperlipidemia    Hypertension    LVH (left ventricular hypertrophy)    a. echo 07/30/2015: EF 55-65%, no RWMA, LV diastolic fxn nl, LA nl in size, PASP nl; b. 05/2017 Echo: EF 60-65%, no wrma. Nl RV fxn.   NSTEMI (non-ST elevated myocardial infarction) (HCC) 07/30/2015  Tobacco abuse     Past Surgical History:  Procedure Laterality Date   CARDIAC CATHETERIZATION N/A 07/31/2015   Procedure: Left Heart Cath and Coronary Angiography;  Surgeon: Iran Ouch, MD;  Location: [facility] INVASIVE CV LAB;  Service: Cardiovascular;  Laterality: N/A;   CARDIAC CATHETERIZATION N/A 07/31/2015   Procedure: Coronary Stent Intervention;  Surgeon: Iran Ouch, MD;  Location: [facility] INVASIVE CV LAB;  Service: Cardiovascular;  Laterality: N/A;    Current Medications: Current Meds  Medication Sig   aspirin 81 MG EC tablet Take 1  tablet (81 mg total) by mouth daily.   carvedilol (COREG) 25 MG tablet Take 1.5 tablets (37.5 mg total) by mouth 2 (two) times daily with a meal.   Evolocumab (REPATHA SURECLICK) 140 MG/ML SOAJ Inject 140 mg into the skin every 14 (fourteen) days.   hydrALAZINE (APRESOLINE) 50 MG tablet Take 1 tablet (50 mg total) by mouth 3 (three) times daily.   icosapent Ethyl (VASCEPA) 1 g capsule Take 2 capsules (2 g total) by mouth 2 (two) times daily.   losartan (COZAAR) 100 MG tablet Take 1 tablet by mouth once daily   nitroGLYCERIN (NITROSTAT) 0.4 MG SL tablet Place 1 tablet (0.4 mg total) under the tongue every 5 (five) minutes as needed for chest pain.    Allergies:   Eggs or egg-derived products and Statins   Social History   Socioeconomic History   Marital status: Single    Spouse name: Not on file   Number of children: 4   Years of education: Not on file   Highest education level: Not on file  Occupational History   Occupation: [employer] doctor    Comment: [employer]  Tobacco Use   Smoking status: Current Every Day Smoker    Packs/day: 0.50    Years: 26.00    Pack years: 13.00    Types: Cigarettes   Smokeless tobacco: Current User    Types: Chew   Tobacco comment: Pack every two days  Substance and Sexual Activity   Alcohol use: Yes    Alcohol/week: 0.0 standard drinks    Comment: ocassionally   Drug use: Yes    Types: Marijuana    Comment: past   Sexual activity: Yes    Partners: Female    Birth control/protection: None  Other Topics Concern   Not on file  Social History Narrative   Not on file   Social Determinants of Health   Financial Resource Strain:    Difficulty of Paying Living Expenses: Not on file  Food Insecurity:    Worried About Programme researcher, broadcasting/film/video in the Last Year: Not on file   [employer] of Food in the Last Year: Not on file  Transportation Needs:    Lack of Transportation (Medical): Not on file   Lack of Transportation  (Non-Medical): Not on file  Physical Activity:    Days of Exercise per Week: Not on file   Minutes of Exercise per Session: Not on file  Stress:    Feeling of Stress : Not on file  Social Connections:    Frequency of Communication with Friends and Family: Not on file   Frequency of Social Gatherings with Friends and Family: Not on file   Attends Religious Services: Not on file   Active Member of Clubs or Organizations: Not on file   Attends Banker Meetings: Not on file   Marital Status: Not on file     Family History:  The patient's  family history includes Diabetes in his mother; Hypertension in his mother.  ROS:   Review of Systems  Constitutional: Negative for chills, diaphoresis, fever, malaise/fatigue and weight loss.  HENT: Negative for congestion.   Eyes: Negative for discharge and redness.  Respiratory: Negative for cough, sputum production, shortness of breath and wheezing.   Cardiovascular: Negative for chest pain, palpitations, orthopnea, claudication, leg swelling and PND.  Gastrointestinal: Negative for abdominal pain, blood in stool, heartburn, melena, nausea and vomiting.  Musculoskeletal: Negative for falls and myalgias.  Skin: Negative for rash.  Neurological: Negative for dizziness, tingling, tremors, sensory change, speech change, focal weakness, loss of consciousness and weakness.  Endo/Heme/Allergies: Does not bruise/bleed easily.  Psychiatric/Behavioral: Negative for substance abuse. The patient is not nervous/anxious.   All other systems reviewed and are negative.    EKGs/Labs/Other Studies Reviewed:    Studies reviewed were summarized above. The additional studies were reviewed today:  LHC 07/2015:  The left ventricular systolic function is normal.  Prox RCA lesion, 95% stenosed. Post intervention, there is a 0% residual stenosis.  1. Severe one-vessel coronary artery disease with 95% hazy proximal RCA stenosis. This is likely  the culprit for non-ST elevation myocardial infarction. 2. Normal LV systolic function and mildly elevated left ventricular end-diastolic pressure. 3. Successful angioplasty and drug-eluting stent placement to the proximal right coronary artery.  Recommendations: Dual antiplatelet therapy for at least 6 months and ideally for 12 months. The patient will need assistance with his medications. Brilinta can be switched to Plavix after one month. I switched amlodipine to lisinopril for cost reasons and added carvedilol. Smoking cessation is strongly advised. Discharge home tomorrow if stable. __________  2D echo 07/2015: - Procedure narrative: Transthoracic echocardiography. Image quality was poor. The study was technically difficult, as a result of poor sound wave transmission and body habitus. - Left ventricle: The cavity size was normal. Wall thickness was increased in a pattern of mild LVH. Systolic function was normal. The estimated ejection fraction was in the range of 55% to 65%. Wall motion was normal; there were no regional wall motion abnormalities. Left ventricular diastolic function parameters were normal. - Aortic valve: Valve area (Vmax): 1.85 cm^2. - Left atrium: The atrium was normal in size. - Right ventricle: Systolic function was normal. - Pulmonary arteries: Systolic pressure was within the normal range. __________  ETT 08/2015:  There was no ST segment deviation noted during stress.  No T wave inversion was noted during stress.  Normal treadmill stress test. Good exercise capacity. He exercised for 8 minutes. Mildly elevated blood pressure at baseline with mild hypertensive response to exercise. Low risk Duke treadmill score. __________  ETT 05/2017:  Blood pressure demonstrated a hypertensive response to exercise.  There was no ST segment deviation noted during stress.  No T wave inversion was noted during stress.  Normal treadmill  stress test with no evidence of ischemia. Average exercise capacity with exercise duration of 7 minutes and 48 seconds. Elevated blood pressure at baseline at 165/101 with hypertensive response to exercise at 218/95 __________  Limited echo 05/2017: - Left ventricle: The cavity size was normal. There was mild concentric hypertrophy. Systolic function was normal. The estimated ejection fraction was in the range of 60% to 65%. Wall motion was normal; there were no regional wall motion abnormalities. The study is not technically sufficient to allow evaluation of LV diastolic function. - Left atrium: The atrium was normal in size. - Right ventricle: Systolic function was normal. - Pulmonary arteries:  Systolic pressure could not be accurately estimated. __________  ETT 2/21:  Blood pressure demonstrated a hypertensive response to exercise.   Normal treadmill stress test at 82% MPHR Hypertensive response to exercise with peak BP of 243/89 Below average exercise capacity achieving 7.2 mets and exercise duration of 6 minutes.    EKG:  EKG is ordered today.  The EKG ordered today demonstrates NSR, 86 bpm, normal axis, no acute ST-T changes  Recent Labs: 05/20/2019: ALT 26; BUN 12; Creatinine, Ser 0.94; Potassium 4.5; Sodium 138  Recent Lipid Panel    Component Value Date/Time   CHOL 237 (H) 05/20/2019 0956   CHOL 269 (H) 04/09/2018 1050   TRIG 161 (H) 05/20/2019 0956   HDL 35 (L) 05/20/2019 0956   HDL 41 04/09/2018 1050   CHOLHDL 6.8 05/20/2019 0956   VLDL 32 05/20/2019 0956   LDLCALC 170 (H) 05/20/2019 0956   LDLCALC 199 (H) 04/09/2018 1050    PHYSICAL EXAM:    VS:  BP 120/80 (BP Location: Left Arm, Patient Position: Sitting, Cuff Size: Normal)    Pulse 86    Ht 6' (1.829 m)    Wt 288 lb 12 oz (131 kg)    SpO2 97%    BMI 39.16 kg/m   BMI: Body mass index is 39.16 kg/m.  Physical Exam  Constitutional: He is oriented to person, place, and time. He appears  well-developed and well-nourished.  HENT:  Head: Normocephalic and atraumatic.  Eyes: Right eye exhibits no discharge. Left eye exhibits no discharge.  Neck: No JVD present.  Cardiovascular: Normal rate, regular rhythm, S1 normal, S2 normal and normal heart sounds. Exam reveals no distant heart sounds, no friction rub, no midsystolic click and no opening snap.  No murmur heard. Pulses:      Posterior tibial pulses are 2+ on the right side and 2+ on the left side.  Pulmonary/Chest: Effort normal and breath sounds normal. No respiratory distress. He has no decreased breath sounds. He has no wheezes. He has no rales. He exhibits no tenderness.  Abdominal: Soft. He exhibits no distension. There is no abdominal tenderness.  Musculoskeletal:        General: No edema.     Cervical back: Normal range of motion.  Neurological: He is alert and oriented to person, place, and time.  Skin: Skin is warm and dry. No cyanosis. Nails show no clubbing.  Psychiatric: He has a normal mood and affect. His speech is normal and behavior is normal. Judgment and thought content normal.    Wt Readings from Last 3 Encounters:  06/15/19 288 lb 12 oz (131 kg)  06/14/19 288 lb (130.6 kg)  05/20/19 292 lb 6 oz (132.6 kg)     ASSESSMENT & PLAN:   1. HTN: Blood pressure is much better controlled today at 120/80.  Continue current medications including carvedilol, amlodipine, losartan, and hydralazine.  Low-sodium diet recommended.  Weight loss advised.  Complete cessation of alcohol recommended.   2. CAD involving the native coronary arteries without angina: He is doing well without symptoms concerning for angina.  Recent ETT earlier this month without changes concerning for ischemia.  Continue secondary prevention with aspirin, amlodipine, carvedilol, and losartan.  He is intolerant to high intensity statins/Zetia with myalgias as well as PCSK9 inhibitor secondary to needle phobia.  He has recently been started on  Vascepa secondary to hypertriglyceridemia and referred to the lipid clinic.  3. HLD: Intolerant to statins/Zetia.  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  Having his wife give him the injection is not an option.  Most recent TG/LDL of 161/170, respectively.  Goal LDL < 70.  He has been started on Vascepa and has been referred to the lipid clinic for consideration of bempedoic acid.    He has an appointment with Dr. Rennis Golden on 3/19.  4. Recently diagnosed DM: He saw his PCP on 2/22 with recommendation for lifestyle modification and repeat A1c in 3 months.  Medications have been deferred at this time.  5. Tobacco use: Complete cessation is advised.   6. Alcohol use: He indicates he has not had more than possibly a shot in the past 2 weeks.  Continue tapering to complete cessation.  7. Obesity: Weight loss advised.  Discussed heart healthy diet.  Disposition: F/u with Dr. Kirke Corin or an APP in 6 months.   Medication Adjustments/Labs and Tests Ordered: Current medicines are reviewed at length with the patient today.  Concerns regarding medicines are outlined above. Medication changes, Labs and Tests ordered today are summarized above and listed in the Patient Instructions accessible in Encounters.   Signed, Eula Listen, PA-C 06/15/2019 8:16 AM     [facility] [place] [address] [place], [place] [address] [phone]

## 2019-06-14 ENCOUNTER — Encounter: Payer: Self-pay | Admitting: Family Medicine

## 2019-06-14 ENCOUNTER — Other Ambulatory Visit: Payer: Self-pay

## 2019-06-14 ENCOUNTER — Ambulatory Visit (INDEPENDENT_AMBULATORY_CARE_PROVIDER_SITE_OTHER): Payer: BC Managed Care – PPO | Admitting: Family Medicine

## 2019-06-14 VITALS — BP 124/76 | HR 88 | Temp 98.6°F | Resp 16 | Ht 72.0 in | Wt 288.0 lb

## 2019-06-14 DIAGNOSIS — F17200 Nicotine dependence, unspecified, uncomplicated: Secondary | ICD-10-CM

## 2019-06-14 DIAGNOSIS — I2510 Atherosclerotic heart disease of native coronary artery without angina pectoris: Secondary | ICD-10-CM | POA: Diagnosis not present

## 2019-06-14 DIAGNOSIS — Z7689 Persons encountering health services in other specified circumstances: Secondary | ICD-10-CM | POA: Diagnosis not present

## 2019-06-14 DIAGNOSIS — I10 Essential (primary) hypertension: Secondary | ICD-10-CM | POA: Diagnosis not present

## 2019-06-14 DIAGNOSIS — R7309 Other abnormal glucose: Secondary | ICD-10-CM | POA: Diagnosis not present

## 2019-06-14 DIAGNOSIS — E785 Hyperlipidemia, unspecified: Secondary | ICD-10-CM

## 2019-06-14 NOTE — Assessment & Plan Note (Signed)
Considered morbid obesity based on BMI >39 but with Hyperlipidemia, Hypertension, Elevated A1c, History of heart disease

## 2019-06-14 NOTE — Patient Instructions (Addendum)
Thank you for coming to the office today.  For smoking cessation 1 [id number]-QUIT NOW  ----------------------  Elevated A1c at risk of Type 2 Diabetes  Recent Labs    06/02/19 0916  HGBA1C 7.6*    Eat at least 3 meals and 1-2 snacks per day (don't skip breakfast).  Aim for no more than 5 hours between eating. - Tip: If you go >5 hours without eating and become very hungry, your body will supply it's own resources temporarily and you can gain extra weight when you eat.  The 5 Minute Rule of Exercise - Promise yourself to at least do 5 minutes of exercise (make sure you time it), and if at the end of 5 minutes (this is the hardest part of the work-out), if you still feel like you want stop (or not motivated to continue) then allow yourself to stop. Otherwise, more often than not you will feel encouraged that you can continue for a little while longer or even more!   Diet Recommendations for Diabetes   LIMIT Starchy (carb) foods include: Bread, rice, pasta, potatoes, corn, crackers, bagels, muffins, all baked goods.   INCREASE Protein foods include: Meat, fish, poultry, eggs, dairy foods, and beans such as pinto and kidney beans (beans also provide carbohydrate).   1. Eat at least 3 meals and 1-2 snacks per day. Never go more than 4-5 hours while awake without eating.   2. Limit starchy foods to TWO per meal and ONE per snack. ONE portion of a starchy  food is equal to the following:   - ONE slice of bread (or its equivalent, such as half of a hamburger bun).   - 1/2 cup of a "scoopable" starchy food such as potatoes or rice.   - 1 OUNCE (28 grams) of starchy snacks (crackers or pretzels, look on label).   - 15 grams of carbohydrate as shown on food label.   3. Both lunch and dinner should include a protein food, a carb food, and vegetables.   - Obtain twice as many veg's as protein or carbohydrate foods for both lunch and dinner.   - Try to keep frozen veg's on hand for a quick  vegetable serving.     - Fresh or frozen veg's are best.   4. Breakfast should always include protein.   ----  Please schedule a Follow-up Appointment to: Return in about 3 months (around 09/11/2019) for 3 month PreDM A1c.  If you have any other questions or concerns, please feel free to call the office or send a message through [place]. You may also schedule an earlier appointment if necessary.  Additionally, you may be receiving a survey about your experience at our office within a few days to 1 week by e-mail or mail. We value your feedback.  Nobie Putnam, DO [facility], [facility]   Diabetes Mellitus and Nutrition, Adult When you have diabetes (diabetes mellitus), it is very important to have healthy eating habits because your blood sugar (glucose) levels are greatly affected by what you eat and drink. Eating healthy foods in the appropriate amounts, at about the same times every day, can help you:  Control your blood glucose.  Lower your risk of heart disease.  Improve your blood pressure.  Reach or maintain a healthy weight. Every person with diabetes is different, and each person has different needs for a meal plan. Your health care provider may recommend that you work with a diet and nutrition specialist (dietitian) to  make a meal plan that is best for you. Your meal plan may vary depending on factors such as:  The calories you need.  The medicines you take.  Your weight.  Your blood glucose, blood pressure, and cholesterol levels.  Your activity level.  Other health conditions you have, such as heart or kidney disease. How do carbohydrates affect me? Carbohydrates, also called carbs, affect your blood glucose level more than any other type of food. Eating carbs naturally raises the amount of glucose in your blood. Carb counting is a method for keeping track of how many carbs you eat. Counting carbs is important to keep your blood glucose at a  healthy level, especially if you use insulin or take certain oral diabetes medicines. It is important to know how many carbs you can safely have in each meal. This is different for every person. Your dietitian can help you calculate how many carbs you should have at each meal and for each snack. Foods that contain carbs include:  Bread, cereal, rice, pasta, and crackers.  Potatoes and corn.  Peas, beans, and lentils.  Milk and yogurt.  Fruit and juice.  Desserts, such as cakes, cookies, ice cream, and candy. How does alcohol affect me? Alcohol can cause a sudden decrease in blood glucose (hypoglycemia), especially if you use insulin or take certain oral diabetes medicines. Hypoglycemia can be a life-threatening condition. Symptoms of hypoglycemia (sleepiness, dizziness, and confusion) are similar to symptoms of having too much alcohol. If your health care provider says that alcohol is safe for you, follow these guidelines:  Limit alcohol intake to no more than 1 drink per day for nonpregnant women and 2 drinks per day for men. One drink equals 12 oz of beer, 5 oz of wine, or 1 oz of hard liquor.  Do not drink on an empty stomach.  Keep yourself hydrated with water, diet soda, or unsweetened iced tea.  Keep in mind that regular soda, juice, and other mixers may contain a lot of sugar and must be counted as carbs. What are tips for following this plan?  Reading food labels  Start by checking the serving size on the "Nutrition Facts" label of packaged foods and drinks. The amount of calories, carbs, fats, and other nutrients listed on the label is based on one serving of the item. Many items contain more than one serving per package.  Check the total grams (g) of carbs in one serving. You can calculate the number of servings of carbs in one serving by dividing the total carbs by 15. For example, if a food has 30 g of total carbs, it would be equal to 2 servings of carbs.  Check the  number of grams (g) of saturated and trans fats in one serving. Choose foods that have low or no amount of these fats.  Check the number of milligrams (mg) of salt (sodium) in one serving. Most people should limit total sodium intake to less than 2,300 mg per day.  Always check the nutrition information of foods labeled as "low-fat" or "nonfat". These foods may be higher in added sugar or refined carbs and should be avoided.  Talk to your dietitian to identify your daily goals for nutrients listed on the label. Shopping  Avoid buying canned, premade, or processed foods. These foods tend to be high in fat, sodium, and added sugar.  Shop around the outside edge of the grocery store. This includes fresh fruits and vegetables, bulk grains, fresh meats, and  fresh dairy. Cooking  Use low-heat cooking methods, such as baking, instead of high-heat cooking methods like deep frying.  Cook using healthy oils, such as olive, canola, or sunflower oil.  Avoid cooking with butter, cream, or high-fat meats. Meal planning  Eat meals and snacks regularly, preferably at the same times every day. Avoid going long periods of time without eating.  Eat foods high in fiber, such as fresh fruits, vegetables, beans, and whole grains. Talk to your dietitian about how many servings of carbs you can eat at each meal.  Eat 4-6 ounces (oz) of lean protein each day, such as lean meat, chicken, fish, eggs, or tofu. One oz of lean protein is equal to: ? 1 oz of meat, chicken, or fish. ? 1 egg. ?  cup of tofu.  Eat some foods each day that contain healthy fats, such as avocado, nuts, seeds, and fish. Lifestyle  Check your blood glucose regularly.  Exercise regularly as told by your health care provider. This may include: ? 150 minutes of moderate-intensity or vigorous-intensity exercise each week. This could be brisk walking, biking, or water aerobics. ? Stretching and doing strength exercises, such as yoga or  weightlifting, at least 2 times a week.  Take medicines as told by your health care provider.  Do not use any products that contain nicotine or tobacco, such as cigarettes and e-cigarettes. If you need help quitting, ask your health care provider.  Work with a Veterinary surgeon or diabetes educator to identify strategies to manage stress and any emotional and social challenges. Questions to ask a health care provider  Do I need to meet with a diabetes educator?  Do I need to meet with a dietitian?  What number can I call if I have questions?  When are the best times to check my blood glucose? Where to find more information:  American Diabetes Association: diabetes.org  Academy of Nutrition and Dietetics: www.eatright.[employer] of Diabetes and Digestive and Kidney Diseases (NIH): CarFlippers.tn Summary  A healthy meal plan will help you control your blood glucose and maintain a healthy lifestyle.  Working with a diet and nutrition specialist (dietitian) can help you make a meal plan that is best for you.  Keep in mind that carbohydrates (carbs) and alcohol have immediate effects on your blood glucose levels. It is important to count carbs and to use alcohol carefully. This information is not intended to replace advice given to you by your health care provider. Make sure you discuss any questions you have with your health care provider. Document Revised: 03/21/2017 Document Reviewed: 05/13/2016 Elsevier Patient Education  2020 [employer].

## 2019-06-14 NOTE — Progress Notes (Signed)
Subjective:    Patient ID: Jordan Maxwell, male    DOB: 01-25-1976, 44 y.o.   MRN: [id number]  Jordan Maxwell is a 44 y.o. male presenting on 06/14/2019 for Establish Care, Hypertension, Hyperlipidemia, and Elevated A1c Blood Sugar  Previously without PCP. Followed by Cardiologist, had MI in 2017. He now had recent elevated A1c >7 and was asked to see new PCP.  HPI   CHRONIC HTN: Reports history of elevated BP. Has fam history of HTN. Taking meds now with good results but has side effect of ED he believes. Current Meds - Amlodipine 10mg  daily, Coreg 37.5mg  BID (1.5 of the 25mg  tab), Hydralazine 50mg  TID, Losartan 100mg  daily   Reports good compliance, took meds today. Tolerating well, w/o complaints. Denies CP, dyspnea, HA, edema, dizziness / lightheadedness  HYPERLIPIDEMIA: - Last lipid panel 04/2019, elevated LDL 170, not at goal. Previously on Vascepa Repatha  Elevated A1c New identified elevated A1c to 7.6, previously was in 5 range back in 2017. He has fam history of DM No other prior dx of PreDM. He was not aware of this problem until now. He is not following low carb or DM diet.  CAD, s/p MI (2017) stent placement. Reviewed past history, followed by Cardiology [facility] on med management after Cardiac cath PCI stent place in 2017. Has next apt with Cardiology tomorrow.  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  Tobacco Abuse Active smoker and dip half pack daily. 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 29 years.  Alcohol consumption Drinks alcohol once a week, larger amount at one time.   Depression screen [facility] 2/9 06/14/2019 11/25/2014  Decreased Interest 0 0  Down, Depressed, Hopeless 0 0  PHQ - 2 Score 0 0    Past Medical History:  Diagnosis Date  . CAD (coronary artery disease)    a. 07/2015 NSTEMI/Cath: 95% stenosed pRCA s/p PCI/DES, o/w angiographically nl coronary arteries, LVSF  55-65%, no MS/AS; b. 05/2017 ETT (DOT req): Ex time 7:48, no ST/T changes. HTN response.  . H/O medication noncompliance   . Hyperlipidemia   . Hypertension   . LVH (left ventricular hypertrophy)    a. echo 07/30/2015: EF 55-65%, no RWMA, LV diastolic fxn nl, LA nl in size, PASP nl; b. 05/2017 Echo: EF 60-65%, no wrma. Nl RV fxn.  . NSTEMI (non-ST elevated myocardial infarction) (HCC) 07/30/2015  . Tobacco abuse    Past Surgical History:  Procedure Laterality Date  . CARDIAC CATHETERIZATION N/A 07/31/2015   Procedure: Left Heart Cath and Coronary Angiography;  Surgeon: 09/29/2015, MD;  Location: [facility] INVASIVE CV LAB;  Service: Cardiovascular;  Laterality: N/A;  . CARDIAC CATHETERIZATION N/A 07/31/2015   Procedure: Coronary Stent Intervention;  Surgeon: 09/29/2015, MD;  Location: [facility] INVASIVE CV LAB;  Service: Cardiovascular;  Laterality: N/A;   Social History   Socioeconomic History  . Marital status: Single    Spouse name: Not on file  . Number of children: 4  . Years of education: Not on file  . Highest education level: Not on file  Occupational History  . 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 Smoking status: Current Every Day Smoker    Packs/day: 0.50    Years: 26.00    Pack years: 13.00    Types: Cigarettes  . Smokeless tobacco: Current User    Types:  Chew  . Tobacco comment: Pack every two days  Substance and Sexual Activity  . Alcohol use: Yes    Alcohol/week: 0.0 standard drinks    Comment: ocassionally  . Drug use: Yes    Types: Marijuana    Comment: past  . Sexual activity: Yes    Partners: Female    Birth control/protection: None  Other Topics Concern  . Not on file  Social History Narrative  . Not on file   Social Determinants of Health   Financial Resource Strain:   . Difficulty of Paying Living Expenses: Not on file  Food Insecurity:   . Worried About Charity fundraiser in the Last Year: Not on file  . Ran Out  of Food in the Last Year: Not on file  Transportation Needs:   . Lack of Transportation (Medical): Not on file  . Lack of Transportation (Non-Medical): Not on file  Physical Activity:   . Days of Exercise per Week: Not on file  . Minutes of Exercise per Session: Not on file  Stress:   . Feeling of Stress : Not on file  Social Connections:   . Frequency of Communication with Friends and Family: Not on file  . Frequency of Social Gatherings with Friends and Family: Not on file  . Attends Religious Services: Not on file  . Active Member of Clubs or Organizations: Not on file  . Attends Archivist Meetings: Not on file  . Marital Status: Not on file  Intimate Partner Violence:   . Fear of Current or Ex-Partner: Not on file  . Emotionally Abused: Not on file  . Physically Abused: Not on file  . Sexually Abused: Not on file   Family History  Problem Relation Age of Onset  . Hypertension Mother   . Diabetes Mother    Current Outpatient Medications on File Prior to Visit  Medication Sig  . amLODipine (NORVASC) 10 MG tablet Take 1 tablet (10 mg total) by mouth daily.  Marland Kitchen aspirin 81 MG EC tablet Take 1 tablet (81 mg total) by mouth daily.  . carvedilol (COREG) 25 MG tablet Take 1.5 tablets (37.5 mg total) by mouth 2 (two) times daily with a meal.  . hydrALAZINE (APRESOLINE) 50 MG tablet Take 1 tablet (50 mg total) by mouth 3 (three) times daily.  Marland Kitchen losartan (COZAAR) 100 MG tablet Take 1 tablet by mouth once daily  . nitroGLYCERIN (NITROSTAT) 0.4 MG SL tablet Place 1 tablet (0.4 mg total) under the tongue every 5 (five) minutes as needed for chest pain.  . Evolocumab (REPATHA SURECLICK) 413 MG/ML SOAJ Inject 140 mg into the skin every 14 (fourteen) days. (Patient not taking: Reported on 09/09/2018)  . icosapent Ethyl (VASCEPA) 1 g capsule Take 2 capsules (2 g total) by mouth 2 (two) times daily. (Patient not taking: Reported on 06/14/2019)   No current facility-administered  medications on file prior to visit.    Review of Systems Per HPI unless specifically indicated above      Objective:    BP 124/76   Pulse 88   Temp 98.6 F (37 C) (Oral)   Resp 16   Ht 6' (1.829 m)   Wt 288 lb (130.6 kg)   SpO2 99%   BMI 39.06 kg/m   Wt Readings from Last 3 Encounters:  06/14/19 288 lb (130.6 kg)  05/20/19 292 lb 6 oz (132.6 kg)  09/09/18 284 lb (128.8 kg)    Physical Exam Vitals and nursing note  reviewed.  Constitutional:      General: He is not in acute distress.    Appearance: He is well-developed. He is not diaphoretic.     Comments: Well-appearing, comfortable, cooperative  HENT:     Head: Normocephalic and atraumatic.  Eyes:     General:        Right eye: No discharge.        Left eye: No discharge.     Conjunctiva/sclera: Conjunctivae normal.  Cardiovascular:     Rate and Rhythm: Normal rate.  Pulmonary:     Effort: Pulmonary effort is normal.  Skin:    General: Skin is warm and dry.     Findings: No erythema or rash.  Neurological:     Mental Status: He is alert and oriented to person, place, and time.  Psychiatric:        Behavior: Behavior normal.     Comments: Well groomed, good eye contact, normal speech and thoughts       Results for orders placed or performed in visit on 06/02/19  HgB A1c  Result Value Ref Range   Hgb A1c MFr Bld 7.6 (H) 4.8 - 5.6 %   Est. average glucose Bld gHb Est-mCnc 171 mg/dL      Assessment & Plan:   Problem List Items Addressed This Visit    Smoker   Morbid obesity (HCC)    Considered morbid obesity based on BMI >39 but with Hyperlipidemia, Hypertension, Elevated A1c, History of heart disease      Hypertension goal BP (blood pressure) < 140/90   Hyperlipidemia LDL goal <70   CAD (coronary artery disease)    Other Visit Diagnoses    Elevated hemoglobin A1c    -  Primary   Encounter to establish care with new doctor          HTN, HLD, CAD / PreDM vs New dx DM Discussed background  history and fam history  Reviewed previous lab results w/ patient Encouraged improvement to lifestyle with diet and exercise -Goal of weight loss -Handout given and detailed instructions on low carb diabetic diet now  Reviewed diet strategy with packing healthy snacks for road/driving, and limit carbs with buns/bread fries while eating out. Improve water hydration. Avoid large late night meal and sleep immediately after  - Emphasis on lifestyle modification for aggressive intervention in 3 months, and will defer medications for Diabetes and defer dx until repeat A1c in 3 months, to determine if new diabetic and then would offer Metformin and other medications anticipate he will be an excellent candidate for GLP1 therapy due to history of heart disease.  Continue current rx medications per cardiology  He has rx of NTG. Not using often.  Advised he can discuss ED (med side effect and also medical history likely factor) with his Cardiologist, if determined he is healthy enough to take PDE5 Sildenafil we can offer this rx in future.    No orders of the defined types were placed in this encounter.     Follow up plan: Return in about 3 months (around 09/11/2019) for 3 month PreDM A1c.  Saralyn Pilar, DO [facility] 06/14/2019, 3:12 PM

## 2019-06-15 ENCOUNTER — Ambulatory Visit (INDEPENDENT_AMBULATORY_CARE_PROVIDER_SITE_OTHER): Payer: BC Managed Care – PPO | Admitting: Physician Assistant

## 2019-06-15 ENCOUNTER — Encounter: Payer: Self-pay | Admitting: Physician Assistant

## 2019-06-15 VITALS — BP 120/80 | HR 86 | Ht 72.0 in | Wt 288.8 lb

## 2019-06-15 DIAGNOSIS — I2510 Atherosclerotic heart disease of native coronary artery without angina pectoris: Secondary | ICD-10-CM

## 2019-06-15 DIAGNOSIS — E119 Type 2 diabetes mellitus without complications: Secondary | ICD-10-CM | POA: Diagnosis not present

## 2019-06-15 DIAGNOSIS — I10 Essential (primary) hypertension: Secondary | ICD-10-CM

## 2019-06-15 DIAGNOSIS — Z789 Other specified health status: Secondary | ICD-10-CM

## 2019-06-15 DIAGNOSIS — E785 Hyperlipidemia, unspecified: Secondary | ICD-10-CM

## 2019-06-15 DIAGNOSIS — Z720 Tobacco use: Secondary | ICD-10-CM

## 2019-06-15 DIAGNOSIS — Z7289 Other problems related to lifestyle: Secondary | ICD-10-CM

## 2019-06-15 NOTE — Patient Instructions (Signed)
Medication Instructions:  Your physician recommends that you continue on your current medications as directed. Please refer to the Current Medication list given to you today.   *If you need a refill on your cardiac medications before your next appointment, please call your pharmacy*  Lab Work: None ordered  If you have labs (blood work) drawn today and your tests are completely normal, you will receive your results only by: . MyChart Message (if you have MyChart) OR . A paper copy in the mail If you have any lab test that is abnormal or we need to change your treatment, we will call you to review the results.  Testing/Procedures: None ordered   Follow-Up: At [facility], you and your health needs are our priority.  As part of our continuing mission to provide you with exceptional heart care, we have created designated Provider Care Teams.  These Care Teams include your primary Cardiologist (physician) and Advanced Practice Providers (APPs -  Physician Assistants and Nurse Practitioners) who all work together to provide you with the care you need, when you need it.  Your next appointment:   6 month(s)  The format for your next appointment:   In Person  Provider:    You may see Muhammad Arida, MD or Ryan Dunn, PA-C.  

## 2019-07-09 ENCOUNTER — Ambulatory Visit: Payer: BC Managed Care – PPO | Admitting: Internal Medicine

## 2019-09-08 ENCOUNTER — Other Ambulatory Visit: Payer: Self-pay

## 2019-09-08 MED ORDER — AMLODIPINE BESYLATE 10 MG PO TABS: 10.0000 mg | ORAL_TABLET | Freq: Every day | ORAL | 3 refills | Status: DC

## 2019-09-09 ENCOUNTER — Other Ambulatory Visit: Payer: Self-pay

## 2019-09-09 ENCOUNTER — Encounter: Payer: Self-pay | Admitting: Internal Medicine

## 2019-09-09 ENCOUNTER — Ambulatory Visit: Payer: BC Managed Care – PPO | Admitting: Internal Medicine

## 2019-09-09 VITALS — BP 125/78 | HR 82 | Temp 97.2°F | Ht 73.0 in | Wt 280.0 lb

## 2019-09-09 DIAGNOSIS — I2510 Atherosclerotic heart disease of native coronary artery without angina pectoris: Secondary | ICD-10-CM

## 2019-09-09 DIAGNOSIS — T466X5A Adverse effect of antihyperlipidemic and antiarteriosclerotic drugs, initial encounter: Secondary | ICD-10-CM

## 2019-09-09 DIAGNOSIS — E785 Hyperlipidemia, unspecified: Secondary | ICD-10-CM | POA: Diagnosis not present

## 2019-09-09 DIAGNOSIS — M791 Myalgia, unspecified site: Secondary | ICD-10-CM

## 2019-09-09 DIAGNOSIS — E119 Type 2 diabetes mellitus without complications: Secondary | ICD-10-CM | POA: Diagnosis not present

## 2019-09-09 MED ORDER — REPATHA PUSHTRONEX SYSTEM 420 MG/3.5ML ~~LOC~~ SOCT: 1.0000 | SUBCUTANEOUS | 11 refills | Status: DC

## 2019-09-09 NOTE — Patient Instructions (Signed)
Medication Instructions:  Dr. Rennis Golden recommends Repatha Pushtronex (PCSK9). This is an injectable cholesterol medication self-administered once every 30 days. This medication will need prior approval with your insurance company, which we will work on. If the medication is not approved initially, we may need to do an appeal with your insurance. We will keep you updated on this process.   Administer medication in area of fatty tissue such as abdomen, outer thigh, back up of arm - and rotate site with each injection Store medication in refrigerator until ready to administer - allow to sit at room temp for 30 mins - 1 hour prior to injection Dispose of medication in a SHARPS container - your pharmacy should be able to direct you on this and proper disposal   If you need co-pay assistance, please look into the program at healthwellfoundation.org >> disease funds >> hypercholesterolemia. This is an online application or you can call to complete.   If you need a co-pay card for Repatha: BuyingRisk.com.br >> paying for Repatha If you need a co-pay card for Praluent: [email or web address].[email or web address] >> starting & paying for Praluent  *If you need a refill on your cardiac medications before your next appointment, please call your pharmacy*   Lab Work: FASTING lipid panel in 3-4 months  If you have labs (blood work) drawn today and your tests are completely normal, you will receive your results only by: Marland Kitchen MyChart Message (if you have MyChart) OR . A paper copy in the mail If you have any lab test that is abnormal or we need to change your treatment, we will call you to review the results.   Testing/Procedures: NONE   Follow-Up: At [facility], you and your health needs are our priority.  As part of our continuing mission to provide you with exceptional heart care, we have created designated Provider Care Teams.  These Care Teams include your primary Cardiologist (physician) and Advanced Practice Providers (APPs -   Physician Assistants and Nurse Practitioners) who all work together to provide you with the care you need, when you need it.  We recommend signing up for the patient portal called "MyChart".  Sign up information is provided on this After Visit Summary.  MyChart is used to connect with patients for Virtual Visits (Telemedicine).  Patients are able to view lab/test results, encounter notes, upcoming appointments, etc.  Non-urgent messages can be sent to your provider as well.   To learn more about what you can do with MyChart, go to [email or web address].    Your next appointment:   3-4 month(s) - lipid clinic  The format for your next appointment:   Either In Person or Virtual  Provider:   K. [place] Hilty, MD   Other Instructions

## 2019-09-09 NOTE — Progress Notes (Signed)
LIPID CLINIC CONSULT NOTE  Chief Complaint:  Dyslipidemia  Primary Care Physician: Smitty Cords, DO  Primary Cardiologist:  Lorine Bears, MD  HPI:  Jordan Maxwell is a 44 y.o. male who is being seen today for the evaluation of dyslipidemia at the request of Sondra Barges, PA-C. This is a pleasant 44 year old male who grew up in [place], [place] with history of hypertension, dyslipidemia tobacco abuse, high saturated fat diet and obesity who presented with non-ST elevation MI in 2017 and was found to have single-vessel disease of the RCA requiring a stent.  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  Unfortunately is not as physically active as he needs to be.  He has made some reduction in tobacco use cutting down from 2 packs a day to less than half a pack per day.  Blood pressure is now more optimally controlled.  His lipids however remain poorly controlled.  He is unfortunately intolerant to statins having caused significant myalgias.  He tried a number of different low and high potency statins with similar side effects.  His most recent lipid profile showed total cholesterol 05/24/1937, triglycerides 167, HDL 35 and LDL 170.  His target LDL is less than 70.  He had previously been tried on Praluent however after using 2 doses felt that he could not use the injector pen because of anxiety about giving himself the shots.  The primary cardiologist was not able to demonstrate any lipid improvement with this.  I do think a PCSK9 inhibitor is an option and today we discussed the Repatha Pushtronex system as an alternative.  This would not require him doing any self injection rather the Pushtronex device is an auto infuser which would be used once monthly.  He did seem interested in this.  PMHx:  Past Medical History:  Diagnosis Date  . CAD (coronary artery disease)    a. 07/2015 NSTEMI/Cath: 95% stenosed pRCA s/p PCI/DES, o/w  angiographically nl coronary arteries, LVSF 55-65%, no MS/AS; b. 05/2017 ETT (DOT req): Ex time 7:48, no ST/T changes. HTN response.  . H/O medication noncompliance   . Hyperlipidemia   . Hypertension   . LVH (left ventricular hypertrophy)    a. echo 07/30/2015: EF 55-65%, no RWMA, LV diastolic fxn nl, LA nl in size, PASP nl; b. 05/2017 Echo: EF 60-65%, no wrma. Nl RV fxn.  . NSTEMI (non-ST elevated myocardial infarction) (HCC) 07/30/2015  . Tobacco abuse     Past Surgical History:  Procedure Laterality Date  . CARDIAC CATHETERIZATION N/A 07/31/2015   Procedure: Left Heart Cath and Coronary Angiography;  Surgeon: Iran Ouch, MD;  Location: [facility] INVASIVE CV LAB;  Service: Cardiovascular;  Laterality: N/A;  . CARDIAC CATHETERIZATION N/A 07/31/2015   Procedure: Coronary Stent Intervention;  Surgeon: Iran Ouch, MD;  Location: [facility] INVASIVE CV LAB;  Service: Cardiovascular;  Laterality: N/A;    FAMHx:  Family History  Problem Relation Age of Onset  . Hypertension Mother   . Diabetes Mother     SOCHx:   reports that he has been smoking cigarettes. He has a 13.00 pack-year smoking history. His smokeless tobacco use includes chew. He reports current alcohol use. He reports current drug use. Drug: Marijuana.  ALLERGIES:  Allergies  Allergen Reactions  . Eggs Or Egg-Derived Products Anaphylaxis    Incident happened as a child  . Statins     ROS: Pertinent items noted in  HPI and remainder of comprehensive ROS otherwise negative.  HOME MEDS: Current Outpatient Medications on File Prior to Visit  Medication Sig Dispense Refill  . amLODipine (NORVASC) 10 MG tablet Take 1 tablet (10 mg total) by mouth daily. 90 tablet 3  . aspirin 81 MG EC tablet Take 1 tablet (81 mg total) by mouth daily. 30 tablet 3  . carvedilol (COREG) 25 MG tablet Take 1.5 tablets (37.5 mg total) by mouth 2 (two) times daily with a meal. 360 tablet 3  . hydrALAZINE (APRESOLINE) 50 MG tablet Take 1 tablet (50  mg total) by mouth 3 (three) times daily.    Marland Kitchen losartan (COZAAR) 100 MG tablet Take 1 tablet by mouth once daily 90 tablet 1  . nitroGLYCERIN (NITROSTAT) 0.4 MG SL tablet Place 1 tablet (0.4 mg total) under the tongue every 5 (five) minutes as needed for chest pain. 25 tablet 3   No current facility-administered medications on file prior to visit.    LABS/IMAGING: No results found for this or any previous visit (from the past 48 hour(s)). No results found.  LIPID PANEL:    Component Value Date/Time   CHOL 237 (H) 05/20/2019 0956   CHOL 269 (H) 04/09/2018 1050   TRIG 161 (H) 05/20/2019 0956   HDL 35 (L) 05/20/2019 0956   HDL 41 04/09/2018 1050   CHOLHDL 6.8 05/20/2019 0956   VLDL 32 05/20/2019 0956   LDLCALC 170 (H) 05/20/2019 0956   LDLCALC 199 (H) 04/09/2018 1050    WEIGHTS: Wt Readings from Last 3 Encounters:  09/09/19 280 lb (127 kg)  06/15/19 288 lb 12 oz (131 kg)  06/14/19 288 lb (130.6 kg)    VITALS: BP 125/78   Pulse 82   Temp (!) 97.2 F (36.2 C)   Ht 6\' 1"  (1.854 m)   Wt 280 lb (127 kg)   SpO2 97%   BMI 36.94 kg/m   EXAM: Deferred  EKG: Deferred  ASSESSMENT: 1. Mixed dyslipidemia, LDL less than 70 2. Coronary artery disease with NSTEMI (2017), prior PCI/DES to the RCA 3. Hypertension with hypertensive heart disease 4. Tobacco abuse 5. Moderate obesity 6. Statin intolerance - myalgias  PLAN: 1.   Jordan Maxwell has persistent dyslipidemia and has been intolerant to statins.  He had significant myalgias and tried 2 injections of Praluent which caused him discomfort and significant anxiety and ultimately felt that he could not use the medication.  We discussed the alternative today which is the once monthly Repatha Pushtronex system.  I think this would be psychologically better tolerated and actually help him to achieve the LDL reduction that he needs.  Oral options are unlikely to reach target.  We will reach out to get prior authorization for this and  plan to start it as soon as it is authorized.  Repeat lipids in about 3 to 4 months and follow-up at that time.  Thanks again for the kind referral.  Pixie Casino, MD, FACC, [place] Director of the [facility]  [facility] Diplomate of the American Board of Clinical Lipidology Attending Cardiologist  Direct Dial: [phone]  Fax: [phone]  Website:  [email or web address] 09/09/2019, 9:34 AM

## 2019-09-10 ENCOUNTER — Telehealth: Payer: Self-pay | Admitting: Internal Medicine

## 2019-09-10 NOTE — Telephone Encounter (Signed)
PA for repatha pushtronex submitted via CMM  (Key: [id number])  [facility] (preferred drug is Praluent, but patient cannot take d/t need phobia)

## 2019-09-10 NOTE — Telephone Encounter (Signed)
[facility] received a request from your prescriber for coverage of Repatha. As long as you remain covered by the [facility] and there are no changes to your plan benefits, this request is approved for the following time period: 09/10/2019 - 09/09/2020  Patient notified via MyChart

## 2019-09-22 ENCOUNTER — Encounter: Payer: Self-pay | Admitting: Family Medicine

## 2019-09-22 ENCOUNTER — Other Ambulatory Visit: Payer: Self-pay

## 2019-09-22 ENCOUNTER — Other Ambulatory Visit: Payer: Self-pay | Admitting: Family Medicine

## 2019-09-22 ENCOUNTER — Ambulatory Visit (INDEPENDENT_AMBULATORY_CARE_PROVIDER_SITE_OTHER): Payer: BC Managed Care – PPO | Admitting: Family Medicine

## 2019-09-22 VITALS — BP 112/70 | HR 90 | Resp 16 | Ht 73.0 in | Wt 276.2 lb

## 2019-09-22 DIAGNOSIS — R7309 Other abnormal glucose: Secondary | ICD-10-CM

## 2019-09-22 DIAGNOSIS — I25111 Atherosclerotic heart disease of native coronary artery with angina pectoris with documented spasm: Secondary | ICD-10-CM

## 2019-09-22 DIAGNOSIS — E1169 Type 2 diabetes mellitus with other specified complication: Secondary | ICD-10-CM | POA: Insufficient documentation

## 2019-09-22 DIAGNOSIS — E785 Hyperlipidemia, unspecified: Secondary | ICD-10-CM

## 2019-09-22 DIAGNOSIS — I2510 Atherosclerotic heart disease of native coronary artery without angina pectoris: Secondary | ICD-10-CM

## 2019-09-22 DIAGNOSIS — I10 Essential (primary) hypertension: Secondary | ICD-10-CM

## 2019-09-22 DIAGNOSIS — E1159 Type 2 diabetes mellitus with other circulatory complications: Secondary | ICD-10-CM | POA: Insufficient documentation

## 2019-09-22 DIAGNOSIS — Z125 Encounter for screening for malignant neoplasm of prostate: Secondary | ICD-10-CM

## 2019-09-22 DIAGNOSIS — Z0000 Encounter for general adult medical examination without abnormal findings: Secondary | ICD-10-CM

## 2019-09-22 LAB — POCT GLYCOSYLATED HEMOGLOBIN (HGB A1C): Hemoglobin A1C: 6.2 % — AB (ref 4.0–5.6)

## 2019-09-22 NOTE — Patient Instructions (Addendum)
Thank you for coming to the office today.  You can get COVID19 vaccine - Moderna or Pfizer, no egg in the vaccine.  Recent Labs    06/02/19 0916 09/22/19 0817  HGBA1C 7.6* 6.2*   Keep up the great work.  Colon Cancer Screening: - For all adults age 46+ routine colon cancer screening is highly recommended.     - Recent guidelines from American Cancer Society recommend starting age of 45 - Early detection of colon cancer is important, because often there are no warning signs or symptoms, also if found early usually it can be cured. Late stage is hard to treat.  - If you are not interested in Colonoscopy screening (if done and normal you could be cleared for 5 to 10 years until next due), then Cologuard is an excellent alternative for screening test for Colon Cancer. It is highly sensitive for detecting DNA of colon cancer from even the earliest stages. Also, there is NO bowel prep required. - If Cologuard is NEGATIVE, then it is good for 3 years before next due - If Cologuard is POSITIVE, then it is strongly advised to get a Colonoscopy, which allows the GI doctor to locate the source of the cancer or polyp (even very early stage) and treat it by removing it. ------------------------- If you would like to proceed with Cologuard (stool DNA test) - FIRST, call your insurance company and tell them you want to check cost of Cologuard tell them CPT Code 81528 (it may be completely covered and you could get for no cost, OR max cost without any coverage is about $600). Also, keep in mind if you do NOT open the kit, and decide not to do the test, you will NOT be charged, you should contact the company if you decide not to do the test. - If you want to proceed, you can notify us (phone message, MyChart Message, or at next visit) and we will order it for you. The test kit will be delivered to you house within about 1 week. Follow instructions to collect sample, you may call the company for any help or  questions, 24/7 telephone support at [phone].   DUE for FASTING BLOOD WORK (no food or drink after midnight before the lab appointment, only water or coffee without cream/sugar on the morning of)  SCHEDULE "Lab Only" visit in the morning at the clinic for lab draw in 4 MONTHS   - Make sure Lab Only appointment is at about 1 week before your next appointment, so that results will be available  For Lab Results, once available within 2-3 days of blood draw, you can can log in to MyChart online to view your results and a brief explanation. Also, we can discuss results at next follow-up visit.    Please schedule a Follow-up Appointment to: Return in about 4 months (around 01/22/2020) for Annual Physical.  If you have any other questions or concerns, please feel free to call the office or send a message through MyChart. You may also schedule an earlier appointment if necessary.  Additionally, you may be receiving a survey about your experience at our office within a few days to 1 week by e-mail or mail. We value your feedback.  Alexander Karamalegos, DO [facility], [facility] 

## 2019-09-22 NOTE — Progress Notes (Signed)
Subjective:    Patient ID: Jordan Maxwell, male    DOB: 1975/07/31, 44 y.o.   MRN: [id number]  Jordan Maxwell is a 44 y.o. male presenting on 09/22/2019 for Follow-up (3 month ), Pre-Diabetes, and Hypertension   HPI   CHRONIC HTN: Reports history of elevated BP. Has fam history of HTN. Taking meds now with good results but has side effect of ED he believes. Current Meds - Amlodipine 10mg  daily, Coreg 37.5mg  BID (1.5 of the 25mg  tab), Hydralazine 50mg  TID, Losartan 100mg  daily   Reports good compliance, took meds today. Tolerating well, w/o complaints. Denies CP, dyspnea, HA, edema, dizziness / lightheadedness  HYPERLIPIDEMIA: - Last lipid panel 04/2019, elevated LDL 170, not at goal. Previously on Vascepa Has upcoming dose of Repatha, copay approved. Will start this soon  Elevated A1c / Morbid Obesity BMI >36 comorbidities Recent history with significantly elevated A1c at 7.6 back in 05/2019. Attributed to poor diet with large amount of sugar/sweets. Prior A1c normal in 2017. Today due for repeat A1c He has dramatically improved his diet, now cut way back on sweets candy, very rare to eat them, limited desserts, reduced rice and starches. He is doing well. Some weight loss. He has fam history of DM Limited exercise but he plans to keep improving.  CAD, s/p MI (2017) stent placement. Reviewed past history, followed by Cardiology [facility] on med management after Cardiac cath PCI stent place in 2017.  Health Maintenance: Due for Colon CA Screening age 59+ he will check into Cologuard coverage for next visit physical. He prefers non invasive testing. No prior PSA screening now age 70+ no early fam history CA.  Depression screen [facility] 2/9 06/14/2019 11/25/2014  Decreased Interest 0 0  Down, Depressed, Hopeless 0 0  PHQ - 2 Score 0 0    Social History   Tobacco Use  . Smoking status: Current Every Day Smoker    Packs/day: 0.50    Years: 26.00    Pack years: 13.00    Types: Cigarettes    . Smokeless tobacco: Current User    Types: Chew  . Tobacco comment: Pack every two days  Substance Use Topics  . Alcohol use: Yes    Alcohol/week: 0.0 standard drinks    Comment: ocassionally  . Drug use: Yes    Types: Marijuana    Comment: past    Review of Systems Per HPI unless specifically indicated above     Objective:    BP 112/70 (BP Location: Left Arm, Patient Position: Sitting, Cuff Size: Normal)   Pulse 90   Resp 16   Ht 6\' 1"  (1.854 m)   Wt 276 lb 3.2 oz (125.3 kg)   SpO2 99%   BMI 36.44 kg/m   Wt Readings from Last 3 Encounters:  09/22/19 276 lb 3.2 oz (125.3 kg)  09/09/19 280 lb (127 kg)  06/15/19 288 lb 12 oz (131 kg)    Physical Exam Vitals and nursing note reviewed.  Constitutional:      General: He is not in acute distress.    Appearance: He is well-developed. He is obese. He is not diaphoretic.     Comments: Well-appearing, comfortable, cooperative  HENT:     Head: Normocephalic and atraumatic.  Eyes:     General:        Right eye: No discharge.        Left eye: No discharge.     Conjunctiva/sclera: Conjunctivae normal.  Cardiovascular:     Rate and Rhythm:  Normal rate.  Pulmonary:     Effort: Pulmonary effort is normal.  Skin:    General: Skin is warm and dry.     Findings: No erythema or rash.  Neurological:     Mental Status: He is alert and oriented to person, place, and time.  Psychiatric:        Behavior: Behavior normal.     Comments: Well groomed, good eye contact, normal speech and thoughts      Recent Labs    06/02/19 0916 09/22/19 0817  HGBA1C 7.6* 6.2*     Results for orders placed or performed in visit on 09/22/19  POCT glycosylated hemoglobin (Hb A1C)  Result Value Ref Range   Hemoglobin A1C 6.2 (A) 4.0 - 5.6 %      Assessment & Plan:   Problem List Items Addressed This Visit    Morbid obesity ([place])    Weight loss overall now on improved diet Considered morbid obesity based on BMI >36 but with  Hyperlipidemia, Hypertension, Elevated A1c, History of heart disease      Hypertension goal BP (blood pressure) < 140/90    Controlled HTN Followed by [facility] Cardiology Continue current meds - Amlodipine, Coreg, Hydralazine, Losartan      Hyperlipidemia LDL goal <70    Previously Uncontrolled cholesterol 04/2019, poor lifestyle at that time  Plan: 1. Continue current meds - newly started Repatha. Has coupon discount. 2. Continue ASA 81mg  for primary ASCVD risk reduction 3. Encourage improved lifestyle - low carb/cholesterol, reduce portion size, continue improving regular exercise  F/u w Cardiology for future lipid testing.      Elevated hemoglobin A1c - Primary    Significant improvement A1c down to 6.2, previously up to 7.6 on last lab. Now remains in Pre-Diabetic range. Major improvement after lifestyle diet changes Concern with obesity, HTN, HLD, CAD, fam history DM  Plan:  1. Not on any therapy currently  2. Encourage improved lifestyle - low carb, low sugar diet, reduce portion size, continue improving regular exercise 3. Follow-up 4 months with upcoming labs for Annual       Relevant Orders   POCT glycosylated hemoglobin (Hb A1C) (Completed)   CAD (coronary artery disease)    Followed by [facility] Cardiology Asymptomatic On med management Now on repatha         No orders of the defined types were placed in this encounter.     Follow up plan: Return in about 4 months (around 01/22/2020) for Annual Physical.  Future labs ordered for 01/20/20 - no lipid panel since cardiology will be re-checking in Fall 2021. Will check other labs and add PSA now age 38+   Nobie Putnam, [facility] 09/22/2019, 8:16 AM

## 2019-09-22 NOTE — Assessment & Plan Note (Signed)
Weight loss overall now on improved diet Considered morbid obesity based on BMI >36 but with Hyperlipidemia, Hypertension, Elevated A1c, History of heart disease

## 2019-09-22 NOTE — Assessment & Plan Note (Signed)
Controlled HTN Followed by [facility] Cardiology Continue current meds - Amlodipine, Coreg, Hydralazine, Losartan 

## 2019-09-22 NOTE — Assessment & Plan Note (Signed)
Significant improvement A1c down to 6.2, previously up to 7.6 on last lab. Now remains in Pre-Diabetic range. Major improvement after lifestyle diet changes Concern with obesity, HTN, HLD, CAD, fam history DM  Plan:  1. Not on any therapy currently  2. Encourage improved lifestyle - low carb, low sugar diet, reduce portion size, continue improving regular exercise 3. Follow-up 4 months with upcoming labs for Annual

## 2019-09-22 NOTE — Assessment & Plan Note (Signed)
Previously Uncontrolled cholesterol 04/2019, poor lifestyle at that time  Plan: 1. Continue current meds - newly started Repatha. Has coupon discount. 2. Continue ASA 81mg  for primary ASCVD risk reduction 3. Encourage improved lifestyle - low carb/cholesterol, reduce portion size, continue improving regular exercise  F/u w Cardiology for future lipid testing.

## 2019-09-22 NOTE — Assessment & Plan Note (Signed)
Followed by [facility] Cardiology Asymptomatic On med management Now on repatha

## 2019-11-03 ENCOUNTER — Other Ambulatory Visit: Payer: Self-pay | Admitting: Physician Assistant

## 2019-11-03 MED ORDER — HYDRALAZINE HCL 50 MG PO TABS: 50.0000 mg | ORAL_TABLET | Freq: Three times a day (TID) | ORAL | 0 refills | Status: DC

## 2019-11-03 NOTE — Telephone Encounter (Signed)
Requested Prescriptions   Signed Prescriptions Disp Refills   hydrALAZINE (APRESOLINE) 50 MG tablet 270 tablet 0    Sig: Take 1 tablet (50 mg total) by mouth 3 (three) times daily.    Authorizing Provider: Sondra Barges    Ordering User: Thayer Headings, Chandra Asher L

## 2019-11-03 NOTE — Telephone Encounter (Signed)
*  STAT* If patient is at the pharmacy, call can be transferred to refill team.   1. Which medications need to be refilled? (please list name of each medication and dose if known)   Hydralazine 50 mg po TID   2. Which pharmacy/location (including street and city if local pharmacy) is medication to be sent to?  [facility] [address]  3. Do they need a 30 day or 90 day supply? 90

## 2019-11-18 ENCOUNTER — Other Ambulatory Visit: Payer: Self-pay

## 2019-11-18 ENCOUNTER — Encounter: Payer: Self-pay | Admitting: Cardiovascular Disease

## 2019-11-18 ENCOUNTER — Ambulatory Visit (INDEPENDENT_AMBULATORY_CARE_PROVIDER_SITE_OTHER): Payer: BC Managed Care – PPO | Admitting: Cardiovascular Disease

## 2019-11-18 VITALS — BP 124/80 | HR 79 | Ht 72.0 in | Wt 282.1 lb

## 2019-11-18 DIAGNOSIS — I2510 Atherosclerotic heart disease of native coronary artery without angina pectoris: Secondary | ICD-10-CM

## 2019-11-18 DIAGNOSIS — I10 Essential (primary) hypertension: Secondary | ICD-10-CM

## 2019-11-18 DIAGNOSIS — E785 Hyperlipidemia, unspecified: Secondary | ICD-10-CM

## 2019-11-18 DIAGNOSIS — Z720 Tobacco use: Secondary | ICD-10-CM | POA: Diagnosis not present

## 2019-11-18 MED ORDER — AMLODIPINE BESYLATE 10 MG PO TABS: 10.0000 mg | ORAL_TABLET | Freq: Every day | ORAL | 2 refills | Status: DC

## 2019-11-18 MED ORDER — LOSARTAN POTASSIUM 100 MG PO TABS: 100.0000 mg | ORAL_TABLET | Freq: Every day | ORAL | 2 refills | Status: DC

## 2019-11-18 MED ORDER — HYDRALAZINE HCL 50 MG PO TABS: 50.0000 mg | ORAL_TABLET | Freq: Three times a day (TID) | ORAL | 2 refills | Status: DC

## 2019-11-18 MED ORDER — CARVEDILOL 25 MG PO TABS: 37.5000 mg | ORAL_TABLET | Freq: Two times a day (BID) | ORAL | 2 refills | Status: DC

## 2019-11-18 NOTE — Progress Notes (Signed)
Cardiology Office Note   Date:  11/18/2019   ID:  Jordan Maxwell, DOB 06-Jun-1975, MRN [id number]  PCP:  Smitty Cords, DO  Cardiologist:   Lorine Bears, MD   Chief Complaint  Patient presents with  . other    Follow up ETT. Meds reviewed by the pt. verbally. "doing well."       History of Present Illness: Jordan Maxwell is a 44 y.o. male who presents for a follow-up visit regarding coronary artery disease. He was hospitalized in April 2017 with non-ST elevation myocardial infarction. Echocardiogram showed normal LV systolic function. Cardiac catheterization showed severe one-vessel coronary artery disease involving the right coronary artery. He underwent successful angioplasty and drug-eluting stent placement. He has other chronic medical conditions that include hypertension, hyperlipidemia, tobacco use and obesity. He has history of intolerance to statins and Zetia.  He was seen by Dr. Rennis Golden and the patient is currently on Repatha monthly injections.  He has done that for the last 2 months with no issues.  He is doing reasonably well with no chest pain or shortness of breath.  Unfortunately, he continues to smoke.   Past Medical History:  Diagnosis Date  . CAD (coronary artery disease)    a. 07/2015 NSTEMI/Cath: 95% stenosed pRCA s/p PCI/DES, o/w angiographically nl coronary arteries, LVSF 55-65%, no MS/AS; b. 05/2017 ETT (DOT req): Ex time 7:48, no ST/T changes. HTN response.  . H/O medication noncompliance   . Hyperlipidemia   . Hypertension   . LVH (left ventricular hypertrophy)    a. echo 07/30/2015: EF 55-65%, no RWMA, LV diastolic fxn nl, LA nl in size, PASP nl; b. 05/2017 Echo: EF 60-65%, no wrma. Nl RV fxn.  . NSTEMI (non-ST elevated myocardial infarction) (HCC) 07/30/2015  . Tobacco abuse     Past Surgical History:  Procedure Laterality Date  . CARDIAC CATHETERIZATION N/A 07/31/2015   Procedure: Left Heart Cath and Coronary Angiography;  Surgeon: Iran Ouch, MD;  Location: [facility] INVASIVE CV LAB;  Service: Cardiovascular;  Laterality: N/A;  . CARDIAC CATHETERIZATION N/A 07/31/2015   Procedure: Coronary Stent Intervention;  Surgeon: Iran Ouch, MD;  Location: [facility] INVASIVE CV LAB;  Service: Cardiovascular;  Laterality: N/A;     Current Outpatient Medications  Medication Sig Dispense Refill  . amLODipine (NORVASC) 10 MG tablet Take 1 tablet (10 mg total) by mouth daily. 90 tablet 3  . aspirin 81 MG EC tablet Take 1 tablet (81 mg total) by mouth daily. 30 tablet 3  . carvedilol (COREG) 25 MG tablet Take 1.5 tablets (37.5 mg total) by mouth 2 (two) times daily with a meal. 360 tablet 3  . Evolocumab with Infusor (REPATHA PUSHTRONEX SYSTEM) 420 MG/3.5ML SOCT Inject 1 Dose into the skin every 30 (thirty) days. 3.6 mL 11  . hydrALAZINE (APRESOLINE) 50 MG tablet Take 1 tablet (50 mg total) by mouth 3 (three) times daily. 270 tablet 0  . losartan (COZAAR) 100 MG tablet Take 1 tablet by mouth once daily 90 tablet 1  . nitroGLYCERIN (NITROSTAT) 0.4 MG SL tablet Place 1 tablet (0.4 mg total) under the tongue every 5 (five) minutes as needed for chest pain. 25 tablet 3   No current facility-administered medications for this visit.    Allergies:   Eggs or egg-derived products and Statins    Social History:  The patient  reports that he has been smoking cigarettes. He has a 13.00 pack-year smoking history. His smokeless tobacco use includes chew. He reports  current alcohol use. He reports current drug use. Drug: Marijuana.   Family History:  The patient's family history includes Diabetes in his mother; Hypertension in his mother.    ROS:  Please see the history of present illness.   Otherwise, review of systems are positive for none.   All other systems are reviewed and negative.    PHYSICAL EXAM: VS:  BP 124/80 (BP Location: Left Arm, Patient Position: Sitting, Cuff Size: Large)   Pulse 79   Ht 6' (1.829 m)   Wt (!) 282 lb 2 oz (128 kg)    SpO2 98%   BMI 38.26 kg/m  , BMI Body mass index is 38.26 kg/m. GEN: Well nourished, well developed, in no acute distress  HEENT: normal  Neck: no JVD, carotid bruits, or masses Cardiac: RRR; no murmurs, rubs, or gallops,no edema  Respiratory:  clear to auscultation bilaterally, normal work of breathing GI: soft, nontender, nondistended, + BS MS: no deformity or atrophy  Skin: warm and dry, no rash Neuro:  Strength and sensation are intact Psych: euthymic mood, full affect   EKG:  EKG is ordered today. EKG showed normal sinus rhythm with nonspecific ST and T wave changes.  Early repolarization in the anterior leads   Recent Labs: 05/20/2019: ALT 26; BUN 12; Creatinine, Ser 0.94; Potassium 4.5; Sodium 138    Lipid Panel    Component Value Date/Time   CHOL 237 (H) 05/20/2019 0956   CHOL 269 (H) 04/09/2018 1050   TRIG 161 (H) 05/20/2019 0956   HDL 35 (L) 05/20/2019 0956   HDL 41 04/09/2018 1050   CHOLHDL 6.8 05/20/2019 0956   VLDL 32 05/20/2019 0956   LDLCALC 170 (H) 05/20/2019 0956   LDLCALC 199 (H) 04/09/2018 1050      Wt Readings from Last 3 Encounters:  11/18/19 (!) 282 lb 2 oz (128 kg)  09/22/19 276 lb 3.2 oz (125.3 kg)  09/09/19 280 lb (127 kg)       ASSESSMENT AND PLAN:  1.  Coronary artery disease involving native coronary arteries without angina: He is overall doing very well. Continue medical therapy.  Continue aspirin indefinitely.  2. Essential hypertension: Blood pressures well controlled on current medications.  3. Hyperlipidemia: Currently on Repatha due to intolerance to statins and Zetia.  He is supposed to get his physical in October and will wait until then to see his labs including lipid profile.  4. Tobacco use: I again discussed with him the importance of smoking cessation.   5.  The patient had questions about COVID-19 vaccine and I answered his questions.   Disposition:   FU with me in 6 months  Signed,  Lorine Bears, MD    11/18/2019 3:53 PM    [facility]

## 2019-11-18 NOTE — Patient Instructions (Signed)
Medication Instructions:  Your physician recommends that you continue on your current medications as directed. Please refer to the Current Medication list given to you today.  You cardiac medications have been refilled today.  *If you need a refill on your cardiac medications before your next appointment, please call your pharmacy*   Lab Work: None ordered If you have labs (blood work) drawn today and your tests are completely normal, you will receive your results only by: Marland Kitchen MyChart Message (if you have MyChart) OR . A paper copy in the mail If you have any lab test that is abnormal or we need to change your treatment, we will call you to review the results.   Testing/Procedures: None ordered   Follow-Up: At [facility], you and your health needs are our priority.  As part of our continuing mission to provide you with exceptional heart care, we have created designated Provider Care Teams.  These Care Teams include your primary Cardiologist (physician) and Advanced Practice Providers (APPs -  Physician Assistants and Nurse Practitioners) who all work together to provide you with the care you need, when you need it.  We recommend signing up for the patient portal called "MyChart".  Sign up information is provided on this After Visit Summary.  MyChart is used to connect with patients for Virtual Visits (Telemedicine).  Patients are able to view lab/test results, encounter notes, upcoming appointments, etc.  Non-urgent messages can be sent to your provider as well.   To learn more about what you can do with MyChart, go to [email or web address].    Your next appointment:   6 month(s)  The format for your next appointment:   In Person  Provider:    You may see Lorine Bears, MD or one of the following Advanced Practice Providers on your designated Care Team:    Nicolasa Ducking, NP  Eula Listen, PA-C  Marisue Ivan, PA-C    Other Instructions N/A

## 2019-11-19 ENCOUNTER — Other Ambulatory Visit: Payer: Self-pay | Admitting: Physician Assistant

## 2019-12-09 ENCOUNTER — Ambulatory Visit: Payer: BC Managed Care – PPO | Admitting: Internal Medicine

## 2020-01-19 ENCOUNTER — Other Ambulatory Visit: Payer: Self-pay | Admitting: *Deleted

## 2020-01-19 DIAGNOSIS — Z125 Encounter for screening for malignant neoplasm of prostate: Secondary | ICD-10-CM

## 2020-01-19 DIAGNOSIS — I10 Essential (primary) hypertension: Secondary | ICD-10-CM

## 2020-01-19 DIAGNOSIS — E785 Hyperlipidemia, unspecified: Secondary | ICD-10-CM

## 2020-01-19 DIAGNOSIS — R7309 Other abnormal glucose: Secondary | ICD-10-CM

## 2020-01-19 DIAGNOSIS — Z0000 Encounter for general adult medical examination without abnormal findings: Secondary | ICD-10-CM

## 2020-01-20 ENCOUNTER — Other Ambulatory Visit: Payer: Self-pay

## 2020-01-20 ENCOUNTER — Other Ambulatory Visit: Payer: BC Managed Care – PPO

## 2020-01-21 LAB — COMPLETE METABOLIC PANEL WITH GFR
AG Ratio: 1.8 (calc) (ref 1.0–2.5)
ALT: 21 U/L (ref 9–46)
AST: 18 U/L (ref 10–40)
Albumin: 4.2 g/dL (ref 3.6–5.1)
Alkaline phosphatase (APISO): 50 U/L (ref 36–130)
BUN: 14 mg/dL (ref 7–25)
CO2: 25 mmol/L (ref 20–32)
Calcium: 9.6 mg/dL (ref 8.6–10.3)
Chloride: 105 mmol/L (ref 98–110)
Creat: 1.02 mg/dL (ref 0.60–1.35)
GFR, Est African American: 102 mL/min/{1.73_m2} (ref >=60)
GFR, Est Non African American: 88 mL/min/{1.73_m2} (ref >=60)
Globulin: 2.4 g/dL (calc) (ref 1.9–3.7)
Glucose, Bld: 123 mg/dL — ABNORMAL HIGH (ref 65–99)
Potassium: 4.5 mmol/L (ref 3.5–5.3)
Sodium: 138 mmol/L (ref 135–146)
Total Bilirubin: 0.4 mg/dL (ref 0.2–1.2)
Total Protein: 6.6 g/dL (ref 6.1–8.1)

## 2020-01-21 LAB — CBC WITH DIFFERENTIAL/PLATELET
Absolute Monocytes: 455 cells/uL (ref 200–950)
Basophils Absolute: 41 cells/uL (ref 0–200)
Basophils Relative: 0.9 %
Eosinophils Absolute: 140 cells/uL (ref 15–500)
Eosinophils Relative: 3.1 %
HCT: 48.4 % (ref 38.5–50.0)
Hemoglobin: 16.2 g/dL (ref 13.2–17.1)
Lymphs Abs: 1337 cells/uL (ref 850–3900)
MCH: 31.6 pg (ref 27.0–33.0)
MCHC: 33.5 g/dL (ref 32.0–36.0)
MCV: 94.3 fL (ref 80.0–100.0)
MPV: 10.1 fL (ref 7.5–12.5)
Monocytes Relative: 10.1 %
Neutro Abs: 2529 cells/uL (ref 1500–7800)
Neutrophils Relative %: 56.2 %
Platelets: 314 10*3/uL (ref 140–400)
RBC: 5.13 10*6/uL (ref 4.20–5.80)
RDW: 12.7 % (ref 11.0–15.0)
Total Lymphocyte: 29.7 %
WBC: 4.5 10*3/uL (ref 3.8–10.8)

## 2020-01-21 LAB — HEMOGLOBIN A1C
Hgb A1c MFr Bld: 6.9 % of total Hgb — ABNORMAL HIGH (ref <5.7)
Mean Plasma Glucose: 151 (calc)
eAG (mmol/L): 8.4 (calc)

## 2020-01-21 LAB — PSA: PSA: 0.74 ng/mL (ref <=4.0)

## 2020-01-21 LAB — TSH: TSH: 0.64 mIU/L (ref 0.40–4.50)

## 2020-01-24 ENCOUNTER — Encounter: Payer: Self-pay | Admitting: Family Medicine

## 2020-01-27 ENCOUNTER — Encounter: Payer: Self-pay | Admitting: Family Medicine

## 2020-01-27 ENCOUNTER — Other Ambulatory Visit: Payer: Self-pay

## 2020-01-27 ENCOUNTER — Ambulatory Visit (INDEPENDENT_AMBULATORY_CARE_PROVIDER_SITE_OTHER): Payer: BC Managed Care – PPO | Admitting: Family Medicine

## 2020-01-27 VITALS — BP 115/75 | HR 82 | Temp 97.5°F | Resp 16 | Ht 72.0 in | Wt 282.0 lb

## 2020-01-27 DIAGNOSIS — Z0000 Encounter for general adult medical examination without abnormal findings: Secondary | ICD-10-CM

## 2020-01-27 DIAGNOSIS — I10 Essential (primary) hypertension: Secondary | ICD-10-CM | POA: Diagnosis not present

## 2020-01-27 DIAGNOSIS — E1169 Type 2 diabetes mellitus with other specified complication: Secondary | ICD-10-CM

## 2020-01-27 DIAGNOSIS — E785 Hyperlipidemia, unspecified: Secondary | ICD-10-CM

## 2020-01-27 MED ORDER — METFORMIN HCL 500 MG PO TABS: 500.0000 mg | ORAL_TABLET | Freq: Two times a day (BID) | ORAL | 1 refills | Status: DC

## 2020-01-27 NOTE — Progress Notes (Signed)
Subjective:    Patient ID: Jordan Maxwell, male    DOB: May 15, 1975, 44 y.o.   MRN: [id number]  Jordan Maxwell is a 44 y.o. male presenting on 01/27/2020 for Annual Exam   HPI   Here for Annual Physical and Lab Review  CHRONIC HTN: Reportshistory of elevated BP. Has fam history of HTN. Current Meds -Amlodipine 10mg  daily, Coreg 37.5mg  BID (1.5 of the 25mg  tab), Hydralazine 50mg  TID, Losartan 100mg  daily Reports good compliance, took meds today. Tolerating well, w/o complaints. Denies CP, dyspnea, HA, edema, dizziness / lightheadedness  HYPERLIPIDEMIA: -Last lipid 04/2019 ,elevated LDL 170, not at goal. He missed last lipid from Cardiology lipid clinic Dr He is on Repatha  Type 2 Diabetes / Morbid Obesity BMI >36 comorbidities Recent history with significantly elevated A1c at 7.6 back in 05/2019. Attributed to poor diet with large amount of sugar/sweets. Prior A1c normal in 2017. Recent labs confirm type 2 diabetes, A1c up to 6.9 now Previous diet improved now he has resumed prior habits and has poor diet admits fast food, limited meal regularity, often long hours driving for work Admits high starches / sugars / carbs He has fam history of DM Limited exercise but he plans to keep improving.  CAD, s/p MI (2017) stent placement. Reviewed past history, followed by Cardiology [facility] on med management after Cardiac cath PCI stent place in 2017.  Health Maintenance: Due for Colon CA Screening age 56+ he will check into Cologuard coverage in future. He declines colonoscopy No prior PSA screening now age 36+ no early fam history CA. - PSA last lab shows normal range 0.74, negative.    Depression screen [facility] 2/9 01/27/2020 06/14/2019 11/25/2014  Decreased Interest 0 0 0  Down, Depressed, Hopeless 0 0 0  PHQ - 2 Score 0 0 0    Past Medical History:  Diagnosis Date  . CAD (coronary artery disease)    a. 07/2015 NSTEMI/Cath: 95% stenosed pRCA s/p PCI/DES, o/w angiographically  nl coronary arteries, LVSF 55-65%, no MS/AS; b. 05/2017 ETT (DOT req): Ex time 7:48, no ST/T changes. HTN response.  . H/O medication noncompliance   . Hyperlipidemia   . Hypertension   . LVH (left ventricular hypertrophy)    a. echo 07/30/2015: EF 55-65%, no RWMA, LV diastolic fxn nl, LA nl in size, PASP nl; b. 05/2017 Echo: EF 60-65%, no wrma. Nl RV fxn.  . NSTEMI (non-ST elevated myocardial infarction) (HCC) 07/30/2015  . Tobacco abuse    Past Surgical History:  Procedure Laterality Date  . CARDIAC CATHETERIZATION N/A 07/31/2015   Procedure: Left Heart Cath and Coronary Angiography;  Surgeon: 09/29/2015, MD;  Location: [facility] INVASIVE CV LAB;  Service: Cardiovascular;  Laterality: N/A;  . CARDIAC CATHETERIZATION N/A 07/31/2015   Procedure: Coronary Stent Intervention;  Surgeon: 09/29/2015, MD;  Location: [facility] INVASIVE CV LAB;  Service: Cardiovascular;  Laterality: N/A;   Social History   Socioeconomic History  . Marital status: Single    Spouse name: Not on file  . Number of children: 4  . Years of education: Not on file  . Highest education level: Not on file  Occupational History  . 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 Smoking status: Current Every Day Smoker    Packs/day: 0.50    Years: 26.00    Pack years: 13.00    Types: Cigarettes  . Smokeless tobacco: Current User    Types: Chew  . Tobacco comment:  Pack every two days  Vaping Use  . Vaping Use: Never used  Substance and Sexual Activity  . Alcohol use: Yes    Alcohol/week: 0.0 standard drinks    Comment: ocassionally  . Drug use: Yes    Types: Marijuana    Comment: past  . Sexual activity: Yes    Partners: Female    Birth control/protection: None  Other Topics Concern  . Not on file  Social History Narrative  . Not on file   Social Determinants of Health   Financial Resource Strain:   . Difficulty of Paying Living Expenses: Not on file  Food Insecurity:   . Worried  About Programme researcher, broadcasting/film/video in the Last Year: Not on file  . Ran Out of Food in the Last Year: Not on file  Transportation Needs:   . Lack of Transportation (Medical): Not on file  . Lack of Transportation (Non-Medical): Not on file  Physical Activity:   . Days of Exercise per Week: Not on file  . Minutes of Exercise per Session: Not on file  Stress:   . Feeling of Stress : Not on file  Social Connections:   . Frequency of Communication with Friends and Family: Not on file  . Frequency of Social Gatherings with Friends and Family: Not on file  . Attends Religious Services: Not on file  . Active Member of Clubs or Organizations: Not on file  . Attends Banker Meetings: Not on file  . Marital Status: Not on file  Intimate Partner Violence:   . Fear of Current or Ex-Partner: Not on file  . Emotionally Abused: Not on file  . Physically Abused: Not on file  . Sexually Abused: Not on file   Family History  Problem Relation Age of Onset  . Hypertension Mother   . Diabetes Mother    Current Outpatient Medications on File Prior to Visit  Medication Sig  . amLODipine (NORVASC) 10 MG tablet Take 1 tablet (10 mg total) by mouth daily.  Marland Kitchen aspirin 81 MG EC tablet Take 1 tablet (81 mg total) by mouth daily.  . carvedilol (COREG) 25 MG tablet Take 1.5 tablets (37.5 mg total) by mouth 2 (two) times daily with a meal.  . Evolocumab with Infusor (REPATHA PUSHTRONEX SYSTEM) 420 MG/3.5ML SOCT Inject 1 Dose into the skin every 30 (thirty) days.  . hydrALAZINE (APRESOLINE) 50 MG tablet Take 1 tablet (50 mg total) by mouth 3 (three) times daily.  Marland Kitchen losartan (COZAAR) 100 MG tablet Take 1 tablet by mouth once daily  . nitroGLYCERIN (NITROSTAT) 0.4 MG SL tablet Place 1 tablet (0.4 mg total) under the tongue every 5 (five) minutes as needed for chest pain.   No current facility-administered medications on file prior to visit.    Review of Systems  Constitutional: Negative for activity  change, appetite change, chills, diaphoresis, fatigue and fever.  HENT: Negative for congestion and hearing loss.   Eyes: Negative for visual disturbance.  Respiratory: Negative for apnea, cough, chest tightness, shortness of breath and wheezing.   Cardiovascular: Negative for chest pain, palpitations and leg swelling.  Gastrointestinal: Negative for abdominal pain, anal bleeding, blood in stool, constipation, diarrhea, nausea and vomiting.  Endocrine: Negative for cold intolerance.  Genitourinary: Negative for difficulty urinating, dysuria, frequency and hematuria.  Musculoskeletal: Negative for arthralgias, back pain and neck pain.  Skin: Negative for rash.  Allergic/Immunologic: Negative for environmental allergies.  Neurological: Negative for dizziness, weakness, light-headedness, numbness and headaches.  Hematological:  Negative for adenopathy.  Psychiatric/Behavioral: Negative for behavioral problems, dysphoric mood and sleep disturbance. The patient is not nervous/anxious.    Per HPI unless specifically indicated above     Objective:    BP 115/75   Pulse 82   Temp (!) 97.5 F (36.4 C) (Temporal)   Resp 16   Ht 6' (1.829 m)   Wt 282 lb (127.9 kg)   SpO2 100%   BMI 38.25 kg/m   Wt Readings from Last 3 Encounters:  01/27/20 282 lb (127.9 kg)  11/18/19 (!) 282 lb 2 oz (128 kg)  09/22/19 276 lb 3.2 oz (125.3 kg)    Physical Exam Vitals and nursing note reviewed.  Constitutional:      General: He is not in acute distress.    Appearance: He is well-developed. He is not diaphoretic.     Comments: Well-appearing, comfortable, cooperative  HENT:     Head: Normocephalic and atraumatic.  Eyes:     General:        Right eye: No discharge.        Left eye: No discharge.     Conjunctiva/sclera: Conjunctivae normal.     Pupils: Pupils are equal, round, and reactive to light.  Neck:     Thyroid: No thyromegaly.  Cardiovascular:     Rate and Rhythm: Normal rate and regular  rhythm.     Heart sounds: Normal heart sounds. No murmur heard.   Pulmonary:     Effort: Pulmonary effort is normal. No respiratory distress.     Breath sounds: Normal breath sounds. No wheezing or rales.  Abdominal:     General: Bowel sounds are normal. There is no distension.     Palpations: Abdomen is soft. There is no mass.     Tenderness: There is no abdominal tenderness.  Musculoskeletal:        General: No tenderness. Normal range of motion.     Cervical back: Normal range of motion and neck supple.     Comments: Upper / Lower Extremities: - Normal muscle tone, strength bilateral upper extremities 5/5, lower extremities 5/5  Lymphadenopathy:     Cervical: No cervical adenopathy.  Skin:    General: Skin is warm and dry.     Findings: No erythema or rash.  Neurological:     Mental Status: He is alert and oriented to person, place, and time.     Comments: Distal sensation intact to light touch all extremities  Psychiatric:        Behavior: Behavior normal.     Comments: Well groomed, good eye contact, normal speech and thoughts      Diabetic Foot Exam - Simple   Simple Foot Form Diabetic Foot exam was performed with the following findings: Yes 01/27/2020  8:34 AM  Visual Inspection No deformities, no ulcerations, no other skin breakdown bilaterally: Yes Sensation Testing Intact to touch and monofilament testing bilaterally: Yes Pulse Check Posterior Tibialis and Dorsalis pulse intact bilaterally: Yes Comments     Recent Labs    06/02/19 0916 09/22/19 0817 01/20/20 0755  HGBA1C 7.6* 6.2* 6.9*      Results for orders placed or performed in visit on 01/19/20  TSH  Result Value Ref Range   TSH 0.64 0.40 - 4.50 mIU/L  PSA  Result Value Ref Range   PSA 0.74 < OR = 4.0 ng/mL  COMPLETE METABOLIC PANEL WITH GFR  Result Value Ref Range   Glucose, Bld 123 (H) 65 - 99 mg/dL  BUN 14 7 - 25 mg/dL   Creat 0.25 4.27 - 0.62 mg/dL   GFR, Est Non African American 88 >  OR = 60 mL/min/1.46m2   GFR, Est African American 102 > OR = 60 mL/min/1.31m2   BUN/Creatinine Ratio NOT APPLICABLE 6 - 22 (calc)   Sodium 138 135 - 146 mmol/L   Potassium 4.5 3.5 - 5.3 mmol/L   Chloride 105 98 - 110 mmol/L   CO2 25 20 - 32 mmol/L   Calcium 9.6 8.6 - 10.3 mg/dL   Total Protein 6.6 6.1 - 8.1 g/dL   Albumin 4.2 3.6 - 5.1 g/dL   Globulin 2.4 1.9 - 3.7 g/dL (calc)   AG Ratio 1.8 1.0 - 2.5 (calc)   Total Bilirubin 0.4 0.2 - 1.2 mg/dL   Alkaline phosphatase (APISO) 50 36 - 130 U/L   AST 18 10 - 40 U/L   ALT 21 9 - 46 U/L  CBC with Differential/Platelet  Result Value Ref Range   WBC 4.5 3.8 - 10.8 Thousand/uL   RBC 5.13 4.20 - 5.80 Million/uL   Hemoglobin 16.2 13.2 - 17.1 g/dL   HCT 37.6 38 - 50 %   MCV 94.3 80.0 - 100.0 fL   MCH 31.6 27.0 - 33.0 pg   MCHC 33.5 32.0 - 36.0 g/dL   RDW 28.3 15.1 - 76.1 %   Platelets 314 140 - 400 Thousand/uL   MPV 10.1 7.5 - 12.5 fL   Neutro Abs 2,529 1,500 - 7,800 cells/uL   Lymphs Abs 1,337 850 - 3,900 cells/uL   Absolute Monocytes 455 200 - 950 cells/uL   Eosinophils Absolute 140 15 - 500 cells/uL   Basophils Absolute 41 0 - 200 cells/uL   Neutrophils Relative % 56.2 %   Total Lymphocyte 29.7 %   Monocytes Relative 10.1 %   Eosinophils Relative 3.1 %   Basophils Relative 0.9 %  Hemoglobin A1c  Result Value Ref Range   Hgb A1c MFr Bld 6.9 (H) <5.7 % of total Hgb   Mean Plasma Glucose 151 (calc)   eAG (mmol/L) 8.4 (calc)      Assessment & Plan:   Problem List Items Addressed This Visit    Type 2 diabetes mellitus with other specified complication (HCC)    Dramatic worsening A1c now up to 6.9, diagnosed Type 2 Diabetes Major improvement after lifestyle diet changes Concern with obesity, HTN, HLD, CAD, fam history DM  Plan:  1. START Metformin 500mg  IR daily with meal, titrate up to 1000mg  daily or 500 BID as tolerated, he has sporadic eating may be best with XR or one dose daily, may adjust in future - Offer GLP1 therapy  in future when ready to pursue, would benefit him for wt / CAD 2. Encourage improved lifestyle - low carb, low sugar diet, reduce portion size, continue improving regular exercise - He declines refer to Nutrition/Lifestyle DM education 3. DM Foot exam today 4. Request record [facility] for DM eye - or repeat it 5. On Repatha, [facility] Follow-up 3 mo      Relevant Medications   metFORMIN (GLUCOPHAGE) 500 MG tablet   Morbid obesity (HCC)    Weight gain in 3-4 months 10+ lbs Considered morbid obesity based on BMI >38 but with Hyperlipidemia, Hypertension, Diabetes, CAD      Relevant Medications   metFORMIN (GLUCOPHAGE) 500 MG tablet   Hypertension goal BP (blood pressure) < 140/90    Controlled HTN Followed by [facility] Continue current meds - Amlodipine,  Coreg, Hydralazine, Losartan      Hyperlipidemia associated with type 2 diabetes mellitus (HCC)    Due for repeat lipids on repatha PCSK91 per Cardiology On [facility] Await f/u with Cards and lipid panel      Relevant Medications   metFORMIN (GLUCOPHAGE) 500 MG tablet    Other Visit Diagnoses    Annual physical exam    -  Primary      Updated Health Maintenance information - declines Flu vaccine - UTD COVID19 vaccine, he will bring card in to copy Reviewed recent lab results with patient Encouraged improvement to lifestyle with diet and exercise - Goal of weight loss   Meds ordered this encounter  Medications  . metFORMIN (GLUCOPHAGE) 500 MG tablet    Sig: Take 1 tablet (500 mg total) by mouth 2 (two) times daily with a meal. If preferred may switch to 2 pills with one meal once a day.    Dispense:  180 tablet    Refill:  1      Follow up plan: Return in about 3 months (around 04/28/2020) for 3 month DM A1c / ALSO needs 40 min procedure visit Skin Tag removal (anytime).  Saralyn Pilar, DO [facility] 01/27/2020, 8:15 AM

## 2020-01-27 NOTE — Assessment & Plan Note (Signed)
Due for repeat lipids on repatha PCSK91 per Cardiology On [facility] Await f/u with Cards and lipid panel

## 2020-01-27 NOTE — Assessment & Plan Note (Addendum)
Dramatic worsening A1c now up to 6.9, diagnosed Type 2 Diabetes Major improvement after lifestyle diet changes Concern with obesity, HTN, HLD, CAD, fam history DM  Plan:  1. START Metformin 500mg  IR daily with meal, titrate up to 1000mg  daily or 500 BID as tolerated, he has sporadic eating may be best with XR or one dose daily, may adjust in future - Offer GLP1 therapy in future when ready to pursue, would benefit him for wt / CAD 2. Encourage improved lifestyle - low carb, low sugar diet, reduce portion size, continue improving regular exercise - He declines refer to Nutrition/Lifestyle DM education 3. DM Foot exam today 4. Request record [facility] for DM eye - or repeat it 5. On Repatha, [facility] Follow-up 3 mo

## 2020-01-27 NOTE — Assessment & Plan Note (Signed)
Weight gain in 3-4 months 10+ lbs Considered morbid obesity based on BMI >38 but with Hyperlipidemia, Hypertension, Diabetes, CAD

## 2020-01-27 NOTE — Assessment & Plan Note (Signed)
Controlled HTN Followed by [facility] Continue current meds - Amlodipine, Coreg, Hydralazine, Losartan

## 2020-01-27 NOTE — Patient Instructions (Addendum)
Thank you for coming to the office today.  Start Metformin 500mg  daily with meal, may cause some initial stomach upset, eventually gradual increase to 2 pills a day, either with same meal or twice a day.  If needed we can consider newer diabetic medications that may help  Request a photocopy of your COVID19 card.  We will get copy of last eye exam, next time you go to them, tell them you are diagnosed with Diabetes.  [facility]  Address: [address] [place], [place], [place] Phone: [phone]   -----------------------------  Call Dr [phone] Cardiology, check with them for the cholesterol blood test on the Repatha.  Please schedule a Follow-up Appointment to: Return in about 3 months (around 04/28/2020) for 3 month DM A1c / ALSO needs 40 min procedure visit Skin Tag removal (anytime).  If you have any other questions or concerns, please feel free to call the office or send a message through MyChart. You may also schedule an earlier appointment if necessary.  Additionally, you may be receiving a survey about your experience at our office within a few days to 1 week by e-mail or mail. We value your feedback.  06/26/2020, DO [facility], [facility]

## 2020-02-10 ENCOUNTER — Ambulatory Visit (INDEPENDENT_AMBULATORY_CARE_PROVIDER_SITE_OTHER): Payer: BC Managed Care – PPO | Admitting: Family Medicine

## 2020-02-10 ENCOUNTER — Encounter: Payer: Self-pay | Admitting: Family Medicine

## 2020-02-10 ENCOUNTER — Other Ambulatory Visit: Payer: Self-pay

## 2020-02-10 VITALS — BP 131/76 | HR 79 | Temp 96.9°F | Resp 16 | Ht 72.0 in | Wt 283.6 lb

## 2020-02-10 DIAGNOSIS — L918 Other hypertrophic disorders of the skin: Secondary | ICD-10-CM

## 2020-02-10 NOTE — Patient Instructions (Addendum)
Thank you for coming to the office today.  Skin tag removed. We will discard and not send for pathology as the appearance looks to be benign or normal and very low risk for potential cancer.  After removed, we use a chemical (Silver Nitrate) to slightly burn the top of the skin to avoid bleeding. It may look darker or black and this will come off with time as the skin heals.  You will have a surface skin sore or scab that may develop. This will heal in time. It may leave a small scar.  Wound Care Instructions - Today: Leave it for 24 hours and avoid submerging under water. You may shower but avoid scrubbing the area today. - After 24 hours you can cleanse it gently and use soap and water, and then use topical antibiotic ointment (Neosporin or other) or may use Vaseline - to help protect the skin sore and keep it covered, may take 2-4 weeks for the wound to heal.  If not improving you may need to return for re-evaluation. But if more severe worsening such as spreading redness or streaking redness, significantly larger size, persistent drainage of pus, increased pain, fevers/chills, nausea vomiting. If significantly worse symptoms or most of these symptoms, would recommend going straight to Hospital Emergency Dept otherwise if more mild or new problem and catching it early you may notify our office first and we can consider oral antibiotics.   Please schedule a Follow-up Appointment to: Return if symptoms worsen or fail to improve.  If you have any other questions or concerns, please feel free to call the office or send a message through MyChart. You may also schedule an earlier appointment if necessary.  Additionally, you may be receiving a survey about your experience at our office within a few days to 1 week by e-mail or mail. We value your feedback.  Olyvia Gopal, DO [facility], [facility] 

## 2020-02-10 NOTE — Progress Notes (Signed)
Subjective:    Patient ID: Jordan Maxwell, male    DOB: January 29, 1976, 44 y.o.   MRN: [id number]  Jordan Maxwell is a 44 y.o. male presenting on 02/10/2020 for Skin Tag (back side)   HPI   Multiple skin tags, acquired, Left flank and Back Reports has skin tags that would like to be removed. 2 locations Left flank and low back, one larger one present for long time bothering him. Has been scratched before. Not tender or painful Not inflamed No redness or ulceration Denies any fever chills   Depression screen [facility] 2/9 01/27/2020 06/14/2019 11/25/2014  Decreased Interest 0 0 0  Down, Depressed, Hopeless 0 0 0  PHQ - 2 Score 0 0 0    Social History   Tobacco Use  . Smoking status: Current Every Day Smoker    Packs/day: 0.50    Years: 26.00    Pack years: 13.00    Types: Cigarettes  . Smokeless tobacco: Current User    Types: Chew  . Tobacco comment: Pack every two days  Vaping Use  . Vaping Use: Never used  Substance Use Topics  . Alcohol use: Yes    Alcohol/week: 0.0 standard drinks    Comment: ocassionally  . Drug use: Yes    Types: Marijuana    Comment: past    Review of Systems Per HPI unless specifically indicated above     Objective:    BP 131/76   Pulse 79   Temp (!) 96.9 F (36.1 C) (Temporal)   Resp 16   Ht 6' (1.829 m)   Wt 283 lb 9.6 oz (128.6 kg)   SpO2 100%   BMI 38.46 kg/m   Wt Readings from Last 3 Encounters:  02/10/20 283 lb 9.6 oz (128.6 kg)  01/27/20 282 lb (127.9 kg)  11/18/19 (!) 282 lb 2 oz (128 kg)    Physical Exam Vitals and nursing note reviewed.  Constitutional:      General: He is not in acute distress.    Appearance: He is well-developed. He is not diaphoretic.     Comments: Well-appearing, comfortable, cooperative  HENT:     Head: Normocephalic and atraumatic.  Eyes:     General:        Right eye: No discharge.        Left eye: No discharge.     Conjunctiva/sclera: Conjunctivae normal.  Cardiovascular:     Rate and  Rhythm: Normal rate.  Pulmonary:     Effort: Pulmonary effort is normal.  Skin:    General: Skin is warm and dry.     Findings: Lesion (skin tags x 2 one larger left low back and one smaller left upper flank near axillary) present. No erythema or rash.  Neurological:     Mental Status: He is alert and oriented to person, place, and time.  Psychiatric:        Behavior: Behavior normal.     Comments: Well groomed, good eye contact, normal speech and thoughts     ________________________________________________________ PROCEDURE NOTE Date: 02/10/20 Inflamed Skin Tag Removal Discussed benefits and risks (including pain, bleeding, infection) Verbal consent given by patient. Medication:  1 cc per skin tag Lidocaine 1% without epi Time Out taken Skin was prepped using alcohol wipes. A total of 2 skin tags (Left low back and Left flank location) were anesthetized using 1% Lidocaine without epi, injected at the base of each lesion. Skin was then cleansed using Betadaine swab x 1 per skin tag,  and then wiped with alcohol. Using sterile pickups and 15 blade scalpel the lesions were clipped at the base. Patient tolerated well. Hemostasis was obtained with silver nitrate. No complications. Band-aid applied. Lesions were not sent for pathology as low clinical suspicion for malignancy.     Assessment & Plan:   Problem List Items Addressed This Visit    None    Visit Diagnoses    Skin tags, multiple acquired    -  Primary      X 2 non inflamed skin tags removed See procedure note Tolerated well After care instructions given, advised leave ointment on 24 hours, then may shower, soap and water, routine wound care, use antibiotic ointment vs vaseline BID for 1-2 weeks until healed   No orders of the defined types were placed in this encounter.     Follow up plan: Return if symptoms worsen or fail to improve.   Saralyn Pilar, DO [facility] 02/10/2020, 9:35 AM

## 2020-03-28 ENCOUNTER — Other Ambulatory Visit: Payer: Self-pay | Admitting: Physician Assistant

## 2020-05-01 ENCOUNTER — Encounter: Payer: Self-pay | Admitting: Family Medicine

## 2020-05-01 ENCOUNTER — Ambulatory Visit (INDEPENDENT_AMBULATORY_CARE_PROVIDER_SITE_OTHER): Payer: BC Managed Care – PPO | Admitting: Family Medicine

## 2020-05-01 ENCOUNTER — Other Ambulatory Visit: Payer: Self-pay

## 2020-05-01 VITALS — BP 126/83 | HR 85 | Temp 98.0°F | Resp 16 | Ht 72.0 in | Wt 286.6 lb

## 2020-05-01 DIAGNOSIS — E1169 Type 2 diabetes mellitus with other specified complication: Secondary | ICD-10-CM

## 2020-05-01 DIAGNOSIS — I2510 Atherosclerotic heart disease of native coronary artery without angina pectoris: Secondary | ICD-10-CM

## 2020-05-01 LAB — POCT GLYCOSYLATED HEMOGLOBIN (HGB A1C): Hemoglobin A1C: 6.3 % — AB (ref 4.0–5.6)

## 2020-05-01 MED ORDER — RYBELSUS 3 MG PO TABS: 3.0000 mg | ORAL_TABLET | Freq: Every day | ORAL | 0 refills | Status: DC

## 2020-05-01 NOTE — Patient Instructions (Addendum)
Thank you for coming to the office today.  Reminder to get a copy of the Eye Doctor results.  Start Rybelsus 3mg  daily, 30 min before food/drink other meds. Empty stomach. For 30 days only, we will need to re order the higher dose 7mg  once you get close to completing 30 days. Let me know - message or call.  STOP Metformin, start the new medicine.   Please schedule a Follow-up Appointment to: Return in about 3 months (around 07/30/2020) for 3 month follow-up DM A1c (on Rybelsus), ALSO first available DOT physical w/ .  If you have any other questions or concerns, please feel free to call the office or send a message through MyChart. You may also schedule an earlier appointment if necessary.  Additionally, you may be receiving a survey about your experience at our office within a few days to 1 week by e-mail or mail. We value your feedback.  09/29/2020, DO [facility], Saralyn Pilar

## 2020-05-01 NOTE — Progress Notes (Signed)
Subjective:    Patient ID: Jordan Maxwell, male    DOB: 03-01-1976, 45 y.o.   MRN: [id number]  Jordan Maxwell is a 45 y.o. male presenting on 05/01/2020 for Diabetes   HPI   Type 2 Diabetes / Morbid Obesity BMI >38 comorbidities Recent history with significantly elevated A1c at 7.6 back in 05/2019. Attributed to poor diet with large amount of sugar/sweets. Prior A1c normal in 2017. Recent diagnosis T2DM 01/2020, last A1c 6.9 Due for A1c today Still admits some poor diet history - diet admits fast food, limited meal regularity, often long hours driving for work. Admits high starches / sugars / carbs He was started on Metformin last visit 01/2020, he says it makes him gain weight and cause diarrhea GI side effects He has fam history of DM Limited exercise but he plans to keep improving. He had recent DM Eye - thinks at [facility] but will need to get copy of report He is interested in weight loss and other DM medicine as discussed   Depression screen [facility] 2/9 05/01/2020 01/27/2020 06/14/2019  Decreased Interest 0 0 0  Down, Depressed, Hopeless 0 0 0  PHQ - 2 Score 0 0 0    Social History   Tobacco Use  . Smoking status: Current Every Day Smoker    Packs/day: 0.50    Years: 26.00    Pack years: 13.00    Types: Cigarettes  . Smokeless tobacco: Current User    Types: Chew  . Tobacco comment: Pack every two days  Vaping Use  . Vaping Use: Never used  Substance Use Topics  . Alcohol use: Yes    Alcohol/week: 0.0 standard drinks    Comment: ocassionally  . Drug use: Yes    Types: Marijuana    Comment: past    Review of Systems Per HPI unless specifically indicated above     Objective:    BP 126/83   Pulse 85   Temp 98 F (36.7 C) (Temporal)   Resp 16   Ht 6' (1.829 m)   Wt 286 lb 9.6 oz (130 kg)   SpO2 98%   BMI 38.87 kg/m   Wt Readings from Last 3 Encounters:  05/01/20 286 lb 9.6 oz (130 kg)  02/10/20 283 lb 9.6 oz (128.6 kg)  01/27/20 282 lb (127.9 kg)     Physical Exam Vitals and nursing note reviewed.  Constitutional:      General: He is not in acute distress.    Appearance: He is well-developed and well-nourished. He is obese. He is not diaphoretic.     Comments: Well-appearing, comfortable, cooperative  HENT:     Head: Normocephalic and atraumatic.     Mouth/Throat:     Mouth: Oropharynx is clear and moist.  Eyes:     General:        Right eye: No discharge.        Left eye: No discharge.     Conjunctiva/sclera: Conjunctivae normal.  Cardiovascular:     Rate and Rhythm: Normal rate.  Pulmonary:     Effort: Pulmonary effort is normal.  Musculoskeletal:        General: No edema.  Skin:    General: Skin is warm and dry.     Findings: No erythema or rash.  Neurological:     Mental Status: He is alert and oriented to person, place, and time.  Psychiatric:        Mood and Affect: Mood and affect normal.  Behavior: Behavior normal.     Comments: Well groomed, good eye contact, normal speech and thoughts      Recent Labs    09/22/19 0817 01/20/20 0755 05/01/20 0832  HGBA1C 6.2* 6.9* 6.3*     Results for orders placed or performed in visit on 05/01/20  POCT HgB A1C  Result Value Ref Range   Hemoglobin A1C 6.3 (A) 4.0 - 5.6 %      Assessment & Plan:   Problem List Items Addressed This Visit    Type 2 diabetes mellitus with other specified complication (HCC) - Primary    Notable improvement A1c to 6.3 now T2DM within range Concern with obesity, HTN, HLD, CAD, fam history DM  Plan:  1. STOP Metformin due to GI intolerance, ineffective result. 2. START Rybelsus GLP1 oral 3mg  daily (30 min before 1st meal, drink, meds) - 3mg  for 30 day course, signed up for copay coupon, sent to ins for PA, will dose inc to 7mg  after 30 days  Encourage improved lifestyle - low carb, low sugar diet, reduce portion size, continue improving regular exercise He can Request record [facility] for DM eye - or repeat it On Repatha,  ARB Follow-up 3 mo      Relevant Medications   RYBELSUS 3 MG TABS   Other Relevant Orders   POCT HgB A1C (Completed)   Morbid obesity (HCC)   Relevant Medications   RYBELSUS 3 MG TABS   CAD (coronary artery disease)     See above DM A&P, patient with co morbid conditions morbid obesity and CAD, would benefit from GLP1 therapy.  __________________________________________ Additional Rx Information (May be used for Prior Authorization if required)  Medication name and Strength: Rybelsus 3mg   1. Primary Diagnosis and ICD10 Code: Type 2 Diabetes with other specified comoplication (E11.69) 2. Secondary Diagnosis and ICD10 Code: Coronary Artery Disease (I25.10), Morbid Obesity (E66.01) 3. Previous Failed Medications (Duration or Start Date) a. Metformin (01/2020 - 04/2020) - ineffective. 4. Quantity and Duration of New Medication: 30 pills for 30 days 5. Additional Supporting Information: a. Lab result A1c 6.3 ___________________________________________   Meds ordered this encounter  Medications  . RYBELSUS 3 MG TABS    Sig: Take 3 mg by mouth daily before breakfast. About 30 min before other food/drink, medicines.    Dispense:  30 tablet    Refill:  0      Follow up plan: Return in about 3 months (around 07/30/2020) for 3 month follow-up DM A1c (on Rybelsus), ALSO first available DOT physical w/ .   02/2020, DO [facility] 05/01/2020, 8:19 AM

## 2020-05-01 NOTE — Assessment & Plan Note (Addendum)
Notable improvement A1c to 6.3 now T2DM within range Concern with obesity, HTN, HLD, CAD, fam history DM  Plan:  1. STOP Metformin due to GI intolerance, ineffective result. 2. START Rybelsus GLP1 oral 3mg  daily (30 min before 1st meal, drink, meds) - 3mg  for 30 day course, signed up for copay coupon, sent to ins for PA, will dose inc to 7mg  after 30 days  Encourage improved lifestyle - low carb, low sugar diet, reduce portion size, continue improving regular exercise He can Request record [facility] for DM eye - or repeat it On Repatha, ARB Follow-up 3 mo

## 2020-05-05 ENCOUNTER — Other Ambulatory Visit: Payer: Self-pay

## 2020-05-05 ENCOUNTER — Ambulatory Visit (INDEPENDENT_AMBULATORY_CARE_PROVIDER_SITE_OTHER): Payer: Self-pay | Admitting: Family Medicine

## 2020-05-05 ENCOUNTER — Encounter: Payer: Self-pay | Admitting: Family Medicine

## 2020-05-05 VITALS — BP 124/77 | HR 88 | Ht 72.0 in | Wt 287.0 lb

## 2020-05-05 DIAGNOSIS — Z024 Encounter for examination for driving license: Secondary | ICD-10-CM | POA: Insufficient documentation

## 2020-05-05 NOTE — Progress Notes (Signed)
Subjective:    Patient ID: Jordan Maxwell, male    DOB: May 27, 1975, 45 y.o.   MRN: [id number]  Jordan Maxwell is a 45 y.o. male presenting on 05/05/2020 for Employment Physical (DOT Physical)   HPI  Ms. Garr presents to clinic for DOT PE.  Depression screen [facility] 2/9 05/01/2020 01/27/2020 06/14/2019  Decreased Interest 0 0 0  Down, Depressed, Hopeless 0 0 0  PHQ - 2 Score 0 0 0    Social History   Tobacco Use  . Smoking status: Current Every Day Smoker    Packs/day: 0.50    Years: 26.00    Pack years: 13.00    Types: Cigarettes  . Smokeless tobacco: Current User    Types: Chew  . Tobacco comment: Pack every two days  Vaping Use  . Vaping Use: Never used  Substance Use Topics  . Alcohol use: Yes    Alcohol/week: 0.0 standard drinks    Comment: ocassionally  . Drug use: Yes    Types: Marijuana    Comment: past    Review of Systems  Constitutional: Negative.   HENT: Negative.   Eyes: Negative.   Respiratory: Negative.   Cardiovascular: Negative.   Gastrointestinal: Negative.   Endocrine: Negative.   Genitourinary: Negative.   Musculoskeletal: Negative.   Skin: Negative.   Allergic/Immunologic: Negative.   Neurological: Negative.   Hematological: Negative.   Psychiatric/Behavioral: Negative.    Per HPI unless specifically indicated above     Objective:    BP 124/77 (BP Location: Right Arm, Patient Position: Sitting, Cuff Size: Large)   Pulse 88   Ht 6' (1.829 m)   Wt 287 lb (130.2 kg)   BMI 38.92 kg/m   Wt Readings from Last 3 Encounters:  05/05/20 287 lb (130.2 kg)  05/01/20 286 lb 9.6 oz (130 kg)  02/10/20 283 lb 9.6 oz (128.6 kg)    Physical Exam Vitals reviewed.  Constitutional:      General: He is not in acute distress.    Appearance: Normal appearance. He is well-developed and well-groomed. He is obese. He is not ill-appearing or toxic-appearing.  HENT:     Head: Normocephalic.     Right Ear: Tympanic membrane, ear canal and external ear  normal. There is no impacted cerumen.     Left Ear: Tympanic membrane, ear canal and external ear normal. There is no impacted cerumen.     Nose: Nose normal. No congestion or rhinorrhea.     Mouth/Throat:     Mouth: Mucous membranes are moist.     Pharynx: Oropharynx is clear. No oropharyngeal exudate or posterior oropharyngeal erythema.  Eyes:     General: Lids are normal. Vision grossly intact. No scleral icterus.       Right eye: No discharge.        Left eye: No discharge.     Extraocular Movements: Extraocular movements intact.     Conjunctiva/sclera: Conjunctivae normal.     Pupils: Pupils are equal, round, and reactive to light.  Cardiovascular:     Rate and Rhythm: Normal rate and regular rhythm.     Pulses: Normal pulses.          Dorsalis pedis pulses are 2+ on the right side and 2+ on the left side.     Heart sounds: Normal heart sounds. No murmur heard. No friction rub. No gallop.   Pulmonary:     Effort: Pulmonary effort is normal. No respiratory distress.     Breath sounds: Normal  breath sounds. No wheezing, rhonchi or rales.  Abdominal:     General: Abdomen is flat. Bowel sounds are normal. There is no distension.     Palpations: Abdomen is soft. There is no hepatomegaly, splenomegaly or mass.     Tenderness: There is no abdominal tenderness. There is no right CVA tenderness, left CVA tenderness, guarding or rebound.     Hernia: No hernia is present.  Musculoskeletal:        General: Normal range of motion.     Cervical back: Normal range of motion and neck supple. No rigidity or tenderness.     Right lower leg: No edema.     Left lower leg: No edema.     Comments: Normal tone, 5/5 strength BUE & BLE  Feet:     Right foot:     Skin integrity: Skin integrity normal.     Left foot:     Skin integrity: Skin integrity normal.  Lymphadenopathy:     Cervical: No cervical adenopathy.  Skin:    General: Skin is warm and dry.     Capillary Refill: Capillary refill  takes less than 2 seconds.  Neurological:     General: No focal deficit present.     Mental Status: He is alert and oriented to person, place, and time.     Cranial Nerves: Cranial nerves are intact. No cranial nerve deficit.     Sensory: Sensation is intact. No sensory deficit.     Motor: Motor function is intact. No weakness.     Coordination: Coordination is intact. Coordination normal.     Gait: Gait is intact. Gait normal.     Deep Tendon Reflexes: Reflexes are normal and symmetric. Reflexes normal.  Psychiatric:        Attention and Perception: Attention and perception normal.        Mood and Affect: Mood and affect normal.        Speech: Speech normal.        Behavior: Behavior normal. Behavior is cooperative.        Thought Content: Thought content normal.        Cognition and Memory: Cognition and memory normal.        Judgment: Judgment normal.    Results for orders placed or performed in visit on 05/01/20  POCT HgB A1C  Result Value Ref Range   Hemoglobin A1C 6.3 (A) 4.0 - 5.6 %      Assessment & Plan:   Problem List Items Addressed This Visit      Other   Encounter for Department of Transportation (DOT) examination for trucking license - Primary    DOT Certificate provided x 1 year  Hearing test: Pass at 15' Vision: 20/15 R, 20/13 L, 20/13 Both Uncorrected Urine 1.010, Neg Protein, Neg Glucose, Neg Hematuria  A1C 6.3%         No orders of the defined types were placed in this encounter.  Follow up plan: No follow-ups on file.   Charlaine Dalton, FNP Family Nurse Practitioner [facility] 05/05/2020, 9:01 AM

## 2020-05-05 NOTE — Assessment & Plan Note (Signed)
DOT Certificate provided x 1 year  Hearing test: Pass at 15' Vision: 20/15 R, 20/13 L, 20/13 Both Uncorrected Urine 1.010, Neg Protein, Neg Glucose, Neg Hematuria  A1C 6.3%

## 2020-05-31 ENCOUNTER — Other Ambulatory Visit: Payer: Self-pay | Admitting: Family Medicine

## 2020-05-31 DIAGNOSIS — E1169 Type 2 diabetes mellitus with other specified complication: Secondary | ICD-10-CM

## 2020-05-31 NOTE — Telephone Encounter (Signed)
Requested medication (s) are due for refill today: yes  Requested medication (s) are on the active medication list: yes  Last refill:  05/01/2020  Future visit scheduled: no  Notes to clinic:  Medication not assigned to a protocol, review manually   Requested Prescriptions  Pending Prescriptions Disp Refills   RYBELSUS 3 MG TABS [Pharmacy Med Name: Rybelsus 3 MG Oral Tablet] 30 tablet 0    Sig: TAKE 1 TABLET BY MOUTH ONCE DAILY BEFORE BREAKFAST. ABOUT 30 MINUTES BEFORE OTHER FOOD/DRINK, MEDICINES      Off-Protocol Failed - 05/31/2020 12:19 PM      Failed - Medication not assigned to a protocol, review manually.      Passed - Valid encounter within last 12 months    Recent Outpatient Visits           3 weeks ago Encounter for Department of Transportation (DOT) examination for trucking license   [facility], Jodelle Gross, FNP   1 month ago Type 2 diabetes mellitus with other specified complication, without long-term current use of insulin [facility])   [facility], Netta Neat, DO   3 months ago Skin tags, multiple acquired   [facility], Netta Neat, DO   4 months ago Annual physical exam   [facility] Smitty Cords, DO   8 months ago Elevated hemoglobin A1c   [facility], Netta Neat, [place]

## 2020-08-10 ENCOUNTER — Telehealth: Payer: Self-pay | Admitting: Cardiovascular Disease

## 2020-08-10 NOTE — Telephone Encounter (Signed)
Patient has been contacted at least 3 times for a recall, recall has been removed 

## 2020-08-14 ENCOUNTER — Other Ambulatory Visit: Payer: Self-pay | Admitting: Cardiovascular Disease

## 2020-08-14 NOTE — Telephone Encounter (Signed)
Please contact pt for future appointment. Pt overdue for 6 month f/u. Pt needing refills. 

## 2020-08-14 NOTE — Telephone Encounter (Signed)
Attempted to schedule no ans no vm  

## 2020-08-16 NOTE — Telephone Encounter (Signed)
Attempted to schedule no ans no vm  

## 2020-08-18 NOTE — Telephone Encounter (Signed)
Attempted to schedule.  LMOV to call office.  ° °

## 2020-08-23 NOTE — Telephone Encounter (Signed)
Attempted to schedule.  Unable to reach .  Closing encounter.

## 2020-09-14 ENCOUNTER — Other Ambulatory Visit: Payer: Self-pay

## 2020-09-15 MED ORDER — LOSARTAN POTASSIUM 100 MG PO TABS: 1.0000 | ORAL_TABLET | Freq: Every day | ORAL | 0 refills | Status: DC

## 2020-09-25 NOTE — Progress Notes (Signed)
Office Visit    Patient Name: Jordan Maxwell Date of Encounter: 09/26/2020  Primary Care Provider:  Smitty Cords, DO Primary Cardiologist:  Lorine Bears, MD  Chief Complaint    45 year old male with a history of CAD status post non-STEMI and drug-eluting stent placement to the RCA in April 2017, hypertension, hyperlipidemia, statin intolerance, and obesity, who presents for follow-up of CAD.  Past Medical History    Past Medical History:  Diagnosis Date  . CAD (coronary artery disease)    a. 07/2015 NSTEMI/Cath: 95% stenosed pRCA s/p PCI/DES, otw angiographically nl cors, LVEF 55-65%, no MS/AS; b. 05/2017 ETT (DOT req): Ex time 7:48, no ST/T changes. HTN response; c. 05/2019 ETT (DOT): Ex time 6:00, no ST/T changes. HTN response.  . H/O medication noncompliance   . Hyperlipidemia   . Hypertension   . LVH (left ventricular hypertrophy)    a. echo 07/30/2015: EF 55-65%, no RWMA, LV diastolic fxn nl, LA nl in size, PASP nl; b. 05/2017 Echo: EF 60-65%, no wrma. Nl RV fxn.  . NSTEMI (non-ST elevated myocardial infarction) (HCC) 07/30/2015  . Statin intolerance   . Tobacco abuse    Past Surgical History:  Procedure Laterality Date  . CARDIAC CATHETERIZATION N/A 07/31/2015   Procedure: Left Heart Cath and Coronary Angiography;  Surgeon: Iran Ouch, MD;  Location: [facility] INVASIVE CV LAB;  Service: Cardiovascular;  Laterality: N/A;  . CARDIAC CATHETERIZATION N/A 07/31/2015   Procedure: Coronary Stent Intervention;  Surgeon: Iran Ouch, MD;  Location: [facility] INVASIVE CV LAB;  Service: Cardiovascular;  Laterality: N/A;    Allergies  Allergies  Allergen Reactions  . Eggs Or Egg-Derived Products Anaphylaxis    Incident happened as a child  . Statins     History of Present Illness    45 year old male with above past medical history including coronary artery disease, hypertension, hyperlipidemia, obesity, LHV, statin intolerance, and tobacco abuse.  In April 2017, he  suffered a non-STEMI with finding of a 95% stenosed proximal RCA and otherwise nonobstructive disease.  The RCA was successfully treated with a drug-eluting stent.  Since then, he has undergone exercise treadmill testing to satisfy DOT requirements in February 2019, and again in February 2021, , each time without ST or T changes, and each time with hypertensive response to exercise.  He was last seen in cardiology clinic in July 2021, at which time he was doing well.    Since his last visit, he reports continuing to do well.  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  His diet largely consists of fast food.  He generally has 12-hour days and works 60 hours/week.  He continues to smoke 1 pack of cigarettes per day.  He does not routinely exercise but denies chest pain or dyspnea.  He is able to push mow his lawn without any symptoms or limitations.  He reports compliance with his medications though is about to run out of several.  He has only been taking his hydralazine once a day as otherwise, he would have run out in March.  He stopped taking Repatha because he felt like it was causing his sugars to rise.  He is not interested in retrying.  He denies chest pain, palpitations, PND, orthopnea, dizziness, syncope, edema, or early satiety.  Home Medications    Prior to Admission medications   Medication Sig Start Date End Date Taking? Authorizing Provider  amLODipine (NORVASC) 10 MG tablet Take 1 tablet (10 mg total)  by mouth daily. 11/18/19 02/16/20  Iran Ouch, MD  aspirin 81 MG EC tablet Take 1 tablet (81 mg total) by mouth daily. 09/11/15   Iran Ouch, MD  carvedilol (COREG) 25 MG tablet Take 1.5 tablets (37.5 mg total) by mouth 2 (two) times daily with a meal. 11/18/19   Iran Ouch, MD  Evolocumab with Infusor (REPATHA PUSHTRONEX SYSTEM) 420 MG/3.5ML SOCT Inject 1 Dose into the skin every 30 (thirty) days. 09/09/19   Hilty, Lisette Abu, MD  hydrALAZINE (APRESOLINE) 50 MG  tablet Take 1 tablet (50 mg total) by mouth 3 (three) times daily. PLEASE CALL OFFICE TO SCHEDULE APPOINTMENT FOR FURTHER REFILLS. 03/28/20   Iran Ouch, MD  losartan (COZAAR) 100 MG tablet Take 1 tablet (100 mg total) by mouth daily. Please call to schedule office visit for further refills. Thank you! 09/15/20   Iran Ouch, MD  nitroGLYCERIN (NITROSTAT) 0.4 MG SL tablet Place 1 tablet (0.4 mg total) under the tongue every 5 (five) minutes as needed for chest pain. 05/28/17   Dunn, Ryan M, PA-C  RYBELSUS 7 MG TABS Take 7 mg by mouth daily before breakfast. About 30 min before other food/drink, medicines. 05/31/20   Smitty Cords, DO    Review of Systems    He denies chest pain, palpitations, dyspnea, pnd, orthopnea, n, v, dizziness, syncope, edema, weight gain, or early satiety.  All other systems reviewed and are otherwise negative except as noted above.  Physical Exam    VS:  BP (!) 126/100 (BP Location: Left Arm, Patient Position: Sitting, Cuff Size: Large)   Pulse 91   Ht 6' (1.829 m)   Wt 277 lb (125.6 kg)   SpO2 97%   BMI 37.57 kg/m  , BMI Body mass index is 37.57 kg/m. GEN: Well nourished, well developed, in no acute distress. HEENT: normal. Neck: Supple, no JVD, carotid bruits, or masses. Cardiac: RRR, no murmurs, rubs, or gallops. No clubbing, cyanosis, edema.  Radials/PT 2+ and equal bilaterally.  Respiratory:  Respirations regular and unlabored, clear to auscultation bilaterally. GI: Obese, soft, nontender, nondistended, BS + x 4. MS: no deformity or atrophy. Skin: warm and dry, no rash. Neuro:  Strength and sensation are intact. Psych: Normal affect.  Accessory Clinical Findings    ECG personally reviewed by me today -regular sinus rhythm, 91 - no acute changes.  Lab Results  Component Value Date   WBC 4.5 01/20/2020   HGB 16.2 01/20/2020   HCT 48.4 01/20/2020   MCV 94.3 01/20/2020   PLT 314 01/20/2020   Lab Results  Component Value Date    CREATININE 1.02 01/20/2020   BUN 14 01/20/2020   NA 138 01/20/2020   K 4.5 01/20/2020   CL 105 01/20/2020   CO2 25 01/20/2020   Lab Results  Component Value Date   ALT 21 01/20/2020   AST 18 01/20/2020   ALKPHOS 52 05/20/2019   BILITOT 0.4 01/20/2020   Lab Results  Component Value Date   CHOL 237 (H) 05/20/2019   HDL 35 (L) 05/20/2019   LDLCALC 170 (H) 05/20/2019   TRIG 161 (H) 05/20/2019   CHOLHDL 6.8 05/20/2019    Lab Results  Component Value Date   HGBA1C 6.3 (A) 05/01/2020    Assessment & Plan    1.  Coronary artery disease: Status post prior inferior STEMI in April 2017, with drug-eluting stenting to the proximal right coronary artery.  He otherwise had nonobstructive disease.  Most recent exercise  treadmill test was performed in February 2021 to satisfy DOT requirements, and this was without ST or T changes.  He did have a hypertensive response to exercise at that time.  Since his last visit nearly 1 year ago, he has been stable without chest pain or dyspnea.  He is able to push mow his yard without symptoms or limitations.  He does not otherwise exercise and continues to smoke 1 pack of cigarettes per day.  We discussed the importance of lifestyle occasions and the role of weight loss in the management of heart disease.  He remains on aspirin, beta-blocker, and ARB therapy.  He is statin and Zetia intolerant and is no longer taking Repatha as he feels that it was driving up his blood sugars.  He is due for follow-up lipids and LFTs.  He would be willing to consider trying bempedoic acid but will wait to see what lipids look like.  2.  Essential hypertension: Diastolic pressure elevated today (126/100).  He reports compliance with medications though we note that some of his prescriptions have expired since his last visit.  We will refill his amlodipine, carvedilol, hydralazine, and losartan.  He notes that he is been taking hydralazine once daily and was advised to take it as  prescribed.  3.  Hyperlipidemia with statin intolerance: As noted, previously intolerant to Zetia and statins.  Previously on Praluent and then most recently Repatha but discontinued secondary to concerns related to blood sugars.  He does not want to retrial.  Follow-up lipids and LFTs today and will consider adding bempedoic acid.  4.  Tobacco abuse: Still smoking 1 pack/day.  Complete cessation advised.  5.  Morbid obesity: Patient's weight is down slightly but he remains overweight.  We discussed his diet and exercise habits today, which are generally poor.  6.  Type 2 diabetes mellitus: A1c of 6.3 in January on semaglutide.  7.  Disposition: Follow-up complete metabolic panel and lipids today.  Follow-up in clinic in 6 months or sooner if necessary.  Nicolasa Ducking, NP 09/26/2020, 8:42 AM

## 2020-09-26 ENCOUNTER — Ambulatory Visit (INDEPENDENT_AMBULATORY_CARE_PROVIDER_SITE_OTHER): Payer: BC Managed Care – PPO | Admitting: Nurse Practitioner

## 2020-09-26 ENCOUNTER — Encounter: Payer: Self-pay | Admitting: Nurse Practitioner

## 2020-09-26 ENCOUNTER — Other Ambulatory Visit: Payer: Self-pay

## 2020-09-26 VITALS — BP 126/100 | HR 91 | Ht 72.0 in | Wt 277.0 lb

## 2020-09-26 DIAGNOSIS — I2510 Atherosclerotic heart disease of native coronary artery without angina pectoris: Secondary | ICD-10-CM

## 2020-09-26 DIAGNOSIS — E785 Hyperlipidemia, unspecified: Secondary | ICD-10-CM | POA: Diagnosis not present

## 2020-09-26 DIAGNOSIS — Z720 Tobacco use: Secondary | ICD-10-CM

## 2020-09-26 DIAGNOSIS — E119 Type 2 diabetes mellitus without complications: Secondary | ICD-10-CM | POA: Diagnosis not present

## 2020-09-26 DIAGNOSIS — I10 Essential (primary) hypertension: Secondary | ICD-10-CM

## 2020-09-26 MED ORDER — HYDRALAZINE HCL 50 MG PO TABS: 50.0000 mg | ORAL_TABLET | Freq: Three times a day (TID) | ORAL | 1 refills | Status: DC

## 2020-09-26 MED ORDER — CARVEDILOL 25 MG PO TABS: 37.5000 mg | ORAL_TABLET | Freq: Two times a day (BID) | ORAL | 1 refills | Status: DC

## 2020-09-26 MED ORDER — AMLODIPINE BESYLATE 10 MG PO TABS: 10.0000 mg | ORAL_TABLET | Freq: Every day | ORAL | 1 refills | Status: DC

## 2020-09-26 MED ORDER — LOSARTAN POTASSIUM 100 MG PO TABS: 1.0000 | ORAL_TABLET | Freq: Every day | ORAL | 0 refills | Status: DC

## 2020-09-26 NOTE — Patient Instructions (Signed)
Medication Instructions:  Your physician recommends that you continue on your current medications as directed. Please refer to the Current Medication list given to you today.  *If you need a refill on your cardiac medications before your next appointment, please call your pharmacy*   Lab Work: CMP and lipid panel drawn today  If you have labs (blood work) drawn today and your tests are completely normal, you will receive your results only by: Marland Kitchen MyChart Message (if you have MyChart) OR . A paper copy in the mail If you have any lab test that is abnormal or we need to change your treatment, we will call you to review the results.   Testing/Procedures: None ordered   Follow-Up: At [facility], you and your health needs are our priority.  As part of our continuing mission to provide you with exceptional heart care, we have created designated Provider Care Teams.  These Care Teams include your primary Cardiologist (physician) and Advanced Practice Providers (APPs -  Physician Assistants and Nurse Practitioners) who all work together to provide you with the care you need, when you need it.  We recommend signing up for the patient portal called "MyChart".  Sign up information is provided on this After Visit Summary.  MyChart is used to connect with patients for Virtual Visits (Telemedicine).  Patients are able to view lab/test results, encounter notes, upcoming appointments, etc.  Non-urgent messages can be sent to your provider as well.   To learn more about what you can do with MyChart, go to [email or web address].    Your next appointment:   6 month(s)  The format for your next appointment:   In Person  Provider:   You may see Lorine Bears, MD or one of the following Advanced Practice Providers on your designated Care Team:    Nicolasa Ducking, NP  Eula Listen, PA-C  Marisue Ivan, PA-C  Cadence [place], [place]  Gillian Shields, NP

## 2020-09-27 ENCOUNTER — Telehealth: Payer: Self-pay | Admitting: *Deleted

## 2020-09-27 DIAGNOSIS — T466X5A Adverse effect of antihyperlipidemic and antiarteriosclerotic drugs, initial encounter: Secondary | ICD-10-CM

## 2020-09-27 DIAGNOSIS — I2510 Atherosclerotic heart disease of native coronary artery without angina pectoris: Secondary | ICD-10-CM

## 2020-09-27 DIAGNOSIS — E785 Hyperlipidemia, unspecified: Secondary | ICD-10-CM

## 2020-09-27 LAB — COMPREHENSIVE METABOLIC PANEL
ALT: 27 IU/L (ref 0–44)
AST: 25 IU/L (ref 0–40)
Albumin/Globulin Ratio: 1.8 (ref 1.2–2.2)
Albumin: 4.6 g/dL (ref 4.0–5.0)
Alkaline Phosphatase: 57 IU/L (ref 44–121)
BUN/Creatinine Ratio: 9 (ref 9–20)
BUN: 11 mg/dL (ref 6–24)
Bilirubin Total: 0.4 mg/dL (ref 0.0–1.2)
CO2: 20 mmol/L (ref 20–29)
Calcium: 9.9 mg/dL (ref 8.7–10.2)
Chloride: 100 mmol/L (ref 96–106)
Creatinine, Ser: 1.18 mg/dL (ref 0.76–1.27)
Globulin, Total: 2.5 g/dL (ref 1.5–4.5)
Glucose: 101 mg/dL — ABNORMAL HIGH (ref 65–99)
Potassium: 4.7 mmol/L (ref 3.5–5.2)
Sodium: 139 mmol/L (ref 134–144)
Total Protein: 7.1 g/dL (ref 6.0–8.5)
eGFR: 77 mL/min/{1.73_m2} (ref >59)

## 2020-09-27 LAB — LIPID PANEL
Chol/HDL Ratio: 6 ratio — ABNORMAL HIGH (ref 0.0–5.0)
Cholesterol, Total: 247 mg/dL — ABNORMAL HIGH (ref 100–199)
HDL: 41 mg/dL (ref >39)
LDL Chol Calc (NIH): 183 mg/dL — ABNORMAL HIGH (ref 0–99)
Triglycerides: 126 mg/dL (ref 0–149)
VLDL Cholesterol Cal: 23 mg/dL (ref 5–40)

## 2020-09-27 NOTE — Telephone Encounter (Signed)
Left voicemail message to call back for review of results and recommendations.  

## 2020-09-27 NOTE — Telephone Encounter (Signed)
-----   Message from Creig Hines, NP sent at 09/27/2020  7:47 AM EDT ----- Lipids elevated in the absence of any therapy (was prev using repatha, but stopped). Lytes, kidney and liver function, look ok. If he'd be willing to try, given prior intolerances, I'd like to try nexletol 180mg  daily.  If he starts, rec f/u lipids and lfts in six wks.

## 2020-10-02 ENCOUNTER — Other Ambulatory Visit: Payer: Self-pay | Admitting: Family Medicine

## 2020-10-02 DIAGNOSIS — E1169 Type 2 diabetes mellitus with other specified complication: Secondary | ICD-10-CM

## 2020-10-02 NOTE — Telephone Encounter (Signed)
Requested medications are due for refill today.  yes  Requested medications are on the active medications list.  yes  Last refill. 05/31/2020  Future visit scheduled.   no  Notes to clinic.  No protocol assigned.

## 2020-10-02 NOTE — Telephone Encounter (Signed)
Pt called saying he was at [facility] wanting to know when his prescription will be ready .  Has it been approved by Dr. Valentina Gu yet?  Please let patient know when it has been sen to the pharmacy.

## 2020-10-03 ENCOUNTER — Other Ambulatory Visit: Payer: Self-pay | Admitting: Family Medicine

## 2020-10-03 ENCOUNTER — Telehealth: Payer: Self-pay | Admitting: Nurse Practitioner

## 2020-10-03 DIAGNOSIS — E1169 Type 2 diabetes mellitus with other specified complication: Secondary | ICD-10-CM

## 2020-10-03 MED ORDER — NEXLETOL 180 MG PO TABS: 180.0000 mg | ORAL_TABLET | Freq: Every day | ORAL | 11 refills | Status: DC

## 2020-10-03 NOTE — Telephone Encounter (Signed)
PA for Nexletol was started today via CoverMymeds.

## 2020-10-03 NOTE — Telephone Encounter (Signed)
Spoke with patient and reviewed results and recommendations. He was agreeable to try the Nexletol and then have repeat labs in 6 weeks once he starts that medication. Advised I would send him My Chart information on repeat labs and send in prescription to [facility] on [address]. He verbalized understanding of our conversation, agreement with plan, and had no further questions at this time.

## 2020-10-03 NOTE — Telephone Encounter (Signed)
Medication Refill - Medication: RYBELSUS 7 MG TABS  Has the patient contacted their pharmacy? Yes.   (Agent: If no, request that the patient contact the pharmacy for the refill.) (Agent: If yes, when and what did the pharmacy advise?)  Preferred Pharmacy (with phone number or street name):  [facility] [address] [place] - [address]  [address]) [place] [address]  Phone: [phone] Fax: [phone]    Agent: Please be advised that RX refills may take up to 3 business days. We ask that you follow-up with your pharmacy.

## 2020-10-03 NOTE — Telephone Encounter (Signed)
Requested medication (s) are due for refill today: yes  Requested medication (s) are on the active medication list:yes  Last refill: 05/31/2020  Future visit scheduled: no  Notes to clinic: this refill cannot be delegated    Requested Prescriptions  Pending Prescriptions Disp Refills   RYBELSUS 7 MG TABS 30 tablet 3    Sig: Take 7 mg by mouth daily before breakfast. About 30 min before other food/drink, medicines.      Off-Protocol Failed - 10/03/2020 11:23 AM      Failed - Medication not assigned to a protocol, review manually.      Passed - Valid encounter within last 12 months    Recent Outpatient Visits           5 months ago Encounter for Department of Transportation (DOT) examination for trucking license   [facility], Jodelle Gross, FNP   5 months ago Type 2 diabetes mellitus with other specified complication, without long-term current use of insulin [facility])   [facility], Netta Neat, DO   7 months ago Skin tags, multiple acquired   [facility], Netta Neat, DO   8 months ago Annual physical exam   [facility] Smitty Cords, DO   1 year ago Elevated hemoglobin A1c   [facility] [place], Netta Neat, [place]

## 2020-10-03 NOTE — Telephone Encounter (Signed)
Requested medication (s) are due for refill today: yes  Requested medication (s) are on the active medication list: yes  Last refill:  05/31/20  Future visit scheduled: no  Notes to clinic:  medication not assigned to a protocol   Requested Prescriptions  Pending Prescriptions Disp Refills   RYBELSUS 7 MG TABS 30 tablet 3    Sig: Take 7 mg by mouth daily before breakfast. About 30 min before other food/drink, medicines.      Off-Protocol Failed - 10/03/2020 11:23 AM      Failed - Medication not assigned to a protocol, review manually.      Passed - Valid encounter within last 12 months    Recent Outpatient Visits           5 months ago Encounter for Department of Transportation (DOT) examination for trucking license   [facility], Jodelle Gross, FNP   5 months ago Type 2 diabetes mellitus with other specified complication, without long-term current use of insulin [facility])   [facility] [place], Netta Neat, DO   7 months ago Skin tags, multiple acquired   [facility] [place], Netta Neat, DO   8 months ago Annual physical exam   [facility] Smitty Cords, DO   1 year ago Elevated hemoglobin A1c   [facility], Netta Neat, [place]

## 2020-10-04 NOTE — Telephone Encounter (Signed)
Your PA request has been denied.   "You do not meet the requirements of your plan. Your plan covers this drug when a certain type of cholesterol needs to be lower for you. Your request has been denied based on the information we have"

## 2020-10-30 ENCOUNTER — Encounter: Payer: Self-pay | Admitting: *Deleted

## 2020-11-17 NOTE — Telephone Encounter (Signed)
Note from Ward Givens, NP: June 2022  3.  Hyperlipidemia with statin intolerance: As noted, previously intolerant to Zetia and statins.  Previously on Praluent and then most recently Repatha but discontinued secondary to concerns related to blood sugars.  He does not want to retrial.  Follow-up lipids and LFTs today and will consider adding bempedoic acid.

## 2021-01-14 ENCOUNTER — Other Ambulatory Visit: Payer: Self-pay | Admitting: Cardiovascular Disease

## 2021-03-19 ENCOUNTER — Telehealth: Payer: Self-pay | Admitting: Cardiovascular Disease

## 2021-03-19 DIAGNOSIS — Z024 Encounter for examination for driving license: Secondary | ICD-10-CM

## 2021-03-19 DIAGNOSIS — I2510 Atherosclerotic heart disease of native coronary artery without angina pectoris: Secondary | ICD-10-CM

## 2021-03-19 NOTE — Telephone Encounter (Signed)
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   Scheduled 12-2 Arida patient ok with waiting until exam to schedule if needed.

## 2021-03-21 NOTE — Telephone Encounter (Signed)
Order placed for gxt. Message fwd to scheduling to call the patient to schedule.

## 2021-03-21 NOTE — Telephone Encounter (Signed)
Unable to get before Friday appt .  Will schedule at checkout

## 2021-03-21 NOTE — Telephone Encounter (Signed)
You can go ahead and schedule ETT

## 2021-03-22 NOTE — Progress Notes (Signed)
Cardiology Office Note   Date:  03/23/2021   ID:  Jordan Maxwell, DOB Dec 08, 1975, MRN [id number]  PCP:  Smitty Cords, DO  Cardiologist:   Lorine Bears, MD   Chief Complaint  Patient presents with   Follow-up    No new cardiac concerns       History of Present Illness: Jordan Maxwell is a 45 y.o. male who presents for a follow-up visit regarding coronary artery disease. He was hospitalized in April 2017 with non-ST elevation myocardial infarction. Echocardiogram showed normal LV systolic function. Cardiac catheterization showed severe one-vessel coronary artery disease involving the right coronary artery. He underwent successful angioplasty and drug-eluting stent placement. He has other chronic medical conditions that include hypertension, hyperlipidemia, type 2 diabetes, tobacco use and obesity. He has history of intolerance to statins and Zetia.  He took Repatha in the past with no issues but then stopped the medication due to concerns about worsening diabetes control. He has been doing well and denies chest pain, shortness of breath or palpitations.  Blood pressure continues to be elevated.  He takes hydralazine only once or twice daily.  He takes his other medications regularly.   Past Medical History:  Diagnosis Date   CAD (coronary artery disease)    a. 07/2015 NSTEMI/Cath: 95% stenosed pRCA s/p PCI/DES, otw angiographically nl cors, LVEF 55-65%, no MS/AS; b. 05/2017 ETT (DOT req): Ex time 7:48, no ST/T changes. HTN response; c. 05/2019 ETT (DOT): Ex time 6:00, no ST/T changes. HTN response.   H/O medication noncompliance    Hyperlipidemia    Hypertension    LVH (left ventricular hypertrophy)    a. echo 07/30/2015: EF 55-65%, no RWMA, LV diastolic fxn nl, LA nl in size, PASP nl; b. 05/2017 Echo: EF 60-65%, no wrma. Nl RV fxn.   NSTEMI (non-ST elevated myocardial infarction) (HCC) 07/30/2015   Statin intolerance    Tobacco abuse     Past Surgical History:   Procedure Laterality Date   CARDIAC CATHETERIZATION N/A 07/31/2015   Procedure: Left Heart Cath and Coronary Angiography;  Surgeon: Iran Ouch, MD;  Location: [facility] INVASIVE CV LAB;  Service: Cardiovascular;  Laterality: N/A;   CARDIAC CATHETERIZATION N/A 07/31/2015   Procedure: Coronary Stent Intervention;  Surgeon: Iran Ouch, MD;  Location: [facility] INVASIVE CV LAB;  Service: Cardiovascular;  Laterality: N/A;     Current Outpatient Medications  Medication Sig Dispense Refill   amLODipine (NORVASC) 10 MG tablet Take 1 tablet (10 mg total) by mouth daily. 90 tablet 1   aspirin 81 MG EC tablet Take 1 tablet (81 mg total) by mouth daily. 30 tablet 3   carvedilol (COREG) 25 MG tablet Take 1.5 tablets (37.5 mg total) by mouth 2 (two) times daily with a meal. 270 tablet 1   nitroGLYCERIN (NITROSTAT) 0.4 MG SL tablet Place 1 tablet (0.4 mg total) under the tongue every 5 (five) minutes as needed for chest pain. 25 tablet 3   RYBELSUS 7 MG TABS TAKE 1 TABLET BY MOUTH ONCE DAILY BEFORE BREAKFAST 30 MINUTES BEFORE OTHER FOOD OR DRINK, MEDICINES. 30 tablet 5   No current facility-administered medications for this visit.    Allergies:   Eggs or egg-derived products and Statins    Social History:  The patient  reports that he has been smoking cigarettes. He has a 13.00 pack-year smoking history. His smokeless tobacco use includes chew. He reports current alcohol use. He reports that he does not currently use drugs after having used  the following drugs: Marijuana.   Family History:  The patient's family history includes Diabetes in his mother; Hypertension in his mother.    ROS:  Please see the history of present illness.   Otherwise, review of systems are positive for none.   All other systems are reviewed and negative.    PHYSICAL EXAM: VS:  BP (!) 136/100 (BP Location: Left Arm, Patient Position: Sitting, Cuff Size: Large)   Pulse 99   Ht 6' (1.829 m)   Wt 277 lb (125.6 kg)   SpO2  98%   BMI 37.57 kg/m  , BMI Body mass index is 37.57 kg/m. GEN: Well nourished, well developed, in no acute distress  HEENT: normal  Neck: no JVD, carotid bruits, or masses Cardiac: RRR; no murmurs, rubs, or gallops,no edema  Respiratory:  clear to auscultation bilaterally, normal work of breathing GI: soft, nontender, nondistended, + BS MS: no deformity or atrophy  Skin: warm and dry, no rash Neuro:  Strength and sensation are intact Psych: euthymic mood, full affect   EKG:  EKG is ordered today. EKG showed normal sinus rhythm with nonspecific ST and T wave changes.     Recent Labs: 09/26/2020: ALT 27; BUN 11; Creatinine, Ser 1.18; Potassium 4.7; Sodium 139    Lipid Panel    Component Value Date/Time   CHOL 247 (H) 09/26/2020 0849   TRIG 126 09/26/2020 0849   HDL 41 09/26/2020 0849   CHOLHDL 6.0 (H) 09/26/2020 0849   CHOLHDL 6.8 05/20/2019 0956   VLDL 32 05/20/2019 0956   LDLCALC 183 (H) 09/26/2020 0849      Wt Readings from Last 3 Encounters:  03/23/21 277 lb (125.6 kg)  09/26/20 277 lb (125.6 kg)  05/05/20 287 lb (130.2 kg)       ASSESSMENT AND PLAN:  1.  Coronary artery disease involving native coronary arteries without angina: He is overall doing very well. Continue medical therapy.  Continue aspirin indefinitely. 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  2. Essential hypertension: His blood pressure is still not controlled.  We need to simplify his medications.  He reports difficulty keeping up with cutting carvedilol tablet.  Thus, we will change the dose to 25 mg twice daily.  I discontinued hydralazine and elected to add hydrochlorothiazide to losartan.  Check basic metabolic profile in 1 to 2 weeks.  Continue amlodipine.  3. Hyperlipidemia: Known history of intolerance to statins and Zetia.  He did very well with Repatha but he decided to stop this medication due to worsening glucose.  I discussed with him the importance of controlling his lipids  especially with an LDL of 185 and known history of coronary artery disease.  It is very unlikely that Repatha has anything to do with his glycemic control.  After discussion, he agreed to resume Repatha 140 mg every 2 weeks.  Check lipid and liver profile after 2 months.  4. Tobacco use: I again discussed with him the importance of smoking cessation.     Disposition:   FU with me in 6 months  Signed,  Lorine Bears, MD  03/23/2021 8:37 AM    [facility]

## 2021-03-23 ENCOUNTER — Ambulatory Visit (INDEPENDENT_AMBULATORY_CARE_PROVIDER_SITE_OTHER): Payer: BC Managed Care – PPO | Admitting: Cardiovascular Disease

## 2021-03-23 ENCOUNTER — Encounter: Payer: Self-pay | Admitting: Cardiovascular Disease

## 2021-03-23 ENCOUNTER — Other Ambulatory Visit: Payer: Self-pay

## 2021-03-23 VITALS — BP 136/100 | HR 99 | Ht 72.0 in | Wt 277.0 lb

## 2021-03-23 DIAGNOSIS — E785 Hyperlipidemia, unspecified: Secondary | ICD-10-CM

## 2021-03-23 DIAGNOSIS — I2510 Atherosclerotic heart disease of native coronary artery without angina pectoris: Secondary | ICD-10-CM | POA: Diagnosis not present

## 2021-03-23 DIAGNOSIS — Z720 Tobacco use: Secondary | ICD-10-CM

## 2021-03-23 DIAGNOSIS — I10 Essential (primary) hypertension: Secondary | ICD-10-CM | POA: Diagnosis not present

## 2021-03-23 MED ORDER — LOSARTAN POTASSIUM-HCTZ 100-25 MG PO TABS: 1.0000 | ORAL_TABLET | Freq: Every day | ORAL | 1 refills | Status: DC

## 2021-03-23 MED ORDER — CARVEDILOL 25 MG PO TABS: 25.0000 mg | ORAL_TABLET | Freq: Two times a day (BID) | ORAL | 1 refills | Status: DC

## 2021-03-23 MED ORDER — REPATHA SURECLICK 140 MG/ML ~~LOC~~ SOAJ: 1.0000 "pen " | SUBCUTANEOUS | 5 refills | Status: DC

## 2021-03-23 NOTE — Patient Instructions (Addendum)
Medication Instructions:  Your physician has recommended you make the following change in your medication:   1) STOP Hydralazine  2) DECREASE Carvedilol to 25 mg twice a day. An Rx has been sent to your pharmacy  3) STOP Losartan   4) START Losartan HCTZ 100-25 mg daily. An Rx has been sent to your pharmacy  5) START Repatha 140 mg injected in to the skin every 14 days. An Rx has been sent to your pharmacy   *If you need a refill on your cardiac medications before your next appointment, please call your pharmacy*   Lab Work: Your physician recommends that you return for lab work  (Bmp) : same day as your stress test  Your physician recommends that you return for a FASTING lipid profile and lft : in 2 months   If you have labs (blood work) drawn today and your tests are completely normal, you will receive your results only by: MyChart Message (if you have MyChart) OR A paper copy in the mail If you have any lab test that is abnormal or we need to change your treatment, we will call you to review the results.   Testing/Procedures: Your physician has requested that you have an exercise tolerance test. For further information please visit [email or web address] Please also follow instruction sheet, as given.    Follow-Up: At [facility], you and your health needs are our priority.  As part of our continuing mission to provide you with exceptional heart care, we have created designated Provider Care Teams.  These Care Teams include your primary Cardiologist (physician) and Advanced Practice Providers (APPs -  Physician Assistants and Nurse Practitioners) who all work together to provide you with the care you need, when you need it.  We recommend signing up for the patient portal called "MyChart".  Sign up information is provided on this After Visit Summary.  MyChart is used to connect with patients for Virtual Visits (Telemedicine).  Patients are able to view lab/test results,  encounter notes, upcoming appointments, etc.  Non-urgent messages can be sent to your provider as well.   To learn more about what you can do with MyChart, go to [email or web address].    Your next appointment:   6 month(s)  The format for your next appointment:   In Person  Provider:   You may see Lorine Bears, MD or one of the following Advanced Practice Providers on your designated Care Team:   Nicolasa Ducking, NP Eula Listen, PA-C Cadence Fransico Michael, PA-C:1}    Other Instructions  Exercise Tolerance Test instructions - you may eat a light breakfast/ lunch prior to your procedure - no caffeine for 24 hours prior to your test (coffee, tea, soft drinks, or chocolate)  - no smoking/ vaping for 4 hours prior to your test - you may take your regular medications the day of your test except for: DO NOT take Carvedilol  for 24 hours prior - bring any inhalers with you to your test - wear comfortable clothing & tennis/ non-skid shoes to walk on the treadmill

## 2021-03-26 ENCOUNTER — Telehealth: Payer: Self-pay | Admitting: Cardiovascular Disease

## 2021-03-26 NOTE — Telephone Encounter (Signed)
Called the patient to confirm his 12/6/222 @ 8:30am gxt.  Confirmed that the provider attestation has been signed.  Reviewed with the patient his pre-test instructions. Patient verbalized understanding and has no questions or concerns at this time.   - you may eat a light breakfast/ lunch prior to your procedure - no caffeine for 24 hours prior to your test (coffee, tea, soft drinks, or chocolate)  - no smoking/ vaping for 4 hours prior to your test - you may take your regular medications the day of your test except for: DO NOT take Carvedilol this evening or tomorrow morning. - bring any inhalers with you to your test - wear comfortable clothing & tennis/ non-skid shoes to walk on the treadmill

## 2021-03-27 ENCOUNTER — Ambulatory Visit (INDEPENDENT_AMBULATORY_CARE_PROVIDER_SITE_OTHER): Payer: BC Managed Care – PPO

## 2021-03-27 ENCOUNTER — Other Ambulatory Visit: Payer: Self-pay

## 2021-03-27 ENCOUNTER — Telehealth: Payer: Self-pay | Admitting: Cardiovascular Disease

## 2021-03-27 ENCOUNTER — Other Ambulatory Visit (INDEPENDENT_AMBULATORY_CARE_PROVIDER_SITE_OTHER): Payer: BC Managed Care – PPO

## 2021-03-27 ENCOUNTER — Telehealth: Payer: Self-pay | Admitting: *Deleted

## 2021-03-27 DIAGNOSIS — Z024 Encounter for examination for driving license: Secondary | ICD-10-CM

## 2021-03-27 DIAGNOSIS — I2510 Atherosclerotic heart disease of native coronary artery without angina pectoris: Secondary | ICD-10-CM | POA: Diagnosis not present

## 2021-03-27 DIAGNOSIS — I10 Essential (primary) hypertension: Secondary | ICD-10-CM

## 2021-03-27 NOTE — Telephone Encounter (Signed)
PT requiring PA for Repatha 140 mg/ml inj. PA has been submitted via covermymeds. Awaiting Approval.  Your demographic data has been sent to [employer] successfully!  [employer] typically takes 5-10 minutes to respond, but it may take a little longer in some cases. You will be notified by email when available. You can also check for an update later by opening this request from your dashboard. Please do not fax or call [employer] to resubmit this request. If you need assistance, please chat with CoverMyMeds or call us at [phone].  If it has been longer than 24 hours, please reach out to [employer].

## 2021-03-27 NOTE — Telephone Encounter (Signed)
Patient states that his insurance is not covering Repatha. He states this has happened before. Please call to discuss.

## 2021-03-27 NOTE — Telephone Encounter (Signed)
Pt aware that we are pending PA approval.

## 2021-03-27 NOTE — Telephone Encounter (Signed)
PA has been submitte via covermymeds. Awaiting Approval.  Your demographic data has been sent to [employer] successfully!  [employer] typically takes 5-10 minutes to respond, but it may take a little longer in some cases. You will be notified by email when available. You can also check for an update later by opening this request from your dashboard. Please do not fax or call [employer] to resubmit this request. If you need assistance, please chat with CoverMyMeds or call us at [phone].  If it has been longer than 24 hours, please reach out to [employer].

## 2021-03-28 LAB — BASIC METABOLIC PANEL
BUN/Creatinine Ratio: 14 (ref 9–20)
BUN: 16 mg/dL (ref 6–24)
CO2: 23 mmol/L (ref 20–29)
Calcium: 10.3 mg/dL — ABNORMAL HIGH (ref 8.7–10.2)
Chloride: 101 mmol/L (ref 96–106)
Creatinine, Ser: 1.16 mg/dL (ref 0.76–1.27)
Glucose: 112 mg/dL — ABNORMAL HIGH (ref 70–99)
Potassium: 4.6 mmol/L (ref 3.5–5.2)
Sodium: 139 mmol/L (ref 134–144)
eGFR: 78 mL/min/{1.73_m2} (ref >59)

## 2021-04-02 ENCOUNTER — Other Ambulatory Visit: Payer: Self-pay | Admitting: Family Medicine

## 2021-04-02 DIAGNOSIS — E1169 Type 2 diabetes mellitus with other specified complication: Secondary | ICD-10-CM

## 2021-04-02 NOTE — Telephone Encounter (Signed)
Patient calling in checking on the status of his medication refill for [facility], he says he is completely out. Patient requesting a call back at [phone]

## 2021-04-02 NOTE — Telephone Encounter (Signed)
Requested medication (s) are due for refill today: Yes  Requested medication (s) are on the active medication list: Yes  Last refill:  10/03/20  Future visit scheduled: No  Notes to clinic:  See request.    Requested Prescriptions  Pending Prescriptions Disp Refills   RYBELSUS 7 MG TABS [Pharmacy Med Name: Rybelsus 7 MG Oral Tablet] 30 tablet 0    Sig: TAKE 1 TABLET BY MOUTH ONCE DAILY BEFORE BREAKFAST.  TAKE 30 MINUTES BEFORE OTHER FOOD OR DRINK OR MEDICINES     Off-Protocol Failed - 04/02/2021  9:10 AM      Failed - Medication not assigned to a protocol, review manually.      Passed - Valid encounter within last 12 months    Recent Outpatient Visits           11 months ago Encounter for Department of Transportation (DOT) examination for trucking license   [facility], Jodelle Gross, FNP   11 months ago Type 2 diabetes mellitus with other specified complication, without long-term current use of insulin [facility])   [facility] Smitty Cords, DO   1 year ago Skin tags, multiple acquired   [facility] Smitty Cords, DO   1 year ago Annual physical exam   [facility] Smitty Cords, DO   1 year ago Elevated hemoglobin A1c   [facility] Smitty Cords, DO       Future Appointments             In 5 months Kirke Corin, Chelsea Aus, MD [facility]

## 2021-04-05 ENCOUNTER — Telehealth: Payer: Self-pay

## 2021-04-05 LAB — EXERCISE TOLERANCE TEST
Angina Index: 0
Base ST Depression (mm): 0 mm
Duke Treadmill Score: 8
Estimated workload: 10.2
Exercise duration (min): 8 min
Exercise duration (sec): 17 s
MPHR: 173 {beats}/min
Peak HR: 157 {beats}/min
Percent HR: 90 %
RPE: 15
Rest HR: 106 {beats}/min
ST Depression (mm): 0 mm

## 2021-04-05 NOTE — Telephone Encounter (Signed)
-----   Message from Iran Ouch, MD sent at 04/05/2021 12:38 PM EST ----- Inform patient that  stress test was normal.  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

## 2021-04-05 NOTE — Telephone Encounter (Signed)
Patient returning call. Aware of below and is going to a [place] site that has access to this note and chart for DOT exam.  Patient understands and will call if anything needed further like a letter is needed.

## 2021-04-05 NOTE — Telephone Encounter (Signed)
Pt was denied Repatha 140 mg/ml coverage per plan. I have discussed with Misty Stanley, RN she will forward to Dr. Kirke Corin for further review.

## 2021-04-05 NOTE — Telephone Encounter (Signed)
Patient calling to check status of approval  °

## 2021-04-05 NOTE — Telephone Encounter (Addendum)
Response received from [employer]. Prior auth for Repatha has been denied. The preferred drug under the patients plan is Praluent.  For Repatha to be considered there would need to be documentation that the patient has tried and failed Praluent.  Msg fwd to Dr. Kirke Corin for approval to switch the patients prescription from Repatha to Praluent.

## 2021-04-05 NOTE — Telephone Encounter (Signed)
Noted  

## 2021-04-05 NOTE — Telephone Encounter (Signed)
Called to give the patient gxt results. 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

## 2021-04-06 ENCOUNTER — Telehealth: Payer: Self-pay | Admitting: *Deleted

## 2021-04-06 MED ORDER — PRALUENT 75 MG/ML ~~LOC~~ SOAJ: 75.0000 mg | SUBCUTANEOUS | 5 refills | Status: DC

## 2021-04-06 NOTE — Telephone Encounter (Signed)
Jordan Maxwell (Key: [id number])  Your demographic data has been sent to [employer] successfully!  [employer] typically takes 5-10 minutes to respond, but it may take a little longer in some cases. You will be notified by email when available. You can also check for an update later by opening this request from your dashboard. Please do not fax or call [employer] to resubmit this request. If you need assistance, please chat with CoverMyMeds or call us at [phone].  If it has been longer than 24 hours, please reach out to [employer].

## 2021-04-06 NOTE — Telephone Encounter (Signed)
Spoke with patient and informed him of change in medications. Advised patient that it is to be injected every 2 weeks as was Repatha. Informed him that Rx has been sent to pharmacy. Patient expressed verbal understanding and gratitude for the call.

## 2021-04-06 NOTE — Telephone Encounter (Signed)
Yes we can switch to Praluent 75 mg every 2 weeks.

## 2021-04-06 NOTE — Addendum Note (Signed)
Addended by: Thayer Headings, Thompson Mckim L on: 04/06/2021 11:20 AM   Modules accepted: Orders

## 2021-04-06 NOTE — Telephone Encounter (Signed)
Received rejection from pharmacy stating Praluent will also require a PA. PA has been started for Praluent, please refer to phone encounter for Praluent PA

## 2021-04-06 NOTE — Telephone Encounter (Signed)
Requested Prescriptions   Signed Prescriptions Disp Refills   Alirocumab (PRALUENT) 75 MG/ML SOAJ 2 mL 5    Sig: Inject 75 mg into the skin every 14 (fourteen) days.    Authorizing Provider: Lorine Bears A    Ordering User: Thayer Headings, Hearl Heikes L

## 2021-04-09 NOTE — Telephone Encounter (Signed)
Jordan Maxwell (Key: [id number])  Your information has been submitted to [employer]. To check for an updated outcome later, reopen this PA request from your dashboard.  If [employer] has not responded to your request within 24 hours, contact [employer] at [phone]. If you think there may be a problem with your PA request, use our live chat feature at the bottom right.

## 2021-04-10 NOTE — Telephone Encounter (Signed)
Incoming fax from [employer]   " As long a you remain cover my the state health plan and there are no changes to your plan benefits, this request is approved for the following time periods -- 04/09/2021 through 04/09/2022

## 2021-06-05 ENCOUNTER — Other Ambulatory Visit: Payer: Self-pay

## 2021-06-05 ENCOUNTER — Other Ambulatory Visit (INDEPENDENT_AMBULATORY_CARE_PROVIDER_SITE_OTHER): Payer: BC Managed Care – PPO

## 2021-06-05 DIAGNOSIS — E785 Hyperlipidemia, unspecified: Secondary | ICD-10-CM | POA: Diagnosis not present

## 2021-06-06 LAB — LIPID PANEL
Chol/HDL Ratio: 4.6 ratio (ref 0.0–5.0)
Cholesterol, Total: 178 mg/dL (ref 100–199)
HDL: 39 mg/dL — ABNORMAL LOW (ref >39)
LDL Chol Calc (NIH): 119 mg/dL — ABNORMAL HIGH (ref 0–99)
Triglycerides: 108 mg/dL (ref 0–149)
VLDL Cholesterol Cal: 20 mg/dL (ref 5–40)

## 2021-06-06 LAB — HEPATIC FUNCTION PANEL
ALT: 20 IU/L (ref 0–44)
AST: 24 IU/L (ref 0–40)
Albumin: 4.5 g/dL (ref 4.0–5.0)
Alkaline Phosphatase: 56 IU/L (ref 44–121)
Bilirubin Total: 0.7 mg/dL (ref 0.0–1.2)
Bilirubin, Direct: 0.17 mg/dL (ref 0.00–0.40)
Total Protein: 7.3 g/dL (ref 6.0–8.5)

## 2021-06-08 ENCOUNTER — Telehealth: Payer: Self-pay | Admitting: Cardiovascular Disease

## 2021-06-08 MED ORDER — PRALUENT 150 MG/ML ~~LOC~~ SOAJ: 150.0000 mg | SUBCUTANEOUS | 6 refills | Status: DC

## 2021-06-08 NOTE — Telephone Encounter (Signed)
Iran Ouch, MD  06/08/2021  8:19 AM EST     His cholesterol improved with Praluent but still not at target.  Increase the dose to 150 mg every 2 weeks.

## 2021-06-08 NOTE — Telephone Encounter (Signed)
I spoke with the patient regarding his most recent cholesterol panel results. He is aware of Dr. Jari Sportsman recommendations to increase Praluent to 150 mg every 2 weeks to continue to help drive down his LDL.  The patient voices understanding of these results and MD recommendations and is agreeable.  He advised that he just used his last Praluent 75 mg injection yesterday.  He is doing well with the injections.  He is aware I will update his RX at [facility] on [address] and he can start the increased dose in 2 weeks when his next injection would be due.   The patient again voices understanding.

## 2021-07-31 ENCOUNTER — Other Ambulatory Visit: Payer: Self-pay | Admitting: Family Medicine

## 2021-07-31 DIAGNOSIS — E1169 Type 2 diabetes mellitus with other specified complication: Secondary | ICD-10-CM

## 2021-08-01 NOTE — Telephone Encounter (Signed)
Requested medication (s) are due for refill today: yes ? ?Requested medication (s) are on the active medication list: yes ? ?Last refill:  04/02/21 #30 with 3 RF ? ?Future visit scheduled: no, last visit 05/01/2020 ? ?Notes to clinic:  This med does not have a protocol to follow, please assess. ? ? ?  ? ?Requested Prescriptions  ?Pending Prescriptions Disp Refills  ? RYBELSUS 7 MG TABS [Pharmacy Med Name: Rybelsus 7 MG Oral Tablet] 30 tablet 0  ?  Sig: TAKE 1 TABLET BY MOUTH ONCE DAILY 30 MINUTES BEFORE BREAKFAST OR OTHER FOOD, DRINK, OR MEDICINE.  ?  ? Off-Protocol Failed - 07/31/2021 10:01 AM  ?  ?  Failed - Medication not assigned to a protocol, review manually.  ?  ?  Failed - Valid encounter within last 12 months  ?  Recent Outpatient Visits   ? ?      ? 1 year ago Encounter for Department of Transportation (DOT) examination for trucking license  ? [facility], Jodelle Gross, FNP  ? 1 year ago Type 2 diabetes mellitus with other specified complication, without long-term current use of insulin (HCC)  ? [facility], Netta Neat, DO  ? 1 year ago Skin tags, multiple acquired  ? [facility], Netta Neat, DO  ? 1 year ago Annual physical exam  ? [facility] Smitty Cords, DO  ? 1 year ago Elevated hemoglobin A1c  ? [facility] Smitty Cords, DO  ? ?  ?  ?Future Appointments   ? ?        ? In 1 month Arida, Chelsea Aus, MD [facility]  ? ?  ? ?  ?  ?  ? ? ?

## 2021-08-15 ENCOUNTER — Other Ambulatory Visit: Payer: Self-pay | Admitting: Nurse Practitioner

## 2021-08-31 ENCOUNTER — Ambulatory Visit: Payer: BC Managed Care – PPO | Admitting: Family Medicine

## 2021-08-31 ENCOUNTER — Encounter: Payer: Self-pay | Admitting: Family Medicine

## 2021-08-31 VITALS — BP 130/88 | HR 91 | Ht 72.0 in | Wt 266.0 lb

## 2021-08-31 DIAGNOSIS — E1169 Type 2 diabetes mellitus with other specified complication: Secondary | ICD-10-CM | POA: Diagnosis not present

## 2021-08-31 MED ORDER — RYBELSUS 14 MG PO TABS: 14.0000 mg | ORAL_TABLET | Freq: Every day | ORAL | 3 refills | Status: DC

## 2021-08-31 NOTE — Assessment & Plan Note (Signed)
Prior A1c controlled ?Repeat A1c lab today ?T2DM within range ?Concern with obesity, HTN, HLD, CAD, fam history DM ? ?Plan:  ?1. Dose increase Rybelsus from 7 to 14mg  daily. ?Encourage improved lifestyle - low carb, low sugar diet, reduce portion size, continue improving regular exercise ?He can Request record [place] for DM eye - or repeat it ?On [facility], [facility] ?

## 2021-08-31 NOTE — Progress Notes (Signed)
? ?Subjective:  ? ? Patient ID: Jordan Maxwell, male    DOB: 01/21/1976, 46 y.o.   MRN: [id number] ? ?Jordan Maxwell is a 46 y.o. male presenting on 08/31/2021 for Diabetes ? ? ?HPI ? ?Type 2 Diabetes / Morbid Obesity BMI >36 ?Due for repeat A1c ?Improving on GLP1 and with weight loss 11 lbs ?Continues Rybelsus 7mg  daily tolerating well ?He has fam history of DM ?Limited exercise but he plans to keep improving. ?He had recent DM Eye - thinks at [facility] but will need to get copy of report ?He is interested in weight loss and other DM medicine as discussed ? ? ? ?  08/31/2021  ?  8:23 AM 05/01/2020  ?  8:06 AM 01/27/2020  ?  8:10 AM  ?Depression screen PHQ 2/9  ?Decreased Interest 0 0 0  ?Down, Depressed, Hopeless 0 0 0  ?PHQ - 2 Score 0 0 0  ?Altered sleeping 0    ?Tired, decreased energy 0    ?Change in appetite 0    ?Feeling bad or failure about yourself  0    ?Trouble concentrating 0    ?Moving slowly or fidgety/restless 0    ?Suicidal thoughts 0    ?PHQ-9 Score 0    ?Difficult doing work/chores Not difficult at all    ? ? ?Social History  ? ?Tobacco Use  ? Smoking status: Every Day  ?  Packs/day: 0.50  ?  Years: 26.00  ?  Pack years: 13.00  ?  Types: Cigarettes  ? Smokeless tobacco: Current  ?  Types: Chew  ? Tobacco comments:  ?  Pack every two days  ?Vaping Use  ? Vaping Use: Never used  ?Substance Use Topics  ? Alcohol use: Yes  ?  Alcohol/week: 0.0 standard drinks  ?  Comment: ocassionally  ? Drug use: Not Currently  ?  Types: Marijuana  ?  Comment: past  ? ? ?Review of Systems ?Per HPI unless specifically indicated above ? ?   ?Objective:  ?  ?BP 130/88   Pulse 91   Ht 6' (1.829 m)   Wt 266 lb (120.7 kg)   SpO2 100%   BMI 36.08 kg/m?   ?Wt Readings from Last 3 Encounters:  ?08/31/21 266 lb (120.7 kg)  ?03/23/21 277 lb (125.6 kg)  ?09/26/20 277 lb (125.6 kg)  ?  ?Physical Exam ?Vitals and nursing note reviewed.  ?Constitutional:   ?   General: He is not in acute distress. ?   Appearance: Normal  appearance. He is well-developed. He is not diaphoretic.  ?   Comments: Well-appearing, comfortable, cooperative  ?HENT:  ?   Head: Normocephalic and atraumatic.  ?Eyes:  ?   General:     ?   Right eye: No discharge.     ?   Left eye: No discharge.  ?   Conjunctiva/sclera: Conjunctivae normal.  ?Cardiovascular:  ?   Rate and Rhythm: Normal rate.  ?Pulmonary:  ?   Effort: Pulmonary effort is normal.  ?Skin: ?   General: Skin is warm and dry.  ?   Findings: No erythema or rash.  ?Neurological:  ?   Mental Status: He is alert and oriented to person, place, and time.  ?Psychiatric:     ?   Mood and Affect: Mood normal.     ?   Behavior: Behavior normal.     ?   Thought Content: Thought content normal.  ?   Comments: Well groomed, good eye  contact, normal speech and thoughts  ? ? ?Diabetic Foot Exam - Simple   ?Simple Foot Form ?Diabetic Foot exam was performed with the following findings: Yes 08/31/2021  8:33 AM  ?Visual Inspection ?See comments: Yes ?Sensation Testing ?Intact to touch and monofilament testing bilaterally: Yes ?Pulse Check ?Posterior Tibialis and Dorsalis pulse intact bilaterally: Yes ?Comments ?Bilatreal great toenail missing. Healed. Mild callus but no ulceration. Intact monofilament ?  ? ? ? ?Results for orders placed or performed in visit on 06/05/21  ?Lipid panel  ?Result Value Ref Range  ? Cholesterol, Total 178 100 - 199 mg/dL  ? Triglycerides 108 0 - 149 mg/dL  ? HDL 39 (L) >39 mg/dL  ? VLDL Cholesterol Cal 20 5 - 40 mg/dL  ? LDL Chol Calc (NIH) 119 (H) 0 - 99 mg/dL  ? Chol/HDL Ratio 4.6 0.0 - 5.0 ratio  ?Hepatic function panel  ?Result Value Ref Range  ? Total Protein 7.3 6.0 - 8.5 g/dL  ? Albumin 4.5 4.0 - 5.0 g/dL  ? Bilirubin Total 0.7 0.0 - 1.2 mg/dL  ? Bilirubin, Direct 0.17 0.00 - 0.40 mg/dL  ? Alkaline Phosphatase 56 44 - 121 IU/L  ? AST 24 0 - 40 IU/L  ? ALT 20 0 - 44 IU/L  ? ?   ?Assessment & Plan:  ? ?Problem List Items Addressed This Visit   ? ? Type 2 diabetes mellitus with other  specified complication ([place]) - Primary  ?  Prior A1c controlled ?Repeat A1c lab today ?T2DM within range ?Concern with obesity, HTN, HLD, CAD, fam history DM ? ?Plan:  ?1. Dose increase Rybelsus from 7 to 14mg  daily. ?Encourage improved lifestyle - low carb, low sugar diet, reduce portion size, continue improving regular exercise ?He can Request record [place] for DM eye - or repeat it ?On [facility], [facility] ?  ?  ? Relevant Medications  ? RYBELSUS 14 MG TABS  ? Other Relevant Orders  ? Hemoglobin A1c  ? Urine Microalbumin w/creat. ratio  ?  ? ? ?Orders Placed This Encounter  ?Procedures  ? Hemoglobin A1c  ? Urine Microalbumin w/creat. ratio  ? ? ? ?Meds ordered this encounter  ?Medications  ? RYBELSUS 14 MG TABS  ?  Sig: Take 1 tablet (14 mg total) by mouth daily.  ?  Dispense:  90 tablet  ?  Refill:  3  ?  Update inc dose and 90 day supply  ? ? ? ? ?Follow up plan: ?Return in about 6 months (around 03/03/2022) for 6 month Annual Physical AM apt fasting lab AFTER. ? ? ?Nobie Putnam, DO ?[facility] ?[facility] ?08/31/2021, 8:29 AM ?

## 2021-08-31 NOTE — Patient Instructions (Addendum)
Thank you for coming to the office today. ? ?Dose increase Rybelsus from 7 mg up to 14mg  new order, 90 day, should use same copay card if not, then let me know or pharmacy can help. ? ?Blood A1c today ? ?Urine test for kidneys ? ?DUE for FASTING BLOOD WORK (no food or drink after midnight before the lab appointment, only water or coffee without cream/sugar on the morning of) ? ?Day of physical in future. ? ? ?Please schedule a Follow-up Appointment to: Return in about 6 months (around 03/03/2022) for 6 month Annual Physical AM apt fasting lab AFTER. ? ?If you have any other questions or concerns, please feel free to call the office or send a message through MyChart. You may also schedule an earlier appointment if necessary. ? ?Additionally, you may be receiving a survey about your experience at our office within a few days to 1 week by e-mail or mail. We value your feedback. ? ?13/03/2022, DO ?[facility], [facility] ?

## 2021-09-01 LAB — HEMOGLOBIN A1C
Hgb A1c MFr Bld: 5.7 % of total Hgb — ABNORMAL HIGH (ref <5.7)
Mean Plasma Glucose: 117 mg/dL
eAG (mmol/L): 6.5 mmol/L

## 2021-09-01 LAB — MICROALBUMIN / CREATININE URINE RATIO
Creatinine, Urine: 127 mg/dL (ref 20–320)
Microalb Creat Ratio: 7 mcg/mg creat (ref <30)
Microalb, Ur: 0.9 mg/dL

## 2021-09-14 ENCOUNTER — Ambulatory Visit: Payer: BC Managed Care – PPO | Admitting: Internal Medicine

## 2021-09-14 ENCOUNTER — Encounter: Payer: Self-pay | Admitting: Internal Medicine

## 2021-09-14 VITALS — BP 134/90 | HR 106 | Temp 96.8°F | Ht 72.0 in | Wt 267.0 lb

## 2021-09-14 DIAGNOSIS — Z024 Encounter for examination for driving license: Secondary | ICD-10-CM

## 2021-09-14 NOTE — Progress Notes (Signed)
Commercial Driver Medical Examination   Jordan Maxwell is a 46 y.o. male who presents today for a commercial driver fitness determination physical exam. The patient has a history of HTN, HLD with CAD status post MI, DM 2.  His last A1c was 5.7%, 08/2021.  Review of Systems   Past Medical History:  Diagnosis Date   CAD (coronary artery disease)    a. 07/2015 NSTEMI/Cath: 95% stenosed pRCA s/p PCI/DES, otw angiographically nl cors, LVEF 55-65%, no MS/AS; b. 05/2017 ETT (DOT req): Ex time 7:48, no ST/T changes. HTN response; c. 05/2019 ETT (DOT): Ex time 6:00, no ST/T changes. HTN response.   H/O medication noncompliance    Hyperlipidemia    Hypertension    LVH (left ventricular hypertrophy)    a. echo 07/30/2015: EF 55-65%, no RWMA, LV diastolic fxn nl, LA nl in size, PASP nl; b. 05/2017 Echo: EF 60-65%, no wrma. Nl RV fxn.   NSTEMI (non-ST elevated myocardial infarction) (HCC) 07/30/2015   Statin intolerance    Tobacco abuse     Current Outpatient Medications  Medication Sig Dispense Refill   Alirocumab (PRALUENT) 150 MG/ML SOAJ Inject 150 mg into the skin every 14 (fourteen) days. 2 mL 6   amLODipine (NORVASC) 10 MG tablet Take 1 tablet by mouth once daily 90 tablet 0   aspirin 81 MG EC tablet Take 1 tablet (81 mg total) by mouth daily. 30 tablet 3   carvedilol (COREG) 25 MG tablet Take 1 tablet (25 mg total) by mouth 2 (two) times daily with a meal. 180 tablet 1   losartan-hydrochlorothiazide (HYZAAR) 100-25 MG tablet Take 1 tablet by mouth daily. 90 tablet 1   nitroGLYCERIN (NITROSTAT) 0.4 MG SL tablet Place 1 tablet (0.4 mg total) under the tongue every 5 (five) minutes as needed for chest pain. 25 tablet 3   RYBELSUS 14 MG TABS Take 1 tablet (14 mg total) by mouth daily. 90 tablet 3   No current facility-administered medications for this visit.    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    Comment: [employer]  Tobacco Use   Smoking status: Every Day    Packs/day: 0.50    Years: 26.00    Pack years: 13.00    Types: Cigarettes   Smokeless tobacco: Current    Types: Chew   Tobacco comments:    Pack every two days  Vaping Use   Vaping Use: Never used  Substance and Sexual Activity   Alcohol use: Yes    Alcohol/week: 0.0 standard drinks    Comment: ocassionally   Drug use: Not Currently    Types: Marijuana    Comment: past   Sexual activity: Yes    Partners: Female    Birth control/protection: None  Other Topics Concern   Not on file  Social History Narrative   Not on file   Social Determinants of Health   Financial Resource Strain: Not on file  Food Insecurity: Not on file  Transportation Needs: Not on file  Physical Activity: Not on file  Stress: Not on file  Social Connections: Not on file  Intimate Partner Violence: Not on file     Constitutional: Denies fever, malaise, fatigue, headache or abrupt weight changes.  HEENT: Denies eye pain, eye redness, ear pain, ringing in the ears, wax buildup, runny nose, nasal congestion, bloody nose, or sore throat. Respiratory: Denies difficulty breathing, shortness of breath, cough or sputum production.   Cardiovascular: Denies chest pain, chest tightness, palpitations or swelling in the hands or feet.  Gastrointestinal: Denies abdominal pain, bloating, constipation, diarrhea or blood in the stool.  GU: Denies urgency, frequency, pain with urination, burning sensation, blood in urine, odor or discharge. Musculoskeletal: Denies decrease in range of motion, difficulty with gait, muscle pain  or joint pain and swelling.  Skin: Denies redness, rashes, lesions or ulcercations.  Neurological: Denies dizziness, difficulty with memory, difficulty with speech or problems with balance and coordination.  Psych: Denies anxiety, depression, SI/HI.  No other specific complaints in a complete review of systems (except as listed in HPI above).   Objective:    Vision:  Uncorrected Corrected Horizontal Field of Vision  Right Eye 20/13  70 degrees  Left Eye  20/13  70 degrees  Both Eyes  20/15     Applicant can recognize and distinguish among traffic control signals and devices showing standard red, green, and amber colors.   Monocular Vision?: No   Hearing:    Whisper Test:  Right Ear: > 5 feet  Left Ear: > 46ffeet   500 Hz 1000 Hz 2000 Hz 4000 Hz  Right Ear       Left Ear        No hearing aid requirement  BP (!) 158/106 (BP Location: Left Arm, Patient Position: Sitting, Cuff Size: Large)   Pulse (!) 106   Temp (!) 96.8 F (36 C) (Temporal)   Ht 6' (1.829 m)   Wt 267 lb (121.1 kg)   SpO2 99%   BMI 36.21 kg/m   Repeat Blood Pressure: 134/90  Wt Readings from Last 3 Encounters:  08/31/21 266 lb (120.7 kg)  03/23/21 277 lb (125.6 kg)  09/26/20 277 lb (125.6 kg)    General: Appears his stated age, obese, in NAD. HEENT: Head: normal shape and size; Eyes: sclera white, no icterus, conjunctiva pink, PERRLA and EOMs intact; Ears: Tm's gray and intact, normal light reflex;  Cardiovascular: Normal rate and rhythm. S1,S2 noted.  No murmur, rubs or gallops noted. No JVD or BLE edema. Pulmonary/Chest: Normal effort and positive vesicular breath sounds. No respiratory distress. No wheezes, rales or ronchi noted.  Abdomen: Soft and nontender. Normal bowel sounds. No distention or masses noted.  Musculoskeletal: Normal range of motion. Strength 5/5 BUE/BLE. No difficulty with gait.  Neurological: Alert and oriented. Cranial nerves II-XII grossly intact. Coordination normal.   Psychiatric: Mood and affect normal. Behavior is normal. Judgment and thought content normal.    BMET    Component Value Date/Time   NA 139 03/27/2021 0816   NA 135 (L) 11/04/2013 1046   K 4.6 03/27/2021 0816   K 3.8 11/04/2013 1046   CL 101 03/27/2021 0816   CL 103 11/04/2013 1046   CO2 23 03/27/2021 0816   CO2 25 11/04/2013 1046   GLUCOSE 112 (H) 03/27/2021 0816   GLUCOSE 123 (H) 01/20/2020 0755   GLUCOSE 90 11/04/2013 1046   BUN 16 03/27/2021 0816   BUN 12 11/04/2013 1046  CREATININE 1.16 03/27/2021 0816   CREATININE 1.02 01/20/2020 0755   CALCIUM 10.3 (H) 03/27/2021 0816   CALCIUM 9.1 11/04/2013 1046   GFRNONAA 88 01/20/2020 0755   GFRAA 102 01/20/2020 0755    Lipid Panel     Component Value Date/Time   CHOL 178 06/05/2021 0816   TRIG 108 06/05/2021 0816   HDL 39 (L) 06/05/2021 0816   CHOLHDL 4.6 06/05/2021 0816   CHOLHDL 6.8 05/20/2019 0956   VLDL 32 05/20/2019 0956   LDLCALC 119 (H) 06/05/2021 0816    CBC    Component Value Date/Time   WBC 4.5 01/20/2020 0755   RBC 5.13 01/20/2020 0755   HGB 16.2 01/20/2020 0755   HGB 17.1 08/10/2015 1420   HCT 48.4 01/20/2020 0755   HCT 49.3 08/10/2015 1420   PLT 314 01/20/2020 0755   PLT 354 08/10/2015 1420   MCV 94.3 01/20/2020 0755   MCV 91 08/10/2015 1420   MCV 95 11/04/2013 1046   MCH 31.6 01/20/2020 0755   MCHC 33.5 01/20/2020 0755   RDW 12.7 01/20/2020 0755   RDW 13.9 08/10/2015 1420   RDW 13.6 11/04/2013 1046   LYMPHSABS 1,337 01/20/2020 0755   LYMPHSABS 1.7 08/10/2015 1420   MONOABS 0.5 02/13/2016 0729   EOSABS 140 01/20/2020 0755   EOSABS 0.1 08/10/2015 1420   BASOSABS 41 01/20/2020 0755   BASOSABS 0.0 08/10/2015 1420    Hgb A1C Lab Results  Component Value Date   HGBA1C 5.7 (H) 08/31/2021      Labs:  Urinalysis:  Specific Gravity: 1.020 Protein: Neg, Glucose: Neg, Blood: Neg  Assessment:    Healthy male exam.  Meets standards, but periodic monitoring required due to DM 2.  Driver  qualified only for 1 year.    Plan:    Medical examiners certificate completed and printed. Return as needed.    Nicki Reaper, NP

## 2021-09-20 ENCOUNTER — Other Ambulatory Visit: Payer: Self-pay | Admitting: Cardiovascular Disease

## 2021-09-26 ENCOUNTER — Telehealth: Payer: Self-pay | Admitting: Physician Assistant

## 2021-09-26 NOTE — Telephone Encounter (Signed)
TOC Patient  Please call Patient    Patient have an appointment with Vin on 10-12-21

## 2021-09-27 ENCOUNTER — Ambulatory Visit: Payer: BC Managed Care – PPO | Admitting: Cardiovascular Disease

## 2021-11-01 NOTE — Progress Notes (Signed)
Cardiology Office Note    Date:  11/02/2021   ID:  Jordan Maxwell, DOB 06/13/1975, MRN [id number]  PCP:  Smitty Cords, DO  Cardiologist:  Lorine Bears, MD  Electrophysiologist:  None   Chief Complaint: Follow-up  History of Present Illness:   Jordan Maxwell is a 46 y.o. male with history of CAD with NSTEMI in 07/2015 status post PCI/DES to the RCA, DM2, HTN, HLD with statin and Zetia intolerance, obesity, and tobacco use who presents for follow-up of his CAD.  He was admitted to the hospital in 07/2015 with a non-STEMI.  LHC showed a 95% proximal RCA stenosis with otherwise nonobstructive disease as detailed below.  He underwent successful PCI/DES to the RCA.  Echo at that time demonstrated an EF of 55 to 65%, mild LVH, no regional wall motion abnormalities, normal LV diastolic function parameters, normal RV systolic function and PASP.  Over the years, he has undergone ETT per DOT guidelines since his MI, most recently in 03/2021 which was normal with good exercise capacity.  He was last seen in the office in 03/2021 and was without symptoms of angina or decompensation.  It was noted he had previously done very well on Repatha, though stopped this medication due to concerns regarding worsening glucose with recommendation to resume medication.  He was subsequently transitioned to Praluent secondary to insurance rejection of Repatha.  He comes in doing well from a cardiac perspective and is without symptoms of angina or decompensation.  No shortness of breath, palpitations, dizziness, presyncope, or syncope.  No significant lower extremity swelling.  With lifestyle modification and Rybelsus, his weight is down 14 pounds by our scale today when compared to his last clinic visit.  Blood pressure remains well controlled.  He is adherent and tolerating all medications.  He does not have any active cardiac issues or concerns at this time.   Labs independently reviewed: 08/2021 - A1c 5.7,  albumin 4.5, AST/ALT normal, TC 178, TG 108, HDL 39, LDL 119 03/2021 - BUN 16, serum creatinine 1.16, potassium 4.6 12/2019 - Hgb 16.2 PLT 314, TSH normal  Past Medical History:  Diagnosis Date   CAD (coronary artery disease)    a. 07/2015 NSTEMI/Cath: 95% stenosed pRCA s/p PCI/DES, otw angiographically nl cors, LVEF 55-65%, no MS/AS; b. 05/2017 ETT (DOT req): Ex time 7:48, no ST/T changes. HTN response; c. 05/2019 ETT (DOT): Ex time 6:00, no ST/T changes. HTN response.   H/O medication noncompliance    Hyperlipidemia    Hypertension    LVH (left ventricular hypertrophy)    a. echo 07/30/2015: EF 55-65%, no RWMA, LV diastolic fxn nl, LA nl in size, PASP nl; b. 05/2017 Echo: EF 60-65%, no wrma. Nl RV fxn.   NSTEMI (non-ST elevated myocardial infarction) (HCC) 07/30/2015   Statin intolerance    Tobacco abuse     Past Surgical History:  Procedure Laterality Date   CARDIAC CATHETERIZATION N/A 07/31/2015   Procedure: Left Heart Cath and Coronary Angiography;  Surgeon: Iran Ouch, MD;  Location: [facility] INVASIVE CV LAB;  Service: Cardiovascular;  Laterality: N/A;   CARDIAC CATHETERIZATION N/A 07/31/2015   Procedure: Coronary Stent Intervention;  Surgeon: Iran Ouch, MD;  Location: [facility] INVASIVE CV LAB;  Service: Cardiovascular;  Laterality: N/A;    Current Medications: Current Meds  Medication Sig   Alirocumab (PRALUENT) 150 MG/ML SOAJ Inject 150 mg into the skin every 14 (fourteen) days.   amLODipine (NORVASC) 10 MG tablet Take 1 tablet by mouth once  daily   aspirin 81 MG EC tablet Take 1 tablet (81 mg total) by mouth daily.   carvedilol (COREG) 25 MG tablet Take 1 tablet (25 mg total) by mouth 2 (two) times daily with a meal.   losartan-hydrochlorothiazide (HYZAAR) 100-25 MG tablet TAKE 1 TABLET BY MOUTH ONCE DAILY. STOP LOSARTAN.   nitroGLYCERIN (NITROSTAT) 0.4 MG SL tablet Place 1 tablet (0.4 mg total) under the tongue every 5 (five) minutes as needed for chest pain.   RYBELSUS 14 MG  TABS Take 1 tablet (14 mg total) by mouth daily.    Allergies:   Eggs or egg-derived products and Statins   Social History   Socioeconomic History   Marital status: Married    Spouse name: Not on file   Number of children: 4   Years of education: Not on file   Highest education level: Not on file  Occupational History   Occupation: [employer] doctor    Comment: [employer]  Tobacco Use   Smoking status: Every Day    Packs/day: 0.50    Years: 26.00    Total pack years: 13.00    Types: Cigarettes   Smokeless tobacco: Current    Types: Chew   Tobacco comments:    Pack every two days  Vaping Use   Vaping Use: Never used  Substance and Sexual Activity   Alcohol use: Yes    Comment: "every change I get"   Drug use: Not Currently    Types: Marijuana    Comment: past   Sexual activity: Yes    Partners: Female    Birth control/protection: None  Other Topics Concern   Not on file  Social History Narrative   Not on file   Social Determinants of Health   Financial Resource Strain: Not on file  Food Insecurity: Not on file  Transportation Needs: Not on file  Physical Activity: Not on file  Stress: Not on file  Social Connections: Not on file     Family History:  The patient's family history includes Diabetes in his mother; Hypertension in his mother.  ROS:   12-point review of systems is negative unless otherwise noted in the HPI.   EKGs/Labs/Other Studies Reviewed:    Studies reviewed were summarized above. The additional studies were reviewed today:  LHC 07/2015: The left ventricular systolic function is normal. Prox RCA lesion, 95% stenosed. Post intervention, there is a 0% residual stenosis.   1. Severe one-vessel coronary artery disease with 95% hazy proximal RCA stenosis. This is likely the culprit for non-ST elevation myocardial infarction. 2. Normal LV systolic function and mildly elevated left ventricular end-diastolic pressure. 3. Successful angioplasty  and drug-eluting stent placement to the proximal right coronary artery.   Recommendations: Dual antiplatelet therapy for at least 6 months and ideally for 12 months. The patient will need assistance with his medications. Brilinta can be switched to Plavix after one month. I switched amlodipine to lisinopril for cost reasons and added carvedilol. Smoking cessation is strongly advised. Discharge home tomorrow if stable. __________   2D echo 07/2015: - Procedure narrative: Transthoracic echocardiography. Image   quality was poor. The study was technically difficult, as a   result of poor sound wave transmission and body habitus. - Left ventricle: The cavity size was normal. Wall thickness was   increased in a pattern of mild LVH. Systolic function was normal.   The estimated ejection fraction was in the range of 55% to 65%.   Wall motion was normal; there  were no regional wall motion   abnormalities. Left ventricular diastolic function parameters   were normal. - Aortic valve: Valve area (Vmax): 1.85 cm^2. - Left atrium: The atrium was normal in size. - Right ventricle: Systolic function was normal. - Pulmonary arteries: Systolic pressure was within the normal   range. __________   ETT 08/2015: There was no ST segment deviation noted during stress. No T wave inversion was noted during stress.   Normal treadmill stress test. Good exercise capacity. He exercised for 8 minutes. Mildly elevated blood pressure at baseline with mild hypertensive response to exercise. Low risk Duke treadmill score. __________   ETT 05/2017: Blood pressure demonstrated a hypertensive response to exercise. There was no ST segment deviation noted during stress. No T wave inversion was noted during stress.   Normal treadmill stress test with no evidence of ischemia. Average exercise capacity with exercise duration of 7 minutes and 48 seconds. Elevated blood pressure at baseline at 165/101 with hypertensive  response to exercise at 218/95 __________   Limited echo 05/2017: - Left ventricle: The cavity size was normal. There was mild   concentric hypertrophy. Systolic function was normal. The   estimated ejection fraction was in the range of 60% to 65%. Wall   motion was normal; there were no regional wall motion   abnormalities. The study is not technically sufficient to allow   evaluation of LV diastolic function. - Left atrium: The atrium was normal in size. - Right ventricle: Systolic function was normal. - Pulmonary arteries: Systolic pressure could not be accurately   estimated. __________   ETT 05/2019: Blood pressure demonstrated a hypertensive response to exercise.   Normal treadmill stress test at 82% MPHR Hypertensive response to exercise with peak BP of 243/89 Below average exercise capacity achieving 7.2 mets and exercise duration of 6 minutes.  __________  ETT 03/27/2021:  Exercise capacity was normal. Stage 3 was reached after exercising for 8 min and 17 sec. Maximum HR of 157 bpm. MPHR 90.0 %. Peak METS 10.2 . The patient experienced no angina during the test.   No ST deviation was noted. The ECG was negative for ischemia.   Prior study available for comparison from 06/02/2019. No changes compared to prior study.   Normal treadmill stress test with good exercise capacity.    EKG:  EKG is ordered today.  The EKG ordered today demonstrates NSR, 98 bpm, no acute ST-T changes  Recent Labs: 03/27/2021: BUN 16; Creatinine, Ser 1.16; Potassium 4.6; Sodium 139 06/05/2021: ALT 20 11/02/2021: Hemoglobin 16.3; Platelets 281  Recent Lipid Panel    Component Value Date/Time   CHOL 178 06/05/2021 0816   TRIG 108 06/05/2021 0816   HDL 39 (L) 06/05/2021 0816   CHOLHDL 4.6 06/05/2021 0816   CHOLHDL 6.8 05/20/2019 0956   VLDL 32 05/20/2019 0956   LDLCALC 119 (H) 06/05/2021 0816    PHYSICAL EXAM:    VS:  BP 112/80 (BP Location: Left Arm, Patient Position: Sitting, Cuff Size:  Large)   Pulse 98   Ht 6' (1.829 m)   Wt 263 lb (119.3 kg)   SpO2 99%   BMI 35.67 kg/m   BMI: Body mass index is 35.67 kg/m.  Physical Exam Vitals reviewed.  Constitutional:      Appearance: He is well-developed.  HENT:     Head: Normocephalic and atraumatic.  Eyes:     General:        Right eye: No discharge.  Left eye: No discharge.  Neck:     Vascular: No JVD.  Cardiovascular:     Rate and Rhythm: Normal rate and regular rhythm.     Pulses:          Posterior tibial pulses are 2+ on the right side and 2+ on the left side.     Heart sounds: Normal heart sounds, S1 normal and S2 normal. Heart sounds not distant. No midsystolic click and no opening snap. No murmur heard.    No friction rub.  Pulmonary:     Effort: Pulmonary effort is normal. No respiratory distress.     Breath sounds: Normal breath sounds. No decreased breath sounds, wheezing or rales.  Chest:     Chest wall: No tenderness.  Abdominal:     General: There is no distension.  Musculoskeletal:     Cervical back: Normal range of motion.     Right lower leg: No edema.     Left lower leg: No edema.  Skin:    General: Skin is warm and dry.     Nails: There is no clubbing.  Neurological:     Mental Status: He is alert and oriented to person, place, and time.  Psychiatric:        Speech: Speech normal.        Behavior: Behavior normal.        Thought Content: Thought content normal.        Judgment: Judgment normal.     Wt Readings from Last 3 Encounters:  11/02/21 263 lb (119.3 kg)  09/14/21 267 lb (121.1 kg)  08/31/21 266 lb (120.7 kg)     ASSESSMENT & PLAN:   CAD involving the native coronary arteries without angina: He continues to do very well without any symptoms concerning for angina or decompensation.  Continue current medical therapy including aspirin indefinitely along with Praluent, amlodipine, carvedilol, losartan, and as needed SL NTG.  Most recent ETT from 03/2021, as part of DOT  regulations, was normal.  No indication for further ischemic testing at this time.  He will be due for another ETT in 03/2023.  HTN: Blood pressure is well controlled in the office today.  He remains on amlodipine, carvedilol, and losartan.  HLD: LDL 119 in 08/2021.  Currently on Praluent.  Fasting today.  Check lipid panel and LFT.  Heart healthy diet recommended.  Obesity: Continued weight loss is encouraged through out of the diet and regular exercise.  He remains on Rybelsus to help reduce CV risk.  Tobacco use: Complete cessation is encouraged   Disposition: F/u with Dr. Kirke Corin or an APP in 6 months.   Medication Adjustments/Labs and Tests Ordered: Current medicines are reviewed at length with the patient today.  Concerns regarding medicines are outlined above. Medication changes, Labs and Tests ordered today are summarized above and listed in the Patient Instructions accessible in Encounters.   Signed, Eula Listen, PA-C 11/02/2021 9:58 AM     [facility] [address] [place], [place] [address] [phone]

## 2021-11-02 ENCOUNTER — Ambulatory Visit: Payer: BC Managed Care – PPO | Admitting: Physician Assistant

## 2021-11-02 ENCOUNTER — Encounter: Payer: Self-pay | Admitting: Physician Assistant

## 2021-11-02 ENCOUNTER — Other Ambulatory Visit
Admission: RE | Admit: 2021-11-02 | Discharge: 2021-11-02 | Disposition: A | Discharge status: Home / Self Care | Payer: BC Managed Care – PPO | Source: Ambulatory Visit | Attending: Physician Assistant | Admitting: Physician Assistant

## 2021-11-02 VITALS — BP 112/80 | HR 98 | Ht 72.0 in | Wt 263.0 lb

## 2021-11-02 DIAGNOSIS — Z720 Tobacco use: Secondary | ICD-10-CM

## 2021-11-02 DIAGNOSIS — E785 Hyperlipidemia, unspecified: Secondary | ICD-10-CM | POA: Insufficient documentation

## 2021-11-02 DIAGNOSIS — I2510 Atherosclerotic heart disease of native coronary artery without angina pectoris: Secondary | ICD-10-CM | POA: Insufficient documentation

## 2021-11-02 DIAGNOSIS — I10 Essential (primary) hypertension: Secondary | ICD-10-CM

## 2021-11-02 LAB — LIPID PANEL
Cholesterol: 128 mg/dL (ref 0–200)
HDL: 44 mg/dL (ref >40)
LDL Cholesterol: 71 mg/dL (ref 0–99)
Total CHOL/HDL Ratio: 2.9 RATIO
Triglycerides: 67 mg/dL (ref <150)
VLDL: 13 mg/dL (ref 0–40)

## 2021-11-02 LAB — CBC
HCT: 48 % (ref 39.0–52.0)
Hemoglobin: 16.3 g/dL (ref 13.0–17.0)
MCH: 31 pg (ref 26.0–34.0)
MCHC: 34 g/dL (ref 30.0–36.0)
MCV: 91.4 fL (ref 80.0–100.0)
Platelets: 281 10*3/uL (ref 150–400)
RBC: 5.25 MIL/uL (ref 4.22–5.81)
RDW: 13 % (ref 11.5–15.5)
WBC: 7.3 10*3/uL (ref 4.0–10.5)
nRBC: 0 % (ref 0.0–0.2)

## 2021-11-02 LAB — HEPATIC FUNCTION PANEL
ALT: 23 U/L (ref 0–44)
AST: 20 U/L (ref 15–41)
Albumin: 4.2 g/dL (ref 3.5–5.0)
Alkaline Phosphatase: 47 U/L (ref 38–126)
Bilirubin, Direct: 0.2 mg/dL (ref 0.0–0.2)
Indirect Bilirubin: 0.6 mg/dL (ref 0.3–0.9)
Total Bilirubin: 0.8 mg/dL (ref 0.3–1.2)
Total Protein: 7.4 g/dL (ref 6.5–8.1)

## 2021-11-02 NOTE — Patient Instructions (Signed)
Medication Instructions:  No changes at this time.   *If you need a refill on your cardiac medications before your next appointment, please call your pharmacy*   Lab Work: Lipid, Liver, and CBC today over at the [facility] Entrance at [facility] then go to 1st desk on the right to check in (REGISTRATION)   Lab hours: Monday- Friday (7:30 am- 5:30 pm)  If you have labs (blood work) drawn today and your tests are completely normal, you will receive your results only by: MyChart Message (if you have MyChart) OR A paper copy in the mail If you have any lab test that is abnormal or we need to change your treatment, we will call you to review the results.   Testing/Procedures: None   Follow-Up: At [facility], you and your health needs are our priority.  As part of our continuing mission to provide you with exceptional heart care, we have created designated Provider Care Teams.  These Care Teams include your primary Cardiologist (physician) and Advanced Practice Providers (APPs -  Physician Assistants and Nurse Practitioners) who all work together to provide you with the care you need, when you need it.  Your next appointment:   6 month(s)  The format for your next appointment:   In Person  Provider:   Lorine Bears, MD or Eula Listen, PA-C      Important Information About Sugar

## 2021-11-05 ENCOUNTER — Other Ambulatory Visit: Payer: Self-pay | Admitting: Nurse Practitioner

## 2021-11-12 ENCOUNTER — Telehealth: Payer: Self-pay

## 2021-11-12 NOTE — Telephone Encounter (Signed)
Prior Authorization initiated by [email or web address] for Praluent 150 mg/mL.  KEY: [id number] Response: Your demographic data has been sent to [facility] successfully!  [employer] typically takes 5-10 minutes to respond, but it may take a little longer in some cases. You will be notified by email when available.

## 2021-11-26 ENCOUNTER — Telehealth: Payer: Self-pay | Admitting: Cardiovascular Disease

## 2021-11-26 MED ORDER — PRALUENT 150 MG/ML ~~LOC~~ SOAJ: 150.0000 mg | SUBCUTANEOUS | 4 refills | Status: DC

## 2021-11-26 NOTE — Telephone Encounter (Signed)
Requested Prescriptions   Signed Prescriptions Disp Refills   Alirocumab (PRALUENT) 150 MG/ML SOAJ 2 mL 4    Sig: Inject 150 mg into the skin every 14 (fourteen) days.    Authorizing Provider: Lorine Bears A    Ordering User: Thayer Headings, Aerica Rincon L

## 2021-11-26 NOTE — Telephone Encounter (Signed)
 *  STAT* If patient is at the pharmacy, call can be transferred to refill team.   1. Which medications need to be refilled? (please list name of each medication and dose if known)   Alirocumab (PRALUENT) 150 MG/ML SOAJ    2. Which pharmacy/location (including street and city if local pharmacy) is medication to be sent to?[facility] - [place] ([place]), [place] - [address]  3. Do they need a 30 day or 90 day supply? 30 days

## 2021-11-28 ENCOUNTER — Telehealth: Payer: Self-pay

## 2021-11-28 MED ORDER — REPATHA 140 MG/ML ~~LOC~~ SOSY: 140.0000 mg | PREFILLED_SYRINGE | SUBCUTANEOUS | 1 refills | Status: DC

## 2021-11-28 NOTE — Telephone Encounter (Signed)
Requested Prescriptions   Signed Prescriptions Disp Refills   Evolocumab (REPATHA) 140 MG/ML SOSY 2 mL 1    Sig: Inject 140 mg into the skin every 14 (fourteen) days.    Authorizing Provider: Sondra Barges    Ordering User: Thayer Headings, Ruthell Rummage   Per Desma Maxim, RN, prescription for Repatha sent to patient's pharmacy as it states that it is the preferred drug on patient's formulary. May still require PA.

## 2021-11-28 NOTE — Telephone Encounter (Signed)
Chart reviewed and note from provider for patient to resume Repatha. Please let me know which one insurance approves so I can update provider.

## 2021-11-28 NOTE — Addendum Note (Signed)
Addended by: Thayer Headings, Rella Egelston L on: 11/28/2021 11:46 AM   Modules accepted: Orders

## 2021-11-28 NOTE — Telephone Encounter (Signed)
Is it OK for patient to take Repatha instead of Praluent? Was Repatha associated with elevated glucose levels? PA attempted with following response from insurance:  The preferred product for your patient's health plan is Repatha. Can the patient's treatment be switched to the preferred product?  Has the patient experienced a documented intolerable adverse event to Repatha and the prescriber has a compelling medical rationale for not expecting the same event to occur with Praluent?  Per last office note from 11/02/21: It was noted he had previously done very well on Repatha, though stopped this medication due to concerns regarding worsening glucose with recommendation to resume medication.  He was subsequently transitioned to Praluent secondary to insurance rejection of Repatha.  Thank you!

## 2021-11-30 NOTE — Addendum Note (Signed)
Addended by: Festus Aloe on: 11/30/2021 12:26 PM   Modules accepted: Orders

## 2021-11-30 NOTE — Telephone Encounter (Signed)
Spoke with Oneal Deputy with [employer] [employer] regarding a PA for Repatha. Ramon Dredge states there is no prior authorization required for Repatha and is approved through October 20, 2021 until April 09, 2022.

## 2021-12-03 ENCOUNTER — Telehealth: Payer: Self-pay | Admitting: Family Medicine

## 2021-12-03 ENCOUNTER — Telehealth: Payer: Self-pay

## 2021-12-03 DIAGNOSIS — E1169 Type 2 diabetes mellitus with other specified complication: Secondary | ICD-10-CM

## 2021-12-03 MED ORDER — RYBELSUS 14 MG PO TABS: 14.0000 mg | ORAL_TABLET | Freq: Every day | ORAL | 3 refills | Status: DC

## 2021-12-03 NOTE — Telephone Encounter (Signed)
Copied from CRM [id number]. Topic: General - Other >> Dec 03, 2021  2:29 PM Everette C wrote: Reason for CRM: The patient has called to follow up on their RYBELSUS 14 MG TABS [[id number]]  refill   Dr. Kirtland Bouchard sent the refill in and I have started a PA..I will let the patient know the determination from his insurance when I receive it.   I have sent the patient a my-chart message in regards to this information.

## 2021-12-03 NOTE — Telephone Encounter (Signed)
Pt stated he went to his pharmacy for a refill of the RYBELSUS 14 MG TABS but he was told there are no refills remaining.   Medication Refill - Medication: RYBELSUS 14 MG TABS   Has the patient contacted their pharmacy? Yes.    Preferred Pharmacy (with phone number or street name):  [facility] [address] [place]), [place] - [address] Phone:  [phone]  Fax:  [phone]     Has the patient been seen for an appointment in the last year OR does the patient have an upcoming appointment? Yes.    Agent: Please be advised that RX refills may take up to 3 business days. We ask that you follow-up with your pharmacy.

## 2021-12-03 NOTE — Telephone Encounter (Signed)
[facility] called and spoke to Minerva Areola, Pensions consultant about the refill(s) Rybelsus requested. Advised it was sent on 08/31/21 #90/3 refill(s). She says it's saying a PA is required, the refill is on file. Advised I will send to the provider.

## 2021-12-05 ENCOUNTER — Telehealth: Payer: Self-pay | Admitting: Cardiovascular Disease

## 2021-12-05 NOTE — Telephone Encounter (Signed)
Pt c/o medication issue:  1. Name of Medication: Evolocumab (REPATHA) 140 MG/ML SOSY  2. How are you currently taking this medication (dosage and times per day)? Inject 140 mg into the skin every 14 (fourteen) days. - Subcutaneous  3. Are you having a reaction (difficulty breathing--STAT)?   4. What is your medication issue? Pt called stating he does not want the injectable, he would like the pen. Please advise.

## 2021-12-06 MED ORDER — REPATHA SURECLICK 140 MG/ML ~~LOC~~ SOAJ: 1.0000 | SUBCUTANEOUS | 11 refills | Status: DC

## 2021-12-06 NOTE — Telephone Encounter (Signed)
Spoke with patient and reviewed that order has been changed. He verbalized understanding with no further questions at this time.

## 2021-12-06 NOTE — Telephone Encounter (Signed)
Syringe was called in instead of the pen. I have sent in the sureclick pen. Please let pt know that has been corrected.

## 2021-12-10 ENCOUNTER — Other Ambulatory Visit: Payer: Self-pay | Admitting: Cardiovascular Disease

## 2022-01-02 ENCOUNTER — Telehealth: Payer: Self-pay | Admitting: Cardiovascular Disease

## 2022-01-02 ENCOUNTER — Other Ambulatory Visit: Payer: Self-pay | Admitting: Cardiovascular Disease

## 2022-01-02 NOTE — Telephone Encounter (Signed)
*  STAT* If patient is at the pharmacy, call can be transferred to refill team.   1. Which medications need to be refilled? (please list name of each medication and dose if known) carvedilol (COREG) 25 MG tablet  2. Which pharmacy/location (including street and city if local pharmacy) is medication to be sent to? [facility] - [place] ([place]), [place] - [address]  3. Do they need a 30 day or 90 day supply? 90

## 2022-01-03 MED ORDER — CARVEDILOL 25 MG PO TABS: 25.0000 mg | ORAL_TABLET | Freq: Two times a day (BID) | ORAL | 0 refills | Status: DC

## 2022-01-03 NOTE — Telephone Encounter (Signed)
Requested Prescriptions   Signed Prescriptions Disp Refills   carvedilol (COREG) 25 MG tablet 180 tablet 0    Sig: Take 1 tablet (25 mg total) by mouth 2 (two) times daily with a meal.    Authorizing Provider: Lorine Bears A    Ordering User: Thayer Headings, Lynette Topete L

## 2022-04-18 ENCOUNTER — Telehealth: Payer: Self-pay

## 2022-04-18 NOTE — Telephone Encounter (Signed)
Prior authorization initiated via CoverMyMeds for Repatha 140 MG/ML. Key: [id number]  Response: [facility] received a request from your prescriber for coverage of Repatha SureClick 140MG /ML [place] SOAJ. As long as you remain covered by the [facility] and there are no changes to your plan benefits, this request is approved for the following time period: 04/18/2022 - 04/18/2023

## 2022-06-18 ENCOUNTER — Other Ambulatory Visit: Payer: Self-pay | Admitting: Cardiovascular Disease

## 2022-06-18 NOTE — Telephone Encounter (Signed)
Please contact pt for future appointment. Pt due for 6 month f/u. 

## 2022-08-06 MED ORDER — CARVEDILOL 25 MG PO TABS: 25.0000 mg | ORAL_TABLET | Freq: Two times a day (BID) | ORAL | 0 refills | Status: DC

## 2022-08-06 NOTE — Telephone Encounter (Signed)
voice mail full, unable to schedule follow up

## 2022-08-06 NOTE — Addendum Note (Signed)
Addended by: Kendrick Fries on: 08/06/2022 10:05 AM   Modules accepted: Orders

## 2022-08-06 NOTE — Telephone Encounter (Signed)
Requested Prescriptions   Signed Prescriptions Disp Refills   carvedilol (COREG) 25 MG tablet 60 tablet 0    Sig: Take 1 tablet (25 mg total) by mouth 2 (two) times daily with a meal.    Authorizing Provider: Lorine Bears A    Ordering User: Kendrick Fries

## 2022-08-13 ENCOUNTER — Other Ambulatory Visit: Payer: Self-pay | Admitting: Nurse Practitioner

## 2022-08-13 NOTE — Telephone Encounter (Signed)
Please schedule overdue 6 month F/U appointment for further refills. Thank you! 

## 2022-09-04 ENCOUNTER — Other Ambulatory Visit: Payer: Self-pay

## 2022-09-06 ENCOUNTER — Other Ambulatory Visit: Payer: Self-pay | Admitting: Nurse Practitioner

## 2022-09-17 ENCOUNTER — Ambulatory Visit: Payer: BC Managed Care – PPO | Admitting: Physician Assistant

## 2022-10-04 ENCOUNTER — Ambulatory Visit: Payer: BC Managed Care – PPO | Attending: Physician Assistant | Admitting: Cardiology

## 2022-10-04 ENCOUNTER — Other Ambulatory Visit: Payer: Self-pay | Admitting: Cardiovascular Disease

## 2022-10-04 ENCOUNTER — Encounter: Payer: Self-pay | Admitting: Cardiology

## 2022-10-04 VITALS — BP 126/74 | HR 79 | Ht 72.0 in | Wt 274.0 lb

## 2022-10-04 DIAGNOSIS — I2510 Atherosclerotic heart disease of native coronary artery without angina pectoris: Secondary | ICD-10-CM

## 2022-10-04 DIAGNOSIS — Z720 Tobacco use: Secondary | ICD-10-CM | POA: Diagnosis not present

## 2022-10-04 DIAGNOSIS — F1010 Alcohol abuse, uncomplicated: Secondary | ICD-10-CM

## 2022-10-04 DIAGNOSIS — E785 Hyperlipidemia, unspecified: Secondary | ICD-10-CM

## 2022-10-04 DIAGNOSIS — I252 Old myocardial infarction: Secondary | ICD-10-CM

## 2022-10-04 DIAGNOSIS — I10 Essential (primary) hypertension: Secondary | ICD-10-CM

## 2022-10-04 MED ORDER — NITROGLYCERIN 0.4 MG SL SUBL: 0.4000 mg | SUBLINGUAL_TABLET | SUBLINGUAL | 3 refills | Status: DC | PRN

## 2022-10-04 MED ORDER — CARVEDILOL 25 MG PO TABS: 25.0000 mg | ORAL_TABLET | Freq: Two times a day (BID) | ORAL | 3 refills | Status: DC

## 2022-10-04 MED ORDER — ASPIRIN 81 MG PO TBEC: 81.0000 mg | DELAYED_RELEASE_TABLET | Freq: Every day | ORAL | 3 refills | Status: AC

## 2022-10-04 MED ORDER — REPATHA SURECLICK 140 MG/ML ~~LOC~~ SOAJ: 140.0000 mg | SUBCUTANEOUS | 5 refills | Status: DC

## 2022-10-04 MED ORDER — AMLODIPINE BESYLATE 10 MG PO TABS: ORAL_TABLET | ORAL | 3 refills | Status: DC

## 2022-10-04 MED ORDER — LOSARTAN POTASSIUM-HCTZ 100-25 MG PO TABS: ORAL_TABLET | ORAL | 3 refills | Status: DC

## 2022-10-04 NOTE — Progress Notes (Signed)
Cardiology Office Note:  .   Date:  10/04/2022  ID:  Jordan Maxwell, DOB 1976/02/19, MRN [id number] PCP: Smitty Cords, DO  [facility] Providers Cardiologist:  Lorine Bears, MD    History of Present Illness: .   Jordan Maxwell is a 47 y.o. male with a past medical history of coronary artery disease with NSTEMI in 07/2015 status post PCI/DES to the RCA, type 2 diabetes, hypertension, hyperlipidemia with a statin and Zetia intolerance, obesity, and tobacco use who presents today for follow-up of his coronary artery disease.  He had presented to the hospital 07/2015 with an NSTEMI.  Left heart catheterization showed 95% proximal RCA stenosis with otherwise nonobstructive disease.  He underwent successful PCI/DES to the RCA.  Echo at that time demonstrated an EF of 55 to 60%, mild LVH, no regional wall motion abnormalities, normal LV systolic function.  Over the years he has undergone ETT per DOT guidelines since his MI most recently in 12/22 which was normal with good exercise capacity.  It was noted previously done well on Repatha though stopped this medication due to concerns regarding worsening glucose with recommendations to resume medication and subsequently transitioned to Praluent secondary to insurance reduction of Repatha.  He was last seen in clinic 11/02/2021 by R. Dunn, PA.  At that time he was doing well from a cardiac perspective and was without symptoms of decompensation.  With lifestyle modifications nevertheless his weight was down 40 pounds.  Blood pressure remained well-controlled.  There were no changes made to his medication regimen.  He did need to have a lipid and LFTs checked.  There was also no further DOT requirements for the year his next ETT needed to be scheduled in 2024.  He returns to clinic today without any complaints other than weight gain.  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  States that he works all the time.  States he does  continue to smoke and drink.  Has denied any hospitalizations or visits to the emergency department.  Chest pain, shortness of breath, peripheral edema, palpitations.  States that he has been compliant with his medications with the exception of his Repatha.  He stated that he first called for refill on his Repatha previously when he went to pick them up from the pharmacy.  The autoinjector was needles and he did not take any of that medication.  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  ROS: 10 point review of systems been completed and considered negative with exception of what is listed in the HPI  Studies Reviewed: Marland Kitchen    EKG: Sinus rhythm with a rate of 79, no acute changes noted from previous studies  LHC 07/2015: The left ventricular systolic function is normal. Prox RCA lesion, 95% stenosed. Post intervention, there is a 0% residual stenosis.   1. Severe one-vessel coronary artery disease with 95% hazy proximal RCA stenosis. This is likely the culprit for non-ST elevation myocardial infarction. 2. Normal LV systolic function and mildly elevated left ventricular end-diastolic pressure. 3. Successful angioplasty and drug-eluting stent placement to the proximal right coronary artery.   Recommendations: Dual antiplatelet therapy for at least 6 months and ideally for 12 months. The patient will need assistance with his medications. Brilinta can be switched to Plavix after one month. I switched amlodipine to lisinopril for cost reasons and added carvedilol. Smoking cessation is strongly advised. Discharge home tomorrow if stable. __________   2D echo 07/2015: -  Procedure narrative: Transthoracic echocardiography. Image   quality was poor. The study was technically difficult, as a   result of poor sound wave transmission and body habitus. - Left ventricle: The cavity size was normal. Wall thickness was   increased in a pattern of mild LVH. Systolic function was normal.    The estimated ejection fraction was in the range of 55% to 65%.   Wall motion was normal; there were no regional wall motion   abnormalities. Left ventricular diastolic function parameters   were normal. - Aortic valve: Valve area (Vmax): 1.85 cm^2. - Left atrium: The atrium was normal in size. - Right ventricle: Systolic function was normal. - Pulmonary arteries: Systolic pressure was within the normal   range. __________   ETT 08/2015: There was no ST segment deviation noted during stress. No T wave inversion was noted during stress.   Normal treadmill stress test. Good exercise capacity. He exercised for 8 minutes. Mildly elevated blood pressure at baseline with mild hypertensive response to exercise. Low risk Duke treadmill score. __________   ETT 05/2017: Blood pressure demonstrated a hypertensive response to exercise. There was no ST segment deviation noted during stress. No T wave inversion was noted during stress.   Normal treadmill stress test with no evidence of ischemia. Average exercise capacity with exercise duration of 7 minutes and 48 seconds. Elevated blood pressure at baseline at 165/101 with hypertensive response to exercise at 218/95 __________   Limited echo 05/2017: - Left ventricle: The cavity size was normal. There was mild   concentric hypertrophy. Systolic function was normal. The   estimated ejection fraction was in the range of 60% to 65%. Wall   motion was normal; there were no regional wall motion   abnormalities. The study is not technically sufficient to allow   evaluation of LV diastolic function. - Left atrium: The atrium was normal in size. - Right ventricle: Systolic function was normal. - Pulmonary arteries: Systolic pressure could not be accurately   estimated. __________   ETT 05/2019: Blood pressure demonstrated a hypertensive response to exercise.   Normal treadmill stress test at 82% MPHR Hypertensive response to exercise with peak  BP of 243/89 Below average exercise capacity achieving 7.2 mets and exercise duration of 6 minutes.  __________   ETT 03/27/2021:  Exercise capacity was normal. Stage 3 was reached after exercising for 8 min and 17 sec. Maximum HR of 157 bpm. MPHR 90.0 %. Peak METS 10.2 . The patient experienced no angina during the test.   No ST deviation was noted. The ECG was negative for ischemia.   Prior study available for comparison from 06/02/2019. No changes compared to prior study.   Normal treadmill stress test with good exercise capacity. Risk Assessment/Calculations:             Physical Exam:   VS:  BP 126/74   Pulse 79   Ht 6' (1.829 m)   Wt 274 lb (124.3 kg)   SpO2 97%   BMI 37.16 kg/m    Wt Readings from Last 3 Encounters:  10/04/22 274 lb (124.3 kg)  11/02/21 263 lb (119.3 kg)  09/14/21 267 lb (121.1 kg)    GEN: Well nourished, well developed in no acute distress NECK: No JVD; No carotid bruits CARDIAC: RRR, no murmurs, rubs, gallops RESPIRATORY:  Clear to auscultation without rales, wheezing or rhonchi  ABDOMEN: Soft, non-tender, obese, non-distended EXTREMITIES:  No edema; No deformity   ASSESSMENT AND PLAN: .   Coronary  artery disease involving the native coronary arteries without angina.  He continues to do well without any symptoms of angina or anginal equivalents.  Continued on current medical therapy including aspirin indefinitely along with Repatha, amlodipine, carvedilol, HCTZ, and as needed sublingual nitro.  EKG today revealed sinus rhythm with no ischemic changes.  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  He is also being sent for CMP and CBC as his last blood work was done in 2023.  Hypertension with blood pressure of 126/74.  Blood pressure remained stable and has been well-controlled on his current medication regimen.  He does require refills of all of his medications today.  Mixed hyperlipidemia with an LDL of 71.  Almost at  goal.  That was done in 10/2021.  He has been sent for a repeat lipid panel.  He is not back on Repatha with refill sent today.  Obesity.  He is continued on Rybelsus.  Continue weight loss is encouraged through diet and regular exercise.  He has gained weight since his last appointment in clinic.  He has not been watching his diet lately or having any increased activity.  Does discuss that decreased caloric intake and increasing activity will help reduce his overall CV risk.  Tobacco and alcohol use with complete cessation being encouraged.    Informed Consent   Shared Decision Making/Informed Consent The risks [chest pain, shortness of breath, cardiac arrhythmias, dizziness, blood pressure fluctuations, myocardial infarction, stroke/transient ischemic attack, and life-threatening complications (estimated to be 1 in 10,000)], benefits (risk stratification, diagnosing coronary artery disease, treatment guidance) and alternatives of an exercise tolerance test were discussed in detail with Mr. Barbie and he agrees to proceed.     Dispo: Patient return to clinic to see MD/APP in 1 year or sooner if needed with EKG on return.  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  Signed, Jaidin Richison, NP

## 2022-10-04 NOTE — Patient Instructions (Signed)
Medication Instructions:  Your physician recommends that you continue on your current medications as directed. Please refer to the Current Medication list given to you today.  *If you need a refill on your cardiac medications before your next appointment, please call your pharmacy*  Lab Work: Your physician recommends that you return for lab work day of ETT: CMP, CBC, fasting lipid panel [facility] Entrance at [facility] 1st desk on the right to check in (REGISTRATION)  Lab hours: Monday- Friday (7:30 am- 5:30 pm)  If you have labs (blood work) drawn today and your tests are completely normal, you will receive your results only by: MyChart Message (if you have MyChart) OR A paper copy in the mail If you have any lab test that is abnormal or we need to change your treatment, we will call you to review the results.  Testing/Procedures: Your provider has ordered a exercise tolerance test. This test will evaluate the blood supply to your heart muscle during periods of exercise and rest. For this test, you will raise your heart rate by walking on a treadmill at different levels.   you may eat a light breakfast/ lunch prior to your procedure no caffeine for 24 hours prior to your test (coffee, tea, soft drinks, or chocolate)  no smoking/ vaping for 4 hours prior to your test you may take your regular medications the day of your test except for:   - hold carvedilol (COREG) 25 MG tablet bring any inhalers with you to your test wear comfortable clothing & tennis/ non-skid shoes to walk on the treadmill  This will take place at [address] [address], [place] [address]   Follow-Up: At [facility], you and your health needs are our priority.  As part of our continuing mission to provide you with exceptional heart care, we have created designated Provider Care Teams.  These Care Teams include your primary Cardiologist (physician) and Advanced Practice Providers (APPs -   Physician Assistants and Nurse Practitioners) who all work together to provide you with the care you need, when you need it.  Your next appointment:   1 year(s)  Provider:   You may see Lorine Bears, MD or one of the following Advanced Practice Providers on your designated Care Team:   Nicolasa Ducking, NP Eula Listen, PA-C Cadence Fransico Michael, PA-C Charlsie Quest, NP    Other Instructions -None

## 2022-11-01 ENCOUNTER — Other Ambulatory Visit
Admission: RE | Admit: 2022-11-01 | Discharge: 2022-11-01 | Disposition: A | Discharge status: Home / Self Care | Payer: BC Managed Care – PPO | Source: Ambulatory Visit | Attending: Cardiology | Admitting: Cardiology

## 2022-11-01 ENCOUNTER — Ambulatory Visit
Admission: RE | Admit: 2022-11-01 | Discharge: 2022-11-01 | Disposition: A | Discharge status: Home / Self Care | Payer: BC Managed Care – PPO | Source: Ambulatory Visit | Attending: Cardiology | Admitting: Cardiology

## 2022-11-01 DIAGNOSIS — I10 Essential (primary) hypertension: Secondary | ICD-10-CM | POA: Diagnosis not present

## 2022-11-01 DIAGNOSIS — Z7985 Long-term (current) use of injectable non-insulin antidiabetic drugs: Secondary | ICD-10-CM | POA: Diagnosis not present

## 2022-11-01 DIAGNOSIS — I2510 Atherosclerotic heart disease of native coronary artery without angina pectoris: Secondary | ICD-10-CM

## 2022-11-01 DIAGNOSIS — E119 Type 2 diabetes mellitus without complications: Secondary | ICD-10-CM | POA: Diagnosis not present

## 2022-11-01 DIAGNOSIS — I252 Old myocardial infarction: Secondary | ICD-10-CM | POA: Diagnosis not present

## 2022-11-01 DIAGNOSIS — R0600 Dyspnea, unspecified: Secondary | ICD-10-CM | POA: Insufficient documentation

## 2022-11-01 DIAGNOSIS — E785 Hyperlipidemia, unspecified: Secondary | ICD-10-CM

## 2022-11-01 LAB — CBC
HCT: 50.5 % (ref 39.0–52.0)
Hemoglobin: 17.7 g/dL — ABNORMAL HIGH (ref 13.0–17.0)
MCH: 31.9 pg (ref 26.0–34.0)
MCHC: 35 g/dL (ref 30.0–36.0)
MCV: 91 fL (ref 80.0–100.0)
Platelets: 290 10*3/uL (ref 150–400)
RBC: 5.55 MIL/uL (ref 4.22–5.81)
RDW: 12.8 % (ref 11.5–15.5)
WBC: 5.3 10*3/uL (ref 4.0–10.5)
nRBC: 0 % (ref 0.0–0.2)

## 2022-11-01 LAB — COMPREHENSIVE METABOLIC PANEL
ALT: 19 U/L (ref 0–44)
AST: 30 U/L (ref 15–41)
Albumin: 4.4 g/dL (ref 3.5–5.0)
Alkaline Phosphatase: 44 U/L (ref 38–126)
Anion gap: 13 (ref 5–15)
BUN: 18 mg/dL (ref 6–20)
CO2: 18 mmol/L — ABNORMAL LOW (ref 22–32)
Calcium: 9.3 mg/dL (ref 8.9–10.3)
Chloride: 106 mmol/L (ref 98–111)
Creatinine, Ser: 1.05 mg/dL (ref 0.61–1.24)
GFR, Estimated: >60 mL/min (ref >60)
Glucose, Bld: 127 mg/dL — ABNORMAL HIGH (ref 70–99)
Potassium: 4 mmol/L (ref 3.5–5.1)
Sodium: 137 mmol/L (ref 135–145)
Total Bilirubin: 0.5 mg/dL (ref 0.3–1.2)
Total Protein: 7.4 g/dL (ref 6.5–8.1)

## 2022-11-01 LAB — LIPID PANEL
Cholesterol: 124 mg/dL (ref 0–200)
HDL: 45 mg/dL (ref >40)
LDL Cholesterol: 60 mg/dL (ref 0–99)
Total CHOL/HDL Ratio: 2.8 RATIO
Triglycerides: 95 mg/dL (ref <150)
VLDL: 19 mg/dL (ref 0–40)

## 2022-11-01 LAB — EXERCISE TOLERANCE TEST
Angina Index: 0
Estimated workload: 10
Exercise duration (min): 9 min
Exercise duration (sec): 0 s
MPHR: 172 {beats}/min
Percent HR: 96 %
Rest HR: 94 {beats}/min
ST Depression (mm): 0 mm

## 2022-11-02 LAB — EXERCISE TOLERANCE TEST
Base ST Depression (mm): 0 mm
Duke Treadmill Score: 9
Peak HR: 166 {beats}/min
RPE: 10

## 2022-11-04 NOTE — Progress Notes (Signed)
Exercise tolerance testing was considered low risk test.  Baseline ECG demonstrated normal sinus rhythm without significant abnormalities, patient demonstrated good exercise capacity and normal heart rate response.  Hypertensive blood pressure response was observed.  But there were no significant ST segment or T wave changes observed during stress or early recovery.

## 2022-11-04 NOTE — Progress Notes (Signed)
Chemistry panel revealed a blood glucose that was slightly elevated at 127.  CBC was unremarkable.  LDL 60 with a total cholesterol that has improved.  Continue to work on dietary changes.  Continue current medication regimen without changes needed at this time.

## 2023-01-06 ENCOUNTER — Other Ambulatory Visit: Payer: Self-pay | Admitting: Family Medicine

## 2023-01-06 DIAGNOSIS — E1169 Type 2 diabetes mellitus with other specified complication: Secondary | ICD-10-CM

## 2023-01-27 ENCOUNTER — Other Ambulatory Visit: Payer: Self-pay | Admitting: Family Medicine

## 2023-01-27 DIAGNOSIS — E1169 Type 2 diabetes mellitus with other specified complication: Secondary | ICD-10-CM

## 2023-01-28 NOTE — Telephone Encounter (Signed)
Requested medication (s) are due for refill today: yes  Requested medication (s) are on the active medication list: yes  Last refill:  12/03/21  Future visit scheduled: no  Notes to clinic:  Medication not assigned to a protocol, review manually.      Requested Prescriptions  Pending Prescriptions Disp Refills   RYBELSUS 14 MG TABS [Pharmacy Med Name: Rybelsus 14 MG Oral Tablet] 90 tablet 0    Sig: Take 1 tablet by mouth once daily     Off-Protocol Failed - 01/27/2023 11:32 AM      Failed - Medication not assigned to a protocol, review manually.      Failed - Valid encounter within last 12 months    Recent Outpatient Visits           1 year ago Encounter for Department of Transportation (DOT) examination for trucking license   [place] [facility] [place], [place] W, NP   1 year ago Type 2 diabetes mellitus with other specified complication, without long-term current use of insulin [facility])   [place] [facility] [place], Netta Neat, DO   2 years ago Encounter for Department of Transportation (DOT) examination for trucking license   [place] [facility] [place], Jodelle Gross, FNP   2 years ago Type 2 diabetes mellitus with other specified complication, without long-term current use of insulin [facility])   [place] [facility] Smitty Cords, DO   2 years ago Skin tags, multiple acquired   [facility] [place], Netta Neat, DO

## 2023-04-02 ENCOUNTER — Other Ambulatory Visit (HOSPITAL_COMMUNITY): Payer: Self-pay

## 2023-05-09 ENCOUNTER — Other Ambulatory Visit: Payer: Self-pay

## 2023-05-09 ENCOUNTER — Ambulatory Visit: Payer: 59 | Admitting: Family Medicine

## 2023-05-09 VITALS — BP 148/90 | HR 87 | Ht 72.0 in | Wt 288.0 lb

## 2023-05-09 DIAGNOSIS — I252 Old myocardial infarction: Secondary | ICD-10-CM | POA: Diagnosis not present

## 2023-05-09 DIAGNOSIS — I2510 Atherosclerotic heart disease of native coronary artery without angina pectoris: Secondary | ICD-10-CM | POA: Diagnosis not present

## 2023-05-09 DIAGNOSIS — I10 Essential (primary) hypertension: Secondary | ICD-10-CM

## 2023-05-09 DIAGNOSIS — E1169 Type 2 diabetes mellitus with other specified complication: Secondary | ICD-10-CM

## 2023-05-09 DIAGNOSIS — Z0000 Encounter for general adult medical examination without abnormal findings: Secondary | ICD-10-CM | POA: Diagnosis not present

## 2023-05-09 DIAGNOSIS — Z125 Encounter for screening for malignant neoplasm of prostate: Secondary | ICD-10-CM

## 2023-05-09 DIAGNOSIS — E785 Hyperlipidemia, unspecified: Secondary | ICD-10-CM

## 2023-05-09 MED ORDER — RYBELSUS 3 MG PO TABS: 3.0000 mg | ORAL_TABLET | Freq: Every day | ORAL | Status: DC

## 2023-05-09 NOTE — Progress Notes (Signed)
Subjective:    Patient ID: Jordan Maxwell, male    DOB: 1975-05-16, 48 y.o.   MRN: [id number]  Jordan Maxwell is a 48 y.o. male presenting on 05/09/2023 for Diabetes, Hypertension, and Obesity   HPI  Discussed the use of AI scribe software for clinical note transcription with the patient, who gave verbal consent to proceed.  History of Present Illness     Type 2 Diabetes Hyperlipidemia Coronary Artery Disease CAD Hypertension Morbid Obesity  Last visit with me in 2023. Lost to follow-up.  The patient, with a history of hypertension and hyperlipidemia, presents after a significant gap in medical follow-up. He reports a recent weight gain and elevated blood pressure. He also mentions a change in insurance and difficulty affording his cholesterol medication, which has led to discontinuation. 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 He also expresses concerns about his weight, which he feels is concentrated in his abdominal area. He has been considering weight loss options, including bariatric surgery.  The patient has been managing his diabetes with Rybelsus, but he has been off this medication for months due to insurance issues. He expresses willingness to restart this medication and is provided with a 30-day trial.  The patient denies any numbness, tingling, or burning in the feet. He also denies any recent changes in vision, although he mentions a recent visit to an ophthalmologist for a stye. He is due for a colon cancer screening and expresses willingness to complete a home test.     He is overdue for labs, will return fasting lab next week.      05/09/2023    8:53 AM 08/31/2021    8:23 AM  05/01/2020    8:06 AM  Depression screen PHQ 2/9  Decreased Interest 0 0 0  Down, Depressed, Hopeless 0 0 0  PHQ - 2 Score 0 0 0  Altered sleeping  0   Tired, decreased energy  0   Change in appetite  0   Feeling bad or failure about yourself   0   Trouble concentrating  0   Moving slowly or fidgety/restless  0   Suicidal thoughts  0   PHQ-9 Score  0   Difficult doing work/chores  Not difficult at all        05/09/2023    8:53 AM 08/31/2021    8:23 AM  GAD 7 : Generalized Anxiety Score  Nervous, Anxious, on Edge 0 0  Control/stop worrying 0 0  Worry too much - different things 0 0  Trouble relaxing 0 0  Restless 0 0  Easily annoyed or irritable 0 0  Afraid - awful might happen 0 0  Total GAD 7 Score 0 0  Anxiety Difficulty  Not difficult at all    Social History   Tobacco Use   Smoking status: Every Day    Current packs/day: 0.50    Average packs/day: 0.5 packs/day for 26.0 years (13.0 ttl pk-yrs)    Types: Cigarettes   Smokeless tobacco: Current    Types: Chew   Tobacco comments:    Pack every two days  Vaping Use   Vaping status: Never Used  Substance Use Topics   Alcohol use: Yes    Comment: "every change I get"   Drug  use: Not Currently    Types: Marijuana    Comment: past    Review of Systems Per HPI unless specifically indicated above     Objective:    BP (!) 148/90 (BP Location: Left Arm, Cuff Size: Normal)   Pulse 87   Ht 6' (1.829 m)   Wt 288 lb (130.6 kg)   SpO2 97%   BMI 39.06 kg/m   Wt Readings from Last 3 Encounters:  05/09/23 288 lb (130.6 kg)  10/04/22 274 lb (124.3 kg)  11/02/21 263 lb (119.3 kg)    Physical Exam Vitals and nursing note reviewed.  Constitutional:      General: He is not in acute distress.    Appearance: He is well-developed. He is obese. He is not diaphoretic.     Comments: Well-appearing, comfortable, cooperative  HENT:     Head: Normocephalic and atraumatic.  Eyes:     General:        Right eye: No  discharge.        Left eye: No discharge.     Conjunctiva/sclera: Conjunctivae normal.     Pupils: Pupils are equal, round, and reactive to light.  Neck:     Thyroid: No thyromegaly.  Cardiovascular:     Rate and Rhythm: Normal rate and regular rhythm.     Pulses: Normal pulses.     Heart sounds: Normal heart sounds. No murmur heard. Pulmonary:     Effort: Pulmonary effort is normal. No respiratory distress.     Breath sounds: Normal breath sounds. No wheezing or rales.  Abdominal:     General: Bowel sounds are normal. There is no distension.     Palpations: Abdomen is soft. There is no mass.     Tenderness: There is no abdominal tenderness.  Musculoskeletal:        General: No tenderness. Normal range of motion.     Cervical back: Normal range of motion and neck supple.     Right lower leg: No edema.     Left lower leg: No edema.     Comments: Upper / Lower Extremities: - Normal muscle tone, strength bilateral upper extremities 5/5, lower extremities 5/5  Lymphadenopathy:     Cervical: No cervical adenopathy.  Skin:    General: Skin is warm and dry.     Findings: No erythema or rash.  Neurological:     Mental Status: He is alert and oriented to person, place, and time.     Comments: Distal sensation intact to light touch all extremities  Psychiatric:        Mood and Affect: Mood normal.        Behavior: Behavior normal.        Thought Content: Thought content normal.     Comments: Well groomed, good eye contact, normal speech and thoughts     Diabetic Foot Exam - Simple   Simple Foot Form Diabetic Foot exam was performed with the following findings: Yes 05/09/2023  9:30 AM  Visual Inspection See comments: Yes Sensation Testing Intact to touch and monofilament testing bilaterally: Yes Pulse Check Posterior Tibialis and Dorsalis pulse intact bilaterally: Yes Comments Loss of great toenails bilateral, some thickened brittle nails. Feet with mild callus formation. No  ulceration. Intact monofilament.       Results for orders placed or performed during the hospital encounter of 11/01/22  Lipid panel   Collection Time: 11/01/22  8:51 AM  Result Value Ref Range   Cholesterol 124 0 - 200  mg/dL   Triglycerides 95 <875 mg/dL   HDL 45 >64 mg/dL   Total CHOL/HDL Ratio 2.8 RATIO   VLDL 19 0 - 40 mg/dL   LDL Cholesterol 60 0 - 99 mg/dL  CBC   Collection Time: 11/01/22  8:51 AM  Result Value Ref Range   WBC 5.3 4.0 - 10.5 K/uL   RBC 5.55 4.22 - 5.81 MIL/uL   Hemoglobin 17.7 (H) 13.0 - 17.0 g/dL   HCT 33.2 95.1 - 88.4 %   MCV 91.0 80.0 - 100.0 fL   MCH 31.9 26.0 - 34.0 pg   MCHC 35.0 30.0 - 36.0 g/dL   RDW 16.6 06.3 - 01.6 %   Platelets 290 150 - 400 K/uL   nRBC 0.0 0.0 - 0.2 %  Comp Met (CMET)   Collection Time: 11/01/22  8:51 AM  Result Value Ref Range   Sodium 137 135 - 145 mmol/L   Potassium 4.0 3.5 - 5.1 mmol/L   Chloride 106 98 - 111 mmol/L   CO2 18 (L) 22 - 32 mmol/L   Glucose, Bld 127 (H) 70 - 99 mg/dL   BUN 18 6 - 20 mg/dL   Creatinine, Ser 0.10 0.61 - 1.24 mg/dL   Calcium 9.3 8.9 - 93.2 mg/dL   Total Protein 7.4 6.5 - 8.1 g/dL   Albumin 4.4 3.5 - 5.0 g/dL   AST 30 15 - 41 U/L   ALT 19 0 - 44 U/L   Alkaline Phosphatase 44 38 - 126 U/L   Total Bilirubin 0.5 0.3 - 1.2 mg/dL   GFR, Estimated >35 >57 mL/min   Anion gap 13 5 - 15      Assessment & Plan:   Problem List Items Addressed This Visit     CAD (coronary artery disease)   Relevant Orders   Lipid panel   COMPLETE METABOLIC PANEL WITH GFR   CBC with Differential/Platelet   AMB Referral [facility] Care Management   Essential hypertension   Relevant Orders   AMB Referral [facility] Care Management   History of non-ST elevation myocardial infarction (NSTEMI)   Hyperlipidemia associated with type 2 diabetes mellitus (HCC)   Relevant Orders   Lipid panel   COMPLETE METABOLIC PANEL WITH GFR   CBC with Differential/Platelet   TSH   AMB Referral [facility] Care Management   Morbid  obesity (HCC)   Relevant Orders   Lipid panel   COMPLETE METABOLIC PANEL WITH GFR   AMB Referral [facility] Care Management   Type 2 diabetes mellitus with other specified complication (HCC)   Relevant Orders   Hemoglobin A1c   Microalbumin / creatinine urine ratio   AMB Referral [facility] Care Management   Other Visit Diagnoses       Annual physical exam    -  Primary   Relevant Orders   Lipid panel   Hemoglobin A1c   COMPLETE METABOLIC PANEL WITH GFR   CBC with Differential/Platelet   PSA   TSH     Screening for prostate cancer       Relevant Orders   PSA         Hypertension Inadequate control Followed by Cardiology Elevated blood pressure noted during visit. Patient is currently on three blood pressure medications. Upcoming labs No change to meds today. Goal to improve adherence. Continue Amlodipine 10mg , Carvedilol 25mg  TWICE A DAY, Losartan-hydrochlorothiazide 100-25mg   Hyperlipidemia Patient was unable to afford prescribed cholesterol medication (shot) due to high cost. Insurance has since changed, which may affect  coverage. Advised him to contact Cardiology given lack of coverage on repatha Check labs today, based on results will initiate statin therapy next, if cannot get Repatha arranged per Cardiology  Diabetes Type 2 Uncontrolled Patient has been off medication (Rybelsus) due to insurance coverage issues. -Start 30-day sample of Rybelsus 3mg  low dose to restart med -Refer to [facility] case management and pharmacist to work with insurance to find a cost-effective, covered medication for diabetes.   Obesity BMI >39 Patient has gained weight and expressed interest in weight loss options, including bariatric surgery. - WIll address Diabetes and see if option selected will assist with weight management, in addition to encouragement of lifestyle modifications diet exercise  General Health Maintenance -Order comprehensive lab work for Monday, May 12, 2023, including  kidney, liver, and cholesterol panels. -Recommend colon cancer screening with Cologuard home test. -Advise patient to schedule eye exam. -Schedule follow-up appointment in three months.         Orders Placed This Encounter  Procedures   Lipid panel    Standing Status:   Future    Expected Date:   05/12/2023    Expiration Date:   05/08/2024    Has the patient fasted?:   Yes   Hemoglobin A1c    Standing Status:   Future    Expected Date:   05/12/2023    Expiration Date:   05/08/2024   COMPLETE METABOLIC PANEL WITH GFR    Standing Status:   Future    Expected Date:   05/12/2023    Expiration Date:   05/08/2024   CBC with Differential/Platelet    Standing Status:   Future    Expected Date:   05/12/2023    Expiration Date:   05/08/2024   PSA    Standing Status:   Future    Expected Date:   05/12/2023    Expiration Date:   05/08/2024   Microalbumin / creatinine urine ratio    Standing Status:   Future    Expected Date:   05/12/2023    Expiration Date:   05/08/2024   TSH    Standing Status:   Future    Expected Date:   05/12/2023    Expiration Date:   05/08/2024   AMB Referral [facility] Care Management    Referral Priority:   Routine    Referral Type:   Consultation    Referral Reason:   Care Coordination    Number of Visits Requested:   1    No orders of the defined types were placed in this encounter.   Follow up plan: Return in about 3 months (around 08/07/2023) for 3 month DM A1c.  Future labs ordered for  CMET Lipid A1c CBC TSH PSA, Urine Microalbumin  Route chart to Estelle Grumbles [facility] CPP and Charlsie Quest NP Cardiology  Saralyn Pilar, DO [facility] 05/09/2023, 9:21 AM

## 2023-05-09 NOTE — Patient Instructions (Addendum)
Thank you for coming to the office today.  Referral to [facility] case management and pharmacist - Estelle Grumbles  We will work with insurance to find the preferred cost effective option.  We can choose a covered medication for diabetes  Keep on BP medications for now.  Follow up with [facility] for the cholesterol medicine. Let them know you could not get the Repatha due to cost.  DUE for FASTING BLOOD WORK (no food or drink after midnight before the lab appointment, only water or coffee without cream/sugar on the morning of)  SCHEDULE "Lab Only" visit in the morning at the clinic for lab draw  - Make sure Lab Only appointment is at about 1 week before your next appointment, so that results will be available  For Lab Results, once available within 2-3 days of blood draw, you can can log in to MyChart online to view your results and a brief explanation. Also, we can discuss results at next follow-up visit.   Please schedule a Follow-up Appointment to: Return in about 3 months (around 08/07/2023) for 3 month DM A1c.  If you have any other questions or concerns, please feel free to call the office or send a message through MyChart. You may also schedule an earlier appointment if necessary.  Additionally, you may be receiving a survey about your experience at our office within a few days to 1 week by e-mail or mail. We value your feedback.  Saralyn Pilar, DO [facility], [place]

## 2023-05-12 ENCOUNTER — Other Ambulatory Visit: Payer: 59

## 2023-05-12 DIAGNOSIS — E1169 Type 2 diabetes mellitus with other specified complication: Secondary | ICD-10-CM

## 2023-05-12 DIAGNOSIS — Z125 Encounter for screening for malignant neoplasm of prostate: Secondary | ICD-10-CM

## 2023-05-12 DIAGNOSIS — Z0000 Encounter for general adult medical examination without abnormal findings: Secondary | ICD-10-CM

## 2023-05-12 DIAGNOSIS — I2510 Atherosclerotic heart disease of native coronary artery without angina pectoris: Secondary | ICD-10-CM

## 2023-05-13 ENCOUNTER — Telehealth: Payer: Self-pay

## 2023-05-13 LAB — COMPLETE METABOLIC PANEL WITH GFR
AG Ratio: 1.6 (calc) (ref 1.0–2.5)
ALT: 14 U/L (ref 9–46)
AST: 16 U/L (ref 10–40)
Albumin: 4.4 g/dL (ref 3.6–5.1)
Alkaline phosphatase (APISO): 56 U/L (ref 36–130)
BUN: 14 mg/dL (ref 7–25)
CO2: 31 mmol/L (ref 20–32)
Calcium: 9.9 mg/dL (ref 8.6–10.3)
Chloride: 101 mmol/L (ref 98–110)
Creat: 0.98 mg/dL (ref 0.60–1.29)
Globulin: 2.7 g/dL (ref 1.9–3.7)
Glucose, Bld: 145 mg/dL — ABNORMAL HIGH (ref 65–99)
Potassium: 4.6 mmol/L (ref 3.5–5.3)
Sodium: 138 mmol/L (ref 135–146)
Total Bilirubin: 0.5 mg/dL (ref 0.2–1.2)
Total Protein: 7.1 g/dL (ref 6.1–8.1)
eGFR: 95 mL/min/{1.73_m2} (ref >=60)

## 2023-05-13 LAB — MICROALBUMIN / CREATININE URINE RATIO
Creatinine, Urine: 184 mg/dL (ref 20–320)
Microalb Creat Ratio: 21 mg/g{creat} (ref <30)
Microalb, Ur: 3.8 mg/dL

## 2023-05-13 LAB — CBC WITH DIFFERENTIAL/PLATELET
Absolute Lymphocytes: 1283 {cells}/uL (ref 850–3900)
Absolute Monocytes: 281 {cells}/uL (ref 200–950)
Basophils Absolute: 20 {cells}/uL (ref 0–200)
Basophils Relative: 0.5 %
Eosinophils Absolute: 121 {cells}/uL (ref 15–500)
Eosinophils Relative: 3.1 %
HCT: 52.5 % — ABNORMAL HIGH (ref 38.5–50.0)
Hemoglobin: 17.8 g/dL — ABNORMAL HIGH (ref 13.2–17.1)
MCH: 31.5 pg (ref 27.0–33.0)
MCHC: 33.9 g/dL (ref 32.0–36.0)
MCV: 92.9 fL (ref 80.0–100.0)
MPV: 11 fL (ref 7.5–12.5)
Monocytes Relative: 7.2 %
Neutro Abs: 2196 {cells}/uL (ref 1500–7800)
Neutrophils Relative %: 56.3 %
Platelets: 283 10*3/uL (ref 140–400)
RBC: 5.65 10*6/uL (ref 4.20–5.80)
RDW: 12.5 % (ref 11.0–15.0)
Total Lymphocyte: 32.9 %
WBC: 3.9 10*3/uL (ref 3.8–10.8)

## 2023-05-13 LAB — HEMOGLOBIN A1C
Hgb A1c MFr Bld: 7.7 %{Hb} — ABNORMAL HIGH (ref <5.7)
Mean Plasma Glucose: 174 mg/dL
eAG (mmol/L): 9.7 mmol/L

## 2023-05-13 LAB — LIPID PANEL
Cholesterol: 267 mg/dL — ABNORMAL HIGH (ref <200)
HDL: 49 mg/dL (ref >=40)
LDL Cholesterol (Calc): 189 mg/dL — ABNORMAL HIGH
Non-HDL Cholesterol (Calc): 218 mg/dL — ABNORMAL HIGH (ref <130)
Total CHOL/HDL Ratio: 5.4 (calc) — ABNORMAL HIGH (ref <5.0)
Triglycerides: 147 mg/dL (ref <150)

## 2023-05-13 LAB — TSH: TSH: 0.61 m[IU]/L (ref 0.40–4.50)

## 2023-05-13 LAB — PSA: PSA: 0.88 ng/mL (ref <=4.00)

## 2023-05-13 NOTE — Progress Notes (Signed)
Care Guide Pharmacy Note  05/13/2023 Name: [name] MRN: [id number] DOB: 10/04/1975  Referred By: Smitty Cords, DO Reason for referral: Care Coordination (Outreach to schedule with Pharm d )   Jordan Maxwell is a 48 y.o. year old male who is a primary care patient of Smitty Cords, DO.  Saif Kosmicki was referred to the pharmacist for assistance related to: HTN, HLD, and DMII  An unsuccessful telephone outreach was attempted today to contact the patient who was referred to the pharmacy team for assistance with medication assistance. Additional attempts will be made to contact the patient.  Penne Lash , RMA     [facility]  [facility] Guide  Direct Dial: [phone]  Website: [email or web address]

## 2023-05-13 NOTE — Progress Notes (Signed)
Care Guide Pharmacy Note  05/13/2023 Name: Jordan Maxwell MRN: [id number] DOB: 1975/08/23  Referred By: Smitty Cords, DO Reason for referral: Care Coordination (Outreach to schedule with Pharm d )   Jordan Maxwell is a 48 y.o. year old male who is a primary care patient of Smitty Cords, DO.  Jordan Maxwell was referred to the pharmacist for assistance related to: HTN and DMII  Successful contact was made with the patient to discuss pharmacy services including being ready for the pharmacist to call at least 5 minutes before the scheduled appointment time and to have medication bottles and any blood pressure readings ready for review. The patient agreed to meet with the pharmacist via telephone visit on (date/time).06/02/2023  Penne Lash , RMA     [place]  [facility] Guide  Direct Dial: [phone]  Website: [email or web address]

## 2023-05-15 ENCOUNTER — Encounter: Payer: Self-pay | Admitting: Family Medicine

## 2023-05-16 ENCOUNTER — Encounter: Payer: Self-pay | Admitting: Pharmacist

## 2023-05-16 ENCOUNTER — Ambulatory Visit: Payer: 59 | Admitting: Internal Medicine

## 2023-05-16 ENCOUNTER — Other Ambulatory Visit: Payer: 59 | Admitting: Pharmacist

## 2023-05-16 ENCOUNTER — Encounter: Payer: Self-pay | Admitting: Internal Medicine

## 2023-05-16 VITALS — BP 136/80 | HR 99 | Ht 72.0 in | Wt 287.8 lb

## 2023-05-16 DIAGNOSIS — I252 Old myocardial infarction: Secondary | ICD-10-CM

## 2023-05-16 DIAGNOSIS — Z024 Encounter for examination for driving license: Secondary | ICD-10-CM

## 2023-05-16 DIAGNOSIS — E1169 Type 2 diabetes mellitus with other specified complication: Secondary | ICD-10-CM

## 2023-05-16 MED ORDER — RYBELSUS 7 MG PO TABS: ORAL_TABLET | ORAL | 0 refills | Status: DC

## 2023-05-16 NOTE — Progress Notes (Signed)
Commercial Driver Medical Examination   Jordan Maxwell is a 48 y.o. male who presents today for a commercial driver fitness determination physical exam. The patient has a history of HTN, HLD with CAD status post MI, DM 2.  His last A1c was 7.7%, 04/2023.  Review of Systems   Past Medical History:  Diagnosis Date   CAD (coronary artery disease)    a. 07/2015 NSTEMI/Cath: 95% stenosed pRCA s/p PCI/DES, otw angiographically nl cors, LVEF 55-65%, no MS/AS; b. 05/2017 ETT (DOT req): Ex time 7:48, no ST/T changes. HTN response; c. 05/2019 ETT (DOT): Ex time 6:00, no ST/T changes. HTN response.   H/O medication noncompliance    Hyperlipidemia    Hypertension    LVH (left ventricular hypertrophy)    a. echo 07/30/2015: EF 55-65%, no RWMA, LV diastolic fxn nl, LA nl in size, PASP nl; b. 05/2017 Echo: EF 60-65%, no wrma. Nl RV fxn.   NSTEMI (non-ST elevated myocardial infarction) (HCC) 07/30/2015   Statin intolerance    Tobacco abuse     Current Outpatient Medications  Medication Sig Dispense Refill   amLODipine (NORVASC) 10 MG tablet TAKE 1 TABLET BY MOUTH DAILY. (PATIENT NEEDS OFFICE VISIT FOR FURTHER REFILLS) 90 tablet 3   aspirin EC 81 MG tablet Take 1 tablet (81 mg total) by mouth daily. 90 tablet 3   carvedilol (COREG) 25 MG tablet Take 1 tablet (25 mg total) by mouth 2 (two) times daily with a meal. 180 tablet 3   Evolocumab (REPATHA SURECLICK) 140 MG/ML SOAJ Inject 140 mg into the skin every 14 (fourteen) days. 4 mL 5   losartan-hydrochlorothiazide (HYZAAR) 100-25 MG tablet TAKE 1 TABLET BY MOUTH ONCE DAILY. STOP LOSARTAN 90 tablet 3   nitroGLYCERIN (NITROSTAT) 0.4 MG SL tablet Place 1 tablet (0.4 mg total) under the tongue every 5 (five) minutes as needed for chest pain. 25 tablet 3   RYBELSUS 14 MG TABS Take 1 tablet (14 mg total) by mouth daily. 90 tablet 3   No current facility-administered medications for this visit.    Allergies  Allergen Reactions   Egg-Derived Products Anaphylaxis     Incident happened as a child   Statins     Family History  Problem Relation Age of Onset   Hypertension Mother    Diabetes Mother     Social History   Socioeconomic History   Marital status: Married    Spouse name: Not on file   Number of children: 4   Years of education: Not on file   Highest education level: Associate degree: occupational, Scientist, product/process development, or vocational program  Occupational History   Occupation: Truck [employer] doctor    Comment: [employer]  Tobacco Use   Smoking status: Every Day    Current packs/day: 0.50    Average packs/day: 0.5 packs/day for 26.0 years (13.0 ttl pk-yrs)    Types: Cigarettes   Smokeless tobacco: Current    Types: Chew   Tobacco comments:    Pack every two days  Vaping Use   Vaping status: Never Used  Substance and Sexual Activity   Alcohol use: Yes    Comment: "every change I get"   Drug use: Not Currently    Types: Marijuana    Comment: past   Sexual activity: Yes    Partners: Female    Birth control/protection: None  Other Topics Concern   Not on file  Social History Narrative   Not on file   Social Drivers of Corporate investment banker  Strain: Low Risk  (05/09/2023)   Overall Financial Resource Strain (CARDIA)    Difficulty of Paying Living Expenses: Not hard at all  Food Insecurity: No Food Insecurity (05/09/2023)   Hunger Vital Sign    Worried About Running Out of Food in the Last Year: Never true    Ran Out of Food in the Last Year: Never true  Transportation Needs: No Transportation Needs (05/09/2023)   PRAPARE - Administrator, Civil Service (Medical): No    Lack of Transportation (Non-Medical): No  Physical Activity: Insufficiently Active (05/09/2023)   Exercise Vital Sign    Days of Exercise per Week: 1 day    Minutes of Exercise per Session: 10 min  Stress: No Stress Concern Present (05/09/2023)   [employer] of Occupational Health - Occupational Stress Questionnaire    Feeling of Stress : Not at all   Social Connections: Moderately Isolated (05/09/2023)   Social Connection and Isolation Panel [NHANES]    Frequency of Communication with Friends and Family: More than three times a week    Frequency of Social Gatherings with Friends and Family: More than three times a week    Attends Religious Services: Never    Database administrator or Organizations: No    Attends Engineer, structural: Not on file    Marital Status: Married  Catering manager Violence: Not on file     Constitutional: Denies fever, malaise, fatigue, headache or abrupt weight changes.  HEENT: Denies eye pain, eye redness, ear pain, ringing in the ears, wax buildup, runny nose, nasal congestion, bloody nose, or sore throat. Respiratory: Denies difficulty breathing, shortness of breath, cough or sputum production.   Cardiovascular: Denies chest pain, chest tightness, palpitations or swelling in the hands or feet.  Gastrointestinal: Denies abdominal pain, bloating, constipation, diarrhea or blood in the stool.  GU: Denies urgency, frequency, pain with urination, burning sensation, blood in urine, odor or discharge. Musculoskeletal: Denies decrease in range of motion, difficulty with gait, muscle pain or joint pain and swelling.  Skin: Denies redness, rashes, lesions or ulcercations.  Neurological: Denies dizziness, difficulty with memory, difficulty with speech or problems with balance and coordination.  Psych: Denies anxiety, depression, SI/HI.  No other specific complaints in a complete review of systems (except as listed in HPI above).   Objective:    Vision:  Uncorrected Corrected Horizontal Field of Vision  Right Eye 20/20  70 degrees  Left Eye  20/20  70 degrees  Both Eyes  20/20     Applicant can recognize and distinguish among traffic control signals and devices showing standard red, green, and amber colors.   Monocular Vision?: No   Hearing:    Whisper Test:  Right Ear: > 5 feet  Left Ear: >  69ffeet   No hearing aid requirement  BP (!) 142/88 (BP Location: Left Arm, Patient Position: Sitting, Cuff Size: Large)   Pulse 99   Ht 6' (1.829 m)   Wt 287 lb 12.8 oz (130.5 kg)   SpO2 98%   BMI 39.03 kg/m    Repeat Blood Pressure: 136/80  Wt Readings from Last 3 Encounters:  05/09/23 288 lb (130.6 kg)  10/04/22 274 lb (124.3 kg)  11/02/21 263 lb (119.3 kg)    General: Appears his stated age, obese, in NAD. HEENT: Head: normal shape and size; Eyes: sclera white, no icterus, conjunctiva pink, PERRLA and EOMs intact; Ears: Tm's gray and intact, normal light reflex;  Cardiovascular: Normal rate and  rhythm. S1,S2 noted.  No murmur, rubs or gallops noted. No JVD or BLE edema. Pulmonary/Chest: Normal effort and positive vesicular breath sounds. No respiratory distress. No wheezes, rales or ronchi noted.  Abdomen: Soft and nontender. Normal bowel sounds. No distention or masses noted.  Musculoskeletal: Normal range of motion. Strength 5/5 BUE/BLE. No difficulty with gait.  Neurological: Alert and oriented. Cranial nerves II-XII grossly intact. Coordination normal.  Psychiatric: Mood and affect normal. Behavior is normal. Judgment and thought content normal.    BMET    Component Value Date/Time   NA 138 05/12/2023 0810   NA 139 03/27/2021 0816   NA 135 (L) 11/04/2013 1046   K 4.6 05/12/2023 0810   K 3.8 11/04/2013 1046   CL 101 05/12/2023 0810   CL 103 11/04/2013 1046   CO2 31 05/12/2023 0810   CO2 25 11/04/2013 1046   GLUCOSE 145 (H) 05/12/2023 0810   GLUCOSE 90 11/04/2013 1046   BUN 14 05/12/2023 0810   BUN 16 03/27/2021 0816   BUN 12 11/04/2013 1046   CREATININE 0.98 05/12/2023 0810   CALCIUM 9.9 05/12/2023 0810   CALCIUM 9.1 11/04/2013 1046   GFRNONAA >60 11/01/2022 0851   GFRNONAA 88 01/20/2020 0755   GFRAA 102 01/20/2020 0755    Lipid Panel     Component Value Date/Time   CHOL 267 (H) 05/12/2023 0810   CHOL 178 06/05/2021 0816   TRIG 147 05/12/2023 0810    HDL 49 05/12/2023 0810   HDL 39 (L) 06/05/2021 0816   CHOLHDL 5.4 (H) 05/12/2023 0810   VLDL 19 11/01/2022 0851   LDLCALC 189 (H) 05/12/2023 0810    CBC    Component Value Date/Time   WBC 3.9 05/12/2023 0810   RBC 5.65 05/12/2023 0810   HGB 17.8 (H) 05/12/2023 0810   HGB 17.1 08/10/2015 1420   HCT 52.5 (H) 05/12/2023 0810   HCT 49.3 08/10/2015 1420   PLT 283 05/12/2023 0810   PLT 354 08/10/2015 1420   MCV 92.9 05/12/2023 0810   MCV 91 08/10/2015 1420   MCV 95 11/04/2013 1046   MCH 31.5 05/12/2023 0810   MCHC 33.9 05/12/2023 0810   RDW 12.5 05/12/2023 0810   RDW 13.9 08/10/2015 1420   RDW 13.6 11/04/2013 1046   LYMPHSABS 1,337 01/20/2020 0755   LYMPHSABS 1.7 08/10/2015 1420   MONOABS 0.5 02/13/2016 0729   EOSABS 121 05/12/2023 0810   EOSABS 0.1 08/10/2015 1420   BASOSABS 20 05/12/2023 0810   BASOSABS 0.0 08/10/2015 1420    Hgb A1C Lab Results  Component Value Date   HGBA1C 7.7 (H) 05/12/2023      Labs:  Urinalysis:  Specific Gravity: 1.010 Protein: Neg, Glucose: Neg, Blood: Neg  Assessment:    Healthy male exam.  Meets standards, but periodic monitoring required due to DM 2.  Driver qualified only for 1 year.    Plan:    Medical examiners certificate completed and printed. Return as needed.    Nicki Reaper, NP

## 2023-05-16 NOTE — Patient Instructions (Signed)
Goals Addressed             This Visit's Progress    Pharmacy Goals       Repatha is use to lower LDL (bad cholesterol)   The device is a single-dose disposable pen. This pen can only be used for 1 single injection, and must be discarded after use   The medicine is injected under your skin and can be given by yourself or someone else (caregiver)   The most common side effects of Repatha include side effects of runny nose, sore throat, symptoms of the common cold, flu or flu-like symptoms, back pain and redness, pain, or bruising at the injection site   Tell your healthcare provider if you have any side effect that bothers you or that does not go away.    Stop taking Repatha and call your healthcare provider or seek emergency help right away if you have any of these symptoms: trouble breathing or swallowing, raised bumps (hives), rash or itching, swelling of the face, lips, tongue, throat or arms   In the meantime, if you would like to review the video from the following How to Use video from the manufacturer, it can be found at:   https://www.schwartz.org/   The following is the link for the copay savings card for Rybelsus:   https://www.rybelsus.com/savings-and-support.html     Thank you!   Estelle Grumbles, PharmD, [place], CPP Clinical Pharmacist [facility] [phone]

## 2023-05-16 NOTE — Progress Notes (Signed)
05/16/2023 Name: Jordan Maxwell MRN: [id number] DOB: 01/20/1976  Chief Complaint  Patient presents with   Medication Assistance   Medication Management    Jordan Maxwell is a 48 y.o. year old male who presented for a telephone visit.   They were referred to the pharmacist by their PCP for assistance in managing diabetes, hyperlipidemia, and medication access.    Subjective:  Care Team: Primary Care Provider: Smitty Cords, DO ; Next Scheduled Visit: 08/08/2023 Cardiologist: Iran Ouch, MD  Medication Access/Adherence  Current Pharmacy:  [facility] [address] ([address]), [place] - [address] [place]) [place] [address] Phone: [phone] Fax: [phone]   Patient reports affordability concerns with their medications: Yes  Patient reports access/transportation concerns to their pharmacy: No  Patient reports adherence concerns with their medications:  No    Patient requesting assistance with cost of Rybelsus and Repatha through his [facility] prescription coverage - Reports previously on Repatha Sureclick injection, but stopped as cost became unaffordable  Diabetes:  Current medications:  - Rybelsus 3 mg daily (from sample provided by PCP on 05/09/2023) Confirms taking  on an empty stomach, >=30 minutes before the first food, beverage, or other oral medications of the day with <=4 oz of plain water only Reports tolerating well  Denies monitoring home blood sugar   Hyperlipidemia/ASCVD Risk Reduction  Current lipid lowering medications: none Medications tried in the past: statin, ezetimibe; Repatha (cost)  Antiplatelet regimen: aspirin 81 mg daily  ASCVD History: coronary artery disease with NSTEMI in 07/2015 status post PCI/DES to the RCA     Objective:  Lab Results  Component Value Date   HGBA1C 7.7 (H) 05/12/2023    Lab Results  Component Value Date   CREATININE 0.98 05/12/2023    BUN 14 05/12/2023   NA 138 05/12/2023   K 4.6 05/12/2023   CL 101 05/12/2023   CO2 31 05/12/2023    Lab Results  Component Value Date   CHOL 267 (H) 05/12/2023   HDL 49 05/12/2023   LDLCALC 189 (H) 05/12/2023   TRIG 147 05/12/2023   CHOLHDL 5.4 (H) 05/12/2023    Current Outpatient Medications on File Prior to Visit  Medication Sig Dispense Refill   amLODipine (NORVASC) 10 MG tablet TAKE 1 TABLET BY MOUTH DAILY. (PATIENT NEEDS OFFICE VISIT FOR FURTHER REFILLS) 90 tablet 3   aspirin EC 81 MG tablet Take 1 tablet (81 mg total) by mouth daily. 90 tablet 3   carvedilol (COREG) 25 MG tablet Take 1 tablet (25 mg total) by mouth 2 (two) times daily with a meal. 180 tablet 3   Evolocumab (REPATHA SURECLICK) 140 MG/ML SOAJ Inject 140 mg into the skin every 14 (fourteen) days. 4 mL 5   losartan-hydrochlorothiazide (HYZAAR) 100-25 MG tablet TAKE 1 TABLET BY MOUTH ONCE DAILY. STOP LOSARTAN 90 tablet 3   nitroGLYCERIN (NITROSTAT) 0.4 MG SL tablet Place 1 tablet (0.4 mg total) under the tongue every 5 (five) minutes as needed for chest pain. 25 tablet 3   RYBELSUS 14 MG TABS Take 1 tablet (14 mg total) by mouth daily. 90 tablet 3   No current facility-administered medications on file prior to visit.      Assessment/Plan:   Unable to review medications today; will plan to complete comprehensive review during upcoming telephone appointment  From review of [facility] plan website, note both Rybelsus and Repatha appear to be covered tier 2 options through patient's plan,  both requiring prior authorization - Submit PA request for both Rybelsus and Repatha via covermymeds on behalf of patient today  PA for both medications approved  CPP sends prescription for Rybelsus 7 mg to pharmacy for patient. Discuss with patient to not start Rybelsus 7 mg daily prescription until has completed 30 days of Rybelsus 3 mg daily  Follow up with [facility] regarding cost/provide copay savings  card information from manufacturers:   Rybelsus Savings Card: RxBin: [id number] RxPCN: [id number] RxGrp: [id number] ID: [id number]  Repatha Savings Card: RxBin: [id number] RxPCN: [id number] RxGrp: [id number] ID: [id number]  [facility] advises cost for Repatha is $15/month and Rybelsus is $14.99/month   Follow Up Plan: Clinical Pharmacist will follow up with patient by telephone on 06/20/2023 at 9:00 AM   Estelle Grumbles, PharmD, [facility] [phone]

## 2023-05-19 ENCOUNTER — Ambulatory Visit: Payer: 59 | Admitting: Internal Medicine

## 2023-05-19 ENCOUNTER — Telehealth: Payer: Self-pay | Admitting: Pharmacy Technician

## 2023-05-19 NOTE — Telephone Encounter (Signed)
Pharmacy Patient Advocate Encounter  Received notification from [facility] that Prior Authorization for repatha has been APPROVED from 05/16/23 to 05/15/24   PA #/Case ID/Reference #: [id number]

## 2023-06-02 ENCOUNTER — Other Ambulatory Visit: Payer: 59

## 2023-06-20 ENCOUNTER — Other Ambulatory Visit (INDEPENDENT_AMBULATORY_CARE_PROVIDER_SITE_OTHER): Payer: Self-pay | Admitting: Pharmacist

## 2023-06-20 DIAGNOSIS — I10 Essential (primary) hypertension: Secondary | ICD-10-CM

## 2023-06-20 DIAGNOSIS — E119 Type 2 diabetes mellitus without complications: Secondary | ICD-10-CM

## 2023-06-20 DIAGNOSIS — E1169 Type 2 diabetes mellitus with other specified complication: Secondary | ICD-10-CM

## 2023-06-20 DIAGNOSIS — Z7984 Long term (current) use of oral hypoglycemic drugs: Secondary | ICD-10-CM

## 2023-06-20 MED ORDER — RYBELSUS 14 MG PO TABS: ORAL_TABLET | ORAL | 2 refills | Status: DC

## 2023-06-20 NOTE — Progress Notes (Signed)
 06/20/2023 Name: Jordan Maxwell MRN: [id number] DOB: 1976-01-05  Chief Complaint  Patient presents with   Medication Management   Medication Adherence    Jordan Maxwell is a 48 y.o. year old male who presented for a telephone visit.   They were referred to the pharmacist by their PCP for assistance in managing diabetes, hyperlipidemia, and medication access.      Subjective:   Care Team: Primary Care Provider: Smitty Cords, DO ; Next Scheduled Visit: 08/08/2023 Cardiologist: Iran Ouch, MD  Medication Access/Adherence  Current Pharmacy:  [facility] [address] ([address]), [place] - [address] [place]) [place] [address] Phone: [phone] Fax: [phone]   Patient reports affordability concerns with their medications: No Patient reports access/transportation concerns to their pharmacy: No  Patient reports adherence concerns with their medications:  No     Confirms Repatha and Rybelsus both now affordable   Diabetes:   Current medications:  - Rybelsus 7 mg daily Confirms taking  on an empty stomach, >=30 minutes before the first food, beverage, or other oral medications of the day with <=4 oz of plain water only Reports tolerating well and requests to increase dose back to Rybelsus 14 mg daily   Denies monitoring home blood sugar; denies interest in starting to monitor at this time   Current medication access support: Rybelsus manufacturer savings card    Hyperlipidemia/ASCVD Risk Reduction   Current lipid lowering medications: Repatha Sureclick 140 mg every 14 days       Restarted ~05/17/2023. Reports tolerating well  Medications tried in the past: statin, ezetimibe   Antiplatelet regimen: aspirin 81 mg daily   ASCVD History: coronary artery disease with NSTEMI in 07/2015 status post PCI/DES to the RCA   Current medication access support: Repatha manufacturer savings  card   Hypertension:  Current medications:  - amlodipine 10 mg daily - carvedilol 25 mg twice daily - losartan-HCTZ 100-25 mg daily   Patient has an automated, upper arm home BP cuff, but denies checking home BP recently  Patient denies hypotensive s/sx including dizziness, lightheadedness.   Denies adding salt to his food    Objective:  Lab Results  Component Value Date   HGBA1C 7.7 (H) 05/12/2023    Lab Results  Component Value Date   CREATININE 0.98 05/12/2023   BUN 14 05/12/2023   NA 138 05/12/2023   K 4.6 05/12/2023   CL 101 05/12/2023   CO2 31 05/12/2023    Lab Results  Component Value Date   CHOL 267 (H) 05/12/2023   HDL 49 05/12/2023   LDLCALC 189 (H) 05/12/2023   TRIG 147 05/12/2023   CHOLHDL 5.4 (H) 05/12/2023   BP Readings from Last 3 Encounters:  05/16/23 136/80  05/09/23 (!) 148/90  10/04/22 126/74   Pulse Readings from Last 3 Encounters:  05/16/23 99  05/09/23 87  10/04/22 79     Medications Reviewed Today     Reviewed by Manuela Neptune, RPH-CPP (Pharmacist) on 06/20/23 at 0914  Med List Status: <None>   Medication Order Taking? Sig Documenting Provider Last Dose Status Informant  amLODipine (NORVASC) 10 MG tablet [id number]  TAKE 1 TABLET BY MOUTH DAILY. (PATIENT NEEDS OFFICE VISIT FOR FURTHER REFILLS) Charlsie Quest, NP  Active   aspirin EC 81 MG tablet [id number]  Take 1 tablet (81 mg total) by mouth daily. Charlsie Quest, NP  Active   carvedilol (COREG) 25 MG tablet [id number]  Take 1 tablet (25 mg  total) by mouth 2 (two) times daily with a meal. Hammock, Sheri, NP  Active   Evolocumab (REPATHA SURECLICK) 140 MG/ML SOAJ [id number] Yes Inject 140 mg into the skin every 14 (fourteen) days. Charlsie Quest, NP Taking Active   losartan-hydrochlorothiazide (HYZAAR) 100-25 MG tablet [id number]  TAKE 1 TABLET BY MOUTH ONCE DAILY. STOP Theresia Majors, NP  Active   nitroGLYCERIN (NITROSTAT) 0.4 MG SL tablet [id number]  Place 1 tablet  (0.4 mg total) under the tongue every 5 (five) minutes as needed for chest pain. Charlsie Quest, NP  Active   Semaglutide (RYBELSUS) 7 MG TABS [id number] Yes Take 1 tablet by mouth at least 30 minutes before any food, beverage, or other oral medications with no more than 4 ounces of plain water only. Smitty Cords, DO Taking Active               Assessment/Plan:   Diabetes: - Reviewed long term cardiovascular and renal outcomes of uncontrolled blood sugar - Advise patient may increase Rybelsus dose to 14 mg daily   CPP sends prescription for Rybelsus 14 mg daily to pharmacy     Hyperlipidemia/ASCVD Risk Reduction: - Have reviewed long term complications of uncontrolled cholesterol - Recommend patient to continue to take Repatha as directed   Hypertension: - Reviewed long term cardiovascular and renal outcomes of uncontrolled blood pressure - Reviewed appropriate blood pressure monitoring technique and reviewed goal blood pressure. - Recommend to monitor home blood pressure, keep log of results and have this record to review at upcoming medical appointments. Patient to contact provider office sooner if needed for readings outside of established parameters or symptoms   Follow Up Plan:   Patient denies further medication questions or concerns today Provide patient with contact information for clinic pharmacist to contact if needed in future for medication questions/concerns   Estelle Grumbles, PharmD, [place]y [place], CPP Clinical Pharmacist [facility] [phone]

## 2023-06-20 NOTE — Patient Instructions (Signed)
 Check your blood pressure once weekly, and any time you have concerning symptoms like headache, chest pain, dizziness, shortness of breath, or vision changes.   Our goal is less than 130/80.  To appropriately check your blood pressure, make sure you do the following:  1) Avoid caffeine, exercise, or tobacco products for 30 minutes before checking. Empty your bladder. 2) Sit with your back supported in a flat-backed chair. Rest your arm on something flat (arm of the chair, table, etc). 3) Sit still with your feet flat on the floor, resting, for at least 5 minutes.  4) Check your blood pressure. Take 1-2 readings.  5) Write down these readings and bring with you to any provider appointments.  Bring your home blood pressure machine with you to a provider's office for accuracy comparison at least once a year.   Make sure you take your blood pressure medications before you come to any office visit, even if you were asked to fast for labs.  Estelle Grumbles, PharmD, [place]y [place], CPP Clinical Pharmacist [facility] [phone]

## 2023-08-08 ENCOUNTER — Ambulatory Visit: Payer: Self-pay | Admitting: Family Medicine

## 2023-08-12 ENCOUNTER — Encounter: Payer: Self-pay | Admitting: Family Medicine

## 2023-08-12 ENCOUNTER — Ambulatory Visit: Payer: Self-pay | Admitting: Family Medicine

## 2023-08-12 VITALS — BP 130/86 | HR 94 | Resp 19 | Ht 72.0 in | Wt 278.6 lb

## 2023-08-12 DIAGNOSIS — I10 Essential (primary) hypertension: Secondary | ICD-10-CM

## 2023-08-12 DIAGNOSIS — E1169 Type 2 diabetes mellitus with other specified complication: Secondary | ICD-10-CM

## 2023-08-12 DIAGNOSIS — E785 Hyperlipidemia, unspecified: Secondary | ICD-10-CM

## 2023-08-12 DIAGNOSIS — E119 Type 2 diabetes mellitus without complications: Secondary | ICD-10-CM

## 2023-08-12 DIAGNOSIS — I2510 Atherosclerotic heart disease of native coronary artery without angina pectoris: Secondary | ICD-10-CM

## 2023-08-12 DIAGNOSIS — Z7984 Long term (current) use of oral hypoglycemic drugs: Secondary | ICD-10-CM

## 2023-08-12 LAB — POCT GLYCOSYLATED HEMOGLOBIN (HGB A1C): Hemoglobin A1C: 5.9 % — AB (ref 4.0–5.6)

## 2023-08-12 NOTE — Progress Notes (Signed)
 Subjective:    Patient ID: Jordan Maxwell, male    DOB: 07/27/1975, 48 y.o.   MRN: [id number]  Jordan Maxwell is a 48 y.o. male presenting on 08/12/2023 for Diabetes and Hypertension   HPI  Discussed the use of AI scribe software for clinical note transcription with the patient, who gave verbal consent to proceed.  History of Present Illness    Jordan Maxwell is a 48 year old male with type 2 diabetes mellitus who presents for follow-up on blood sugar control and weight management.  Type 2 Diabetes Hyperlipidemia Coronary Artery Disease CAD Hypertension Morbid Obesity   He is following up on his blood sugar control, with a recent hemoglobin A1c of 5.9%, improved from 7.7% three months ago. He attributes this improvement to continued use of Rybelsus  14 mg daily.   He has difficulty with weight loss despite some progress, having lost nine pounds over the past three months, from 287 to 278 pounds. He wants to lose more weight, ideally reaching 215 pounds, and is considering non-surgical options such as a gastric balloon and switching to a weight loss medication similar to what his wife is using. He has not tried diabetes shots but is aware of their potential benefits for weight loss, as his wife has recently started on them. - He has been persistent with diet restriction regimen and exercise without success over past >6 months overall, some weight loss.  Blood pressure has been controlled on current regimen. Needs refills, on Carvedilol  25mg  TWICE A DAY, Amlodipine  10mg  daily, Losartan -hydrochlorothiazide  100-25mg  daily  For cholesterol he is on Repatha  injection, has not had repeat panel since. His last LDL 189       08/12/2023   40:98 AM 05/09/2023    8:53 AM 08/31/2021    8:23 AM  Depression screen PHQ 2/9  Decreased Interest 0 0 0  Down, Depressed, Hopeless 0 0 0  PHQ - 2 Score 0 0 0  Altered sleeping 0  0  Tired, decreased energy 1  0  Change in appetite 0  0  Feeling bad or  failure about yourself  0  0  Trouble concentrating 0  0  Moving slowly or fidgety/restless 0  0  Suicidal thoughts 0  0  PHQ-9 Score 1  0  Difficult doing work/chores Not difficult at all  Not difficult at all       08/12/2023   10:48 AM 05/09/2023    8:53 AM 08/31/2021    8:23 AM  GAD 7 : Generalized Anxiety Score  Nervous, Anxious, on Edge 0 0 0  Control/stop worrying 0 0 0  Worry too much - different things 0 0 0  Trouble relaxing 0 0 0  Restless 0 0 0  Easily annoyed or irritable 0 0 0  Afraid - awful might happen 0 0 0  Total GAD 7 Score 0 0 0  Anxiety Difficulty Not difficult at all  Not difficult at all    Social History   Tobacco Use   Smoking status: Every Day    Current packs/day: 0.50    Average packs/day: 0.5 packs/day for 26.0 years (13.0 ttl pk-yrs)    Types: Cigarettes   Smokeless tobacco: Current    Types: Chew   Tobacco comments:    Pack every two days  Vaping Use   Vaping status: Never Used  Substance Use Topics   Alcohol use: Yes    Comment: "every change I get"   Drug use: Not Currently  Types: Marijuana    Comment: past    Review of Systems Per HPI unless specifically indicated above     Objective:    BP 130/86 (BP Location: Left Arm, Patient Position: Standing;Sitting, Cuff Size: Large)   Pulse 94   Resp 19   Ht 6' (1.829 m)   Wt 278 lb 9.6 oz (126.4 kg)   SpO2 97%   BMI 37.78 kg/m   Wt Readings from Last 3 Encounters:  08/12/23 278 lb 9.6 oz (126.4 kg)  05/16/23 287 lb 12.8 oz (130.5 kg)  05/09/23 288 lb (130.6 kg)    Physical Exam Vitals and nursing note reviewed.  Constitutional:      General: He is not in acute distress.    Appearance: Normal appearance. He is well-developed. He is obese. He is not diaphoretic.     Comments: Well-appearing, comfortable, cooperative  HENT:     Head: Normocephalic and atraumatic.  Eyes:     General:        Right eye: No discharge.        Left eye: No discharge.      Conjunctiva/sclera: Conjunctivae normal.  Cardiovascular:     Rate and Rhythm: Normal rate.  Pulmonary:     Effort: Pulmonary effort is normal.  Skin:    General: Skin is warm and dry.     Findings: No erythema or rash.  Neurological:     Mental Status: He is alert and oriented to person, place, and time.  Psychiatric:        Mood and Affect: Mood normal.        Behavior: Behavior normal.        Thought Content: Thought content normal.     Comments: Well groomed, good eye contact, normal speech and thoughts     Results for orders placed or performed in visit on 08/12/23  POCT glycosylated hemoglobin (Hb A1C)   Collection Time: 08/12/23 10:52 AM  Result Value Ref Range   Hemoglobin A1C 5.9 (A) 4.0 - 5.6 %   HbA1c POC (<> result, manual entry)     HbA1c, POC (prediabetic range)     HbA1c, POC (controlled diabetic range)        Assessment & Plan:   Problem List Items Addressed This Visit     CAD (coronary artery disease)   Essential hypertension   Hyperlipidemia associated with type 2 diabetes mellitus (HCC)   Morbid obesity (HCC)   Type 2 diabetes mellitus with other specified complication (HCC) - Primary   Relevant Orders   POCT glycosylated hemoglobin (Hb A1C) (Completed)   Other Visit Diagnoses       Diabetes mellitus treated with oral medication (HCC)           Mobird Obesity BMI >37 Weight reduced to 278 lbs from 287 lbs. Ideal weight 215 lbs.  Prefers non-surgical options. Discussed bariatric and non-surgical balloon procedures. Considered insurance coverage.  Discussed switch from oral GLP Rybelsus  to Injectable GLP/GIP Mounjaro  for weight loss and diabetes control.  - Provide list of bariatric centers for consultation, including [facility] in [place]. - Check insurance coverage for bariatric procedures. - Consider non-surgical balloon procedure for weight loss. Contact us  back for referral if interested  Type 2 Diabetes Mellitus A1c  improved to 5.9 from 7.7 - Consider switching to off Rybelsus  14mg  daily to Mounjaro  5 mg weekly for weight loss if insurance covers and he decides to proceed. - Monitor A1c and glycemic control.  Due DM Eye  exam     Hyperlipidemia CAD Coronary Artery Disease Last LDL 04/2023 with 189 result On Repatha  q 2 week injection Followed by Cardiology  Consider Coronary Calcium  Score CT however he already has had Stress testing previously by Cardiology    Orders Placed This Encounter  Procedures   POCT glycosylated hemoglobin (Hb A1C)    No orders of the defined types were placed in this encounter.   Follow up plan: Return if symptoms worsen or fail to improve.  Anticipate 3 months after any change to his medicine if he prefers to switch to Mounjaro, otherwise if needs referral he does not have to follow up until 6 months.  Domingo Friend, DO [facility] 08/12/2023, 10:55 AM

## 2023-08-12 NOTE — Patient Instructions (Addendum)
 Thank you for coming to the office today.  Great job. A1c down to 5.9 and weight loss 9 lbs in past 3 months.  Recent Labs    05/12/23 0810 08/12/23 1052  HGBA1C 7.7* 5.9*   We can consider switching off of Rybelsus  14mg  on to Mounjaro 5mg  weekly when or if ready in the future.  Once we make a plan to switch we can see you back in 3 months, contact us  to follow-up  --------------------------------  Please check into availability and coverage from insurance for Bariatric Surgery consultation.  I agree prefer a non surgical balloon option if available.  [facility] ([facility])  [address] [place], [place] [address] Phone [phone] Fax [phone]  --------------------  [facility]  Earlean Glaze, MD [facility], [facility] [address] [place], [place] [address] Office: [phone]  [facility] [address] [place], [place] [address] Office: [phone]  Fax: [phone]   Please schedule a Follow-up Appointment to: Return if symptoms worsen or fail to improve.  If you have any other questions or concerns, please feel free to call the office or send a message through MyChart. You may also schedule an earlier appointment if necessary.  Additionally, you may be receiving a survey about your experience at our office within a few days to 1 week by e-mail or mail. We value your feedback.  Domingo Friend, DO [facility], [place]

## 2023-08-13 ENCOUNTER — Telehealth: Payer: Self-pay

## 2023-08-13 MED ORDER — MOUNJARO 5 MG/0.5ML ~~LOC~~ SOAJ: 5.0000 mg | SUBCUTANEOUS | 0 refills | Status: DC

## 2023-08-13 NOTE — Telephone Encounter (Signed)
 Jacorion Arno (Key: [id number]) Rx #: [id number] Mounjaro 5MG /0.5ML auto-injectors Form [employer] Electronic PA Form [phone] NCPDP) Created 4 hours ago Sent to Plan 4 minutes ago Determination Wait for Questions [employer] NCPDP 2017 typically responds with questions in less than 15 minutes, but may take up to 24 hours.

## 2023-08-14 NOTE — Telephone Encounter (Signed)
 Jordan Maxwell (Key: [id number]) Rx #: [id number] Mounjaro 5MG /0.5ML auto-injectors Form [employer] Electronic PA Form (2017 NCPDP) Created 1 day ago Sent to Plan 22 hours ago Plan Response 19 hours ago Submit Clinical Questions 1 minute ago Determination Wait for Determination Please wait for [employer] NCPDP 2017 to return a determination.

## 2023-08-14 NOTE — Telephone Encounter (Signed)
 Jordan Maxwell (Key: [id number]) Rx #: [id number] Mounjaro 5MG /0.5ML auto-injectors Form [employer] Electronic PA Form (2017 NCPDP) Created 1 day ago Sent to Plan 22 hours ago Plan Response 20 hours ago Submit Clinical Questions 15 minutes ago Determination Favorable 7 minutes ago Message from [employer] Your PA request has been approved. Additional information will be provided in the approval communication. (Message 1145). Authorization Expiration Date: August 14, 2026.

## 2023-09-06 ENCOUNTER — Other Ambulatory Visit: Payer: Self-pay | Admitting: Family Medicine

## 2023-09-06 DIAGNOSIS — E1169 Type 2 diabetes mellitus with other specified complication: Secondary | ICD-10-CM

## 2023-09-09 NOTE — Telephone Encounter (Signed)
 Requested medication (s) are due for refill today: yes  Requested medication (s) are on the active medication list: yes  Last refill:  08/13/23  Future visit scheduled: no  Notes to clinic:  Medication not assigned to a protocol, review manually.      Requested Prescriptions  Pending Prescriptions Disp Refills   MOUNJARO 5 MG/0.5ML Pen [Pharmacy Med Name: Mounjaro 5 MG/0.5ML Subcutaneous Solution Pen-injector] 4 mL 0    Sig: INJECT 5 MG SUBCUTANEOUSLY  ONCE A WEEK     Off-Protocol Failed - 09/09/2023 11:59 AM      Failed - Medication not assigned to a protocol, review manually.      Passed - Valid encounter within last 12 months    Recent Outpatient Visits           4 weeks ago Type 2 diabetes mellitus with other specified complication, without long-term current use of insulin [facility])   [facility] [place], [place], [place]

## 2023-10-05 ENCOUNTER — Other Ambulatory Visit: Payer: Self-pay | Admitting: Cardiology

## 2023-10-10 ENCOUNTER — Other Ambulatory Visit: Payer: Self-pay | Admitting: Cardiology

## 2023-11-04 ENCOUNTER — Other Ambulatory Visit: Payer: Self-pay | Admitting: Cardiovascular Disease

## 2023-11-06 ENCOUNTER — Encounter: Payer: Self-pay | Admitting: Family Medicine

## 2023-11-06 DIAGNOSIS — E1169 Type 2 diabetes mellitus with other specified complication: Secondary | ICD-10-CM

## 2023-11-06 MED ORDER — MOUNJARO 5 MG/0.5ML ~~LOC~~ SOAJ: 5.0000 mg | SUBCUTANEOUS | 2 refills | Status: DC

## 2023-11-20 MED ORDER — TIRZEPATIDE 7.5 MG/0.5ML ~~LOC~~ SOAJ: 7.5000 mg | SUBCUTANEOUS | 0 refills | Status: DC

## 2023-11-20 NOTE — Addendum Note (Signed)
 Addended by: ANTONETTE ANGELINE ORN on: 11/20/2023 10:55 AM   Modules accepted: Orders

## 2023-12-03 ENCOUNTER — Telehealth: Payer: Self-pay | Admitting: Pharmacy Technician

## 2023-12-03 ENCOUNTER — Encounter: Payer: Self-pay | Admitting: Cardiology

## 2023-12-03 ENCOUNTER — Other Ambulatory Visit: Payer: Self-pay

## 2023-12-03 ENCOUNTER — Ambulatory Visit: Payer: Self-pay | Attending: Cardiology | Admitting: Cardiology

## 2023-12-03 ENCOUNTER — Other Ambulatory Visit (HOSPITAL_COMMUNITY): Payer: Self-pay

## 2023-12-03 VITALS — BP 130/90 | HR 93 | Ht 72.0 in | Wt 274.0 lb

## 2023-12-03 DIAGNOSIS — I2510 Atherosclerotic heart disease of native coronary artery without angina pectoris: Secondary | ICD-10-CM | POA: Diagnosis not present

## 2023-12-03 DIAGNOSIS — I10 Essential (primary) hypertension: Secondary | ICD-10-CM

## 2023-12-03 DIAGNOSIS — E785 Hyperlipidemia, unspecified: Secondary | ICD-10-CM

## 2023-12-03 DIAGNOSIS — Z720 Tobacco use: Secondary | ICD-10-CM | POA: Diagnosis not present

## 2023-12-03 DIAGNOSIS — F1010 Alcohol abuse, uncomplicated: Secondary | ICD-10-CM

## 2023-12-03 MED ORDER — AMLODIPINE BESYLATE 10 MG PO TABS: 10.0000 mg | ORAL_TABLET | Freq: Every day | ORAL | 3 refills | Status: AC

## 2023-12-03 MED ORDER — REPATHA SURECLICK 140 MG/ML ~~LOC~~ SOAJ: SUBCUTANEOUS | 3 refills | Status: DC

## 2023-12-03 MED ORDER — LOSARTAN POTASSIUM-HCTZ 100-25 MG PO TABS: ORAL_TABLET | ORAL | 3 refills | Status: AC

## 2023-12-03 MED ORDER — NITROGLYCERIN 0.4 MG SL SUBL: 0.4000 mg | SUBLINGUAL_TABLET | SUBLINGUAL | 3 refills | Status: AC | PRN

## 2023-12-03 MED ORDER — CARVEDILOL 25 MG PO TABS: 25.0000 mg | ORAL_TABLET | Freq: Two times a day (BID) | ORAL | 3 refills | Status: AC

## 2023-12-03 NOTE — Progress Notes (Signed)
 Cardiology Office Note   Date:  12/03/2023  ID:  Jordan Maxwell, DOB 1976-01-17, MRN [id number] PCP: Edman Marsa PARAS, DO  [facility] Providers Cardiologist:  Deatrice Cage, MD     History of Present Illness Jordan Maxwell is a 48 y.o. male with a past medical history of coronary artery disease with NSTEMI in (07/2015) is a 32 DES to the RCA, type 2 diabetes, hypertension, hyperlipidemia with statin and ezetimibe  intolerance, obesity, tobacco use, who presents today for follow-up of his coronary artery disease.   He had presented to the hospital 07/2015 with an NSTEMI.  Left heart catheterization showed 95% proximal RCA stenosis with otherwise nonobstructive disease.  He underwent successful PCI/DES to the RCA.  Echo at that time demonstrated an EF of 55 to 60%, mild LVH, no regional wall motion abnormalities, normal LV systolic function.  Over the years he has undergone ETT per DOT guidelines since his MI most recently in 12/22 which was normal with good exercise capacity.  It was noted previously done well on Repatha  though stopped this medication due to concerns regarding worsening glucose with recommendations to resume medication and subsequently transitioned to Praluent  secondary to insurance reduction of Repatha .   He was last seen in clinic 11/02/2021 by R. Dunn, PA.  At that time he was doing well from a cardiac perspective and was without symptoms of decompensation.  With lifestyle modifications nevertheless his weight was down 40 pounds.  Blood pressure remained well-controlled.  There were no changes made to his medication regimen.  He did need to have a lipid and LFTs checked.  There was also no further DOT requirements for the year his next ETT needed to be scheduled in 2024.    He was last seen in clinic 10/10/2022 and had been doing well.  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  Denied any chest pain, shortness of breath or peripheral edema.  Has been  compliant with his current medication regimen.  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  He returns to clinic today stating that he has been doing well from a cardiac perspective.  Denies any chest pain, shortness of breath or peripheral edema.  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 Unfortunately has been out of all of his medications for little over a month at this point.  Denies any recent hospitalizations or visits to the emergency department.  ROS: 10 point review of systems has been reviewed and considered negative exception was been listed in the HPI  Studies Reviewed EKG Interpretation Date/Time:  Wednesday December 03 2023 09:54:36 EDT Ventricular Rate:  93 PR Interval:  180 QRS Duration:  82 QT Interval:  346 QTC Calculation: 430 R Axis:   14  Text Interpretation: Normal sinus rhythm Normal ECG When compared with ECG of 06-May-2017 16:08, No significant change was found Confirmed by Gerard Frederick ([id number]) on 12/03/2023 9:57:39 AM    ETT 11/01/2022   Baseline ECG demonstrates normal sinus rhythm without significant abnormalities.   The patient demonstrates good exercise capacity with normal heart rate response.  Hypertensive blood pressure response was observed.   No significant ST segment or T wave changes were observed during stress or early recovery.   No significant arrhythmia occurred.   Low risk exercise tolerance test (Duke Treadmill Score = +9).   Low risk exercise tolerance test without evidence of significant ischemia.  ETT 03/27/2021  Exercise capacity was normal. Stage 3 was reached after exercising for 8 min and 17 sec. Maximum HR of 157 bpm. MPHR 90.0 %. Peak METS 10.2 . The patient experienced no angina during the test.   No ST deviation was noted. The ECG was negative for ischemia.   Prior study available for comparison from 06/02/2019. No changes compared to prior study.   Normal  treadmill stress test with good exercise capacity.   LHC 07/2015: The left ventricular systolic function is normal. Prox RCA lesion, 95% stenosed. Post intervention, there is a 0% residual stenosis.   1. Severe one-vessel coronary artery disease with 95% hazy proximal RCA stenosis. This is likely the culprit for non-ST elevation myocardial infarction. 2. Normal LV systolic function and mildly elevated left ventricular end-diastolic pressure. 3. Successful angioplasty and drug-eluting stent placement to the proximal right coronary artery.   Recommendations: Dual antiplatelet therapy for at least 6 months and ideally for 12 months. The patient will need assistance with his medications. Brilinta  can be switched to Plavix  after one month. I switched amlodipine  to lisinopril  for cost reasons and added carvedilol . Smoking cessation is strongly advised. Discharge home tomorrow if stable.   2D echo 07/2015: - Procedure narrative: Transthoracic echocardiography. Image   quality was poor. The study was technically difficult, as a   result of poor sound wave transmission and body habitus. - Left ventricle: The cavity size was normal. Wall thickness was   increased in a pattern of mild LVH. Systolic function was normal.   The estimated ejection fraction was in the range of 55% to 65%.   Wall motion was normal; there were no regional wall motion   abnormalities. Left ventricular diastolic function parameters   were normal. - Aortic valve: Valve area (Vmax): 1.85 cm^2. - Left atrium: The atrium was normal in size. - Right ventricle: Systolic function was normal. - Pulmonary arteries: Systolic pressure was within the normal   range.   ETT 08/2015: There was no ST segment deviation noted during stress. No T wave inversion was noted during stress.   Normal treadmill stress test. Good exercise capacity. He exercised for 8 minutes. Mildly elevated blood pressure at baseline with mild hypertensive  response to exercise. Low risk Duke treadmill score.   ETT 05/2017: Blood pressure demonstrated a hypertensive response to exercise. There was no ST segment deviation noted during stress. No T wave inversion was noted during stress.   Normal treadmill stress test with no evidence of ischemia. Average exercise capacity with exercise duration of 7 minutes and 48 seconds. Elevated blood pressure at baseline at 165/101 with hypertensive response to exercise at 218/95   Limited echo 05/2017: - Left ventricle: The cavity size was normal. There was mild   concentric hypertrophy. Systolic function was normal. The   estimated ejection fraction was in the range of 60% to 65%. Wall   motion was normal; there were no regional wall motion   abnormalities. The study is not technically sufficient to allow   evaluation of LV diastolic function. - Left atrium: The atrium was normal in size. - Right ventricle: Systolic function was normal. - Pulmonary arteries: Systolic pressure could not be accurately   estimated.   ETT 05/2019: Blood pressure demonstrated a hypertensive response to exercise.   Normal treadmill stress test at 82% MPHR Hypertensive response to exercise with peak BP of 243/89 Below average exercise capacity achieving 7.2 mets and exercise duration of 6 minutes.    ETT 03/27/2021:  Exercise capacity  was normal. Stage 3 was reached after exercising for 8 min and 17 sec. Maximum HR of 157 bpm. MPHR 90.0 %. Peak METS 10.2 . The patient experienced no angina during the test.   No ST deviation was noted. The ECG was negative for ischemia.   Prior study available for comparison from 06/02/2019. No changes compared to prior study.   Normal treadmill stress test with good exercise capacity. Risk Assessment/Calculations     Physical Exam VS:  BP (!) 130/90 (BP Location: Left Arm, Patient Position: Sitting, Cuff Size: Normal)   Pulse 93   Ht 6' (1.829 m)   Wt 274 lb (124.3 kg)   SpO2 98%    BMI 37.16 kg/m        Wt Readings from Last 3 Encounters:  12/03/23 274 lb (124.3 kg)  08/12/23 278 lb 9.6 oz (126.4 kg)  05/16/23 287 lb 12.8 oz (130.5 kg)    GEN: Well nourished, well developed in no acute distress NECK: No JVD; No carotid bruits CARDIAC: RRR, no murmurs, rubs, gallops RESPIRATORY:  Clear to auscultation without rales, wheezing or rhonchi  ABDOMEN: Soft, non-tender, non-distended EXTREMITIES:  No edema; No deformity   ASSESSMENT AND PLAN Coronary artery disease involving native coronaries without angina.  He continues to do well without symptoms of angina or anginal equivalents.  Unfortunately he has been out of his medications for over a month at this point.  He has been restarted on aspirin  81 mg daily which he has been advised stable we will indefinitely, refill on his sublingual nitro to take as needed but he still continues to deny chest discomfort, and bradycardia feels on Repatha  syndrome which would likely require prior authorization.  EKG today reveals sinus rhythm with a rate of 93 with no ischemic changes noted.  He will need lab/completed in the next several weeks after being restarted on his medications.  No further ischemic testing at this time.  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  Hypertension with blood pressure today 130/90.  Unfortunately he has not had any of his medications for approximately a month.  He has been restarted on amlodipine  10 mg daily, carvedilol  25 mg twice daily, and losartan  potassium 100/25 mg daily.  He has been advised to to monitor his pressures at home 1 to 2 hours postmedication administration.  He has been scheduled for updated labs in 2 weeks to reevaluate kidney function and electrolytes after being restarted on his medications.  Mixed hyperlipidemia with an LDL of 189 which is above goal of 70.  Repatha  has been resent and waiting for prior authorization to get him  back on his cholesterol medication.  After being on Repatha  we will need repeat lipid and hepatic panel in 3 months.  Obesity with a BMI of 37.16.  Would benefit from weight loss.  Tobacco and alcohol use total cessation is recommended       Dispo: Patient to return to clinic to see MD/APP in 3 months or sooner if needed for further evaluation  Signed, Brighton Delio, NP

## 2023-12-03 NOTE — Telephone Encounter (Signed)
 Pharmacy Patient Advocate Encounter   Received notification from Physician's Office that prior authorization for Repatha  is required/requested.   Insurance verification completed.   The patient is insured through [facility] AD[employer]/RX [employer]VANCE .   Per test claim: The current 12/03/23 day co-pay is, $141.00- 3 months.  No PA needed at this time. This test claim was processed through [facility]- copay amounts may vary at other pharmacies due to pharmacy/plan contracts, or as the patient moves through the different stages of their insurance plan.

## 2023-12-03 NOTE — Patient Instructions (Signed)
 Medication Instructions:  Your physician recommends that you continue on your current medications as directed. Please refer to the Current Medication list given to you today.   *If you need a refill on your cardiac medications before your next appointment, please call your pharmacy*  Lab Work: Your provider would like for you to return in 2 weeks to have the following labs drawn: BMP.   Please go to [facility] [address] ([facility] [address] [facility]) [address], [place] [address] You do not need an appointment.  They are open from 8 am- 4:30 pm.  Lunch from 1:00 pm- 2:00 pm You DO NOT need to be fasting.   You may also go to one of the following [facility]:  [address] [place], [place] [address] Phone: [phone] Lab hours: Mon-Fri 8 am- 5 pm    Lunch 12 pm- 1 pm  [address] [place],  [place]  [address]  [place]  Phone: [phone] Lab hours: 7 am- 4 pm Lunch 12 pm-1 pm   [address] [place],  [place]  [address]  [place]  Phone: [phone] Lab hours: Mon-Fri 8 am- 5 pm    Lunch 12 pm- 1 pm  If you have labs (blood work) drawn today and your tests are completely normal, you will receive your results only by: MyChart Message (if you have MyChart) OR A paper copy in the mail If you have any lab test that is abnormal or we need to change your treatment, we will call you to review the results.  Testing/Procedures: No test ordered today   Follow-Up: At [facility], you and your health needs are our priority.  As part of our continuing mission to provide you with exceptional heart care, our providers are all part of one team.  This team includes your primary Cardiologist (physician) and Advanced Practice Providers or APPs (Physician Assistants and Nurse Practitioners) who all work together to provide you with the care you need, when you need it.  Your next appointment:   3 month(s)  Provider:   Deatrice Cage, MD or Tylene Lunch, NP

## 2023-12-17 ENCOUNTER — Telehealth: Payer: Self-pay

## 2023-12-17 DIAGNOSIS — E1169 Type 2 diabetes mellitus with other specified complication: Secondary | ICD-10-CM

## 2023-12-17 NOTE — Telephone Encounter (Signed)
 It may be beneficial for him to follow-up with lipid clinic.  We will send a referral over to lipid clinic pharmacist, since he has a statin intolerance to see what his other options are.

## 2023-12-17 NOTE — Telephone Encounter (Signed)
 Copied from CRM [id number]. Topic: Clinical - Prescription Issue >> Dec 17, 2023  1:18 PM Gustabo D wrote: Evolocumab  (REPATHA  SURECLICK) 140 MG/ML SOAJ- Patient says it to high

## 2023-12-17 NOTE — Telephone Encounter (Signed)
 I see the recent office visit with Cardiology and recent renewal of Repatha  rx and the test claim price for 3 month supply.  Patient called our office saying the cost on Repatha  is still too much for him.  I am not sure what else to offer given his prior failure of Statin therapy.  If you need our office to step in further let me know, otherwise would you be able to follow back up with patient on this issue.  Marsa Officer, DO [facility] 12/17/2023, 2:10 PM

## 2023-12-23 NOTE — Addendum Note (Signed)
 Addended by: Afomia Blackley D on: 12/23/2023 04:53 PM   Modules accepted: Orders

## 2023-12-23 NOTE — Telephone Encounter (Signed)
 Can we get him situated with this process?

## 2023-12-29 ENCOUNTER — Other Ambulatory Visit: Payer: Self-pay | Admitting: Cardiology

## 2023-12-29 DIAGNOSIS — E785 Hyperlipidemia, unspecified: Secondary | ICD-10-CM

## 2023-12-29 DIAGNOSIS — I2510 Atherosclerotic heart disease of native coronary artery without angina pectoris: Secondary | ICD-10-CM

## 2024-03-05 ENCOUNTER — Ambulatory Visit: Attending: Cardiology | Admitting: Cardiology

## 2024-03-05 NOTE — Progress Notes (Deleted)
 Cardiology Office Note   Date:  03/05/2024  ID:  Jordan Maxwell, DOB November 11, 1975, MRN [id number] PCP: Edman Marsa PARAS, DO  [facility] Providers Cardiologist:  Deatrice Cage, MD { Click to update primary MD,subspecialty MD or APP then REFRESH:1}    History of Present Illness Jordan Maxwell is a 48 y.o. male with a past medical history of coronary disease with NSTEMI (07/2015) with DES to RCA, type 2 diabetes, hypertension, hyperlipidemia (statin and ezetimibe  intolerance), obesity, tobacco use, who presents today in follow-up on his coronary artery disease.   He had presented to the hospital 07/2015 with an NSTEMI.  Left heart catheterization showed 95% proximal RCA stenosis with otherwise nonobstructive disease.  He underwent successful PCI/DES to the RCA.  Echo at that time demonstrated an EF of 55 to 60%, mild LVH, no regional wall motion abnormalities, normal LV systolic function.  Over the years he has undergone ETT per DOT guidelines since his MI most recently in 12/22 which was normal with good exercise capacity.  It was noted previously done well on Repatha  though stopped this medication due to concerns regarding worsening glucose with recommendations to resume medication and subsequently transitioned to Praluent  secondary to insurance reduction of Repatha .   He was last seen in clinic 11/02/2021 by R. Dunn, PA.  At that time he was doing well from a cardiac perspective and was without symptoms of decompensation.  With lifestyle modifications nevertheless his weight was down 40 pounds.  Blood pressure remained well-controlled.  There were no changes made to his medication regimen.  He did need to have a lipid and LFTs checked.  There was also no further DOT requirements for the year his next ETT needed to be scheduled in 2024.   He previously been seen in clinic 10/10/2022 was doing well from a cardiac perspective.  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  No medication changes were made.  He was last seen in clinic 12/03/2023 stating he has been doing well from a cardiac perspective.  Denies chest pain shortness of breath or peripheral edema.  He was scheduled for updated labs and advised to continue with his DOT he would need additional treadmill testing in the following year.   He returns to clinic today  ROS: 10 point review of systems has been reviewed and considered negative with exception with the listed in the HPI  Studies Reviewed      ETT 11/01/2022   Baseline ECG demonstrates normal sinus rhythm without significant abnormalities.   The patient demonstrates good exercise capacity with normal heart rate response.  Hypertensive blood pressure response was observed.   No significant ST segment or T wave changes were observed during stress or early recovery.   No significant arrhythmia occurred.   Low risk exercise tolerance test (Duke Treadmill Score = +9).   Low risk exercise tolerance test without evidence of significant ischemia.   ETT 03/27/2021   Exercise capacity was normal. Stage 3 was reached after exercising for 8 min and 17 sec. Maximum HR of 157 bpm. MPHR 90.0 %. Peak METS 10.2 . The patient experienced no angina during the test.   No ST deviation was noted. The ECG was negative for ischemia.   Prior study available for comparison from 06/02/2019. No changes compared to prior study.   Normal treadmill stress test with good exercise capacity.   LHC 07/2015: The left ventricular systolic function is normal. Prox RCA lesion, 95% stenosed. Post intervention,  there is a 0% residual stenosis.   1. Severe one-vessel coronary artery disease with 95% hazy proximal RCA stenosis. This is likely the culprit for non-ST elevation myocardial infarction. 2. Normal LV systolic function and mildly elevated left ventricular end-diastolic pressure. 3. Successful angioplasty and drug-eluting stent placement to the proximal right coronary  artery.   Recommendations: Dual antiplatelet therapy for at least 6 months and ideally for 12 months. The patient will need assistance with his medications. Brilinta  can be switched to Plavix  after one month. I switched amlodipine  to lisinopril  for cost reasons and added carvedilol . Smoking cessation is strongly advised. Discharge home tomorrow if stable.   2D echo 07/2015: - Procedure narrative: Transthoracic echocardiography. Image   quality was poor. The study was technically difficult, as a   result of poor sound wave transmission and body habitus. - Left ventricle: The cavity size was normal. Wall thickness was   increased in a pattern of mild LVH. Systolic function was normal.   The estimated ejection fraction was in the range of 55% to 65%.   Wall motion was normal; there were no regional wall motion   abnormalities. Left ventricular diastolic function parameters   were normal. - Aortic valve: Valve area (Vmax): 1.85 cm^2. - Left atrium: The atrium was normal in size. - Right ventricle: Systolic function was normal. - Pulmonary arteries: Systolic pressure was within the normal   range.   ETT 08/2015: There was no ST segment deviation noted during stress. No T wave inversion was noted during stress.   Normal treadmill stress test. Good exercise capacity. He exercised for 8 minutes. Mildly elevated blood pressure at baseline with mild hypertensive response to exercise. Low risk Duke treadmill score.   ETT 05/2017: Blood pressure demonstrated a hypertensive response to exercise. There was no ST segment deviation noted during stress. No T wave inversion was noted during stress.   Normal treadmill stress test with no evidence of ischemia. Average exercise capacity with exercise duration of 7 minutes and 48 seconds. Elevated blood pressure at baseline at 165/101 with hypertensive response to exercise at 218/95   Limited echo 05/2017: - Left ventricle: The cavity size was normal.  There was mild   concentric hypertrophy. Systolic function was normal. The   estimated ejection fraction was in the range of 60% to 65%. Wall   motion was normal; there were no regional wall motion   abnormalities. The study is not technically sufficient to allow   evaluation of LV diastolic function. - Left atrium: The atrium was normal in size. - Right ventricle: Systolic function was normal. - Pulmonary arteries: Systolic pressure could not be accurately   estimated.   ETT 05/2019: Blood pressure demonstrated a hypertensive response to exercise.   Normal treadmill stress test at 82% MPHR Hypertensive response to exercise with peak BP of 243/89 Below average exercise capacity achieving 7.2 mets and exercise duration of 6 minutes.    ETT 03/27/2021:  Exercise capacity was normal. Stage 3 was reached after exercising for 8 min and 17 sec. Maximum HR of 157 bpm. MPHR 90.0 %. Peak METS 10.2 . The patient experienced no angina during the test.   No ST deviation was noted. The ECG was negative for ischemia.   Prior study available for comparison from 06/02/2019. No changes compared to prior study.   Normal treadmill stress test with good exercise capacity.  Risk Assessment/Calculations   No BP recorded.  {Refresh Note OR Click here to enter BP  :1}***  Physical Exam VS:  There were no vitals taken for this visit.       Wt Readings from Last 3 Encounters:  12/03/23 274 lb (124.3 kg)  08/12/23 278 lb 9.6 oz (126.4 kg)  05/16/23 287 lb 12.8 oz (130.5 kg)    GEN: Well nourished, well developed in no acute distress NECK: No JVD; No carotid bruits CARDIAC: ***RRR, no murmurs, rubs, gallops RESPIRATORY:  Clear to auscultation without rales, wheezing or rhonchi  ABDOMEN: Soft, non-tender, non-distended EXTREMITIES:  No edema; No deformity   ASSESSMENT AND PLAN Coronary artery disease involving native coronary arteries without angina Primary hypertension Mixed  hyperlipidemia Obesity Tobacco and alcohol use    {Are you ordering a CV Procedure (e.g. stress test, cath, DCCV, TEE, etc)?   Press F2        :[id number]}  Dispo: ***  Signed, Micha Dosanjh, NP

## 2024-03-16 ENCOUNTER — Other Ambulatory Visit: Payer: Self-pay | Admitting: Internal Medicine

## 2024-03-17 ENCOUNTER — Other Ambulatory Visit: Payer: Self-pay

## 2024-03-17 MED ORDER — TIRZEPATIDE 7.5 MG/0.5ML ~~LOC~~ SOAJ: 7.5000 mg | SUBCUTANEOUS | 0 refills | Status: DC

## 2024-03-17 NOTE — Telephone Encounter (Signed)
 Requested medication (s) are due for refill today - yes  Requested medication (s) are on the active medication list -yes  Future visit scheduled -no  Last refill: 11/20/23 6ml  Notes to clinic: off protocol- provider review   Requested Prescriptions  Pending Prescriptions Disp Refills   MOUNJARO  7.5 MG/0.5ML Pen [Pharmacy Med Name: Mounjaro  7.5 MG/0.5ML Subcutaneous Solution Auto-injector] 12 mL 0    Sig: INJECT 7.5 MG SUBCUTANEOUSLY  ONCE A WEEK     Off-Protocol Failed - 03/17/2024 11:07 AM      Failed - Medication not assigned to a protocol, review manually.      Passed - Valid encounter within last 12 months    Recent Outpatient Visits           7 months ago Type 2 diabetes mellitus with other specified complication, without long-term current use of insulin [facility])   [place] [facility] [place], Marsa PARAS, DO                 Requested Prescriptions  Pending Prescriptions Disp Refills   MOUNJARO  7.5 MG/0.5ML Pen [Pharmacy Med Name: Mounjaro  7.5 MG/0.5ML Subcutaneous Solution Auto-injector] 12 mL 0    Sig: INJECT 7.5 MG SUBCUTANEOUSLY  ONCE A WEEK     Off-Protocol Failed - 03/17/2024 11:07 AM      Failed - Medication not assigned to a protocol, review manually.      Passed - Valid encounter within last 12 months    Recent Outpatient Visits           7 months ago Type 2 diabetes mellitus with other specified complication, without long-term current use of insulin [facility])   [facility] [place], Marsa PARAS, [place]

## 2024-04-19 ENCOUNTER — Telehealth: Payer: Self-pay | Admitting: Pharmacy Technician

## 2024-04-19 ENCOUNTER — Other Ambulatory Visit (HOSPITAL_COMMUNITY): Payer: Self-pay

## 2024-04-19 NOTE — Telephone Encounter (Signed)
" ° °  I got the patient a coupon   Now cost is 15.00 "

## 2024-04-19 NOTE — Telephone Encounter (Signed)
" ° ° °  Pharmacy Patient Advocate Encounter   Received notification from Onbase that prior authorization for REPATHA  is required/requested.   Insurance verification completed.   The patient is insured through [facility].   Per test claim: PA required; PA submitted to above mentioned insurance via Latent Key/confirmation #/EOC [id number] Status is pending  "

## 2024-04-19 NOTE — Telephone Encounter (Signed)
 Pharmacy Patient Advocate Encounter  Received notification from [facility] that Prior Authorization for repatha  has been APPROVED from 04/19/24 to 04/19/25   PA #/Case ID/Reference #: [id number]    I got the patient a coupon and now cost is 15.00

## 2024-05-19 ENCOUNTER — Encounter: Payer: Self-pay | Admitting: Family Medicine

## 2024-05-19 ENCOUNTER — Ambulatory Visit (INDEPENDENT_AMBULATORY_CARE_PROVIDER_SITE_OTHER): Admitting: Family Medicine

## 2024-05-19 ENCOUNTER — Ambulatory Visit: Admitting: Family Medicine

## 2024-05-19 VITALS — BP 137/82 | HR 93 | Resp 16 | Ht 72.0 in | Wt 270.4 lb

## 2024-05-19 VITALS — BP 142/90 | HR 93 | Ht 72.0 in | Wt 270.4 lb

## 2024-05-19 DIAGNOSIS — Z7985 Long-term (current) use of injectable non-insulin antidiabetic drugs: Secondary | ICD-10-CM | POA: Diagnosis not present

## 2024-05-19 DIAGNOSIS — Z1211 Encounter for screening for malignant neoplasm of colon: Secondary | ICD-10-CM | POA: Diagnosis not present

## 2024-05-19 DIAGNOSIS — F17210 Nicotine dependence, cigarettes, uncomplicated: Secondary | ICD-10-CM | POA: Diagnosis not present

## 2024-05-19 DIAGNOSIS — L301 Dyshidrosis [pompholyx]: Secondary | ICD-10-CM | POA: Diagnosis not present

## 2024-05-19 DIAGNOSIS — I2510 Atherosclerotic heart disease of native coronary artery without angina pectoris: Secondary | ICD-10-CM

## 2024-05-19 DIAGNOSIS — Z125 Encounter for screening for malignant neoplasm of prostate: Secondary | ICD-10-CM

## 2024-05-19 DIAGNOSIS — Z0000 Encounter for general adult medical examination without abnormal findings: Secondary | ICD-10-CM | POA: Diagnosis not present

## 2024-05-19 DIAGNOSIS — E782 Mixed hyperlipidemia: Secondary | ICD-10-CM | POA: Diagnosis not present

## 2024-05-19 DIAGNOSIS — I252 Old myocardial infarction: Secondary | ICD-10-CM | POA: Diagnosis not present

## 2024-05-19 DIAGNOSIS — I10 Essential (primary) hypertension: Secondary | ICD-10-CM | POA: Diagnosis not present

## 2024-05-19 DIAGNOSIS — E1159 Type 2 diabetes mellitus with other circulatory complications: Secondary | ICD-10-CM

## 2024-05-19 MED ORDER — TRIAMCINOLONE ACETONIDE 0.5 % EX CREA: 1.0000 | TOPICAL_CREAM | Freq: Two times a day (BID) | CUTANEOUS | 0 refills | Status: AC

## 2024-05-19 MED ORDER — MOUNJARO 10 MG/0.5ML ~~LOC~~ SOAJ: 10.0000 mg | SUBCUTANEOUS | 1 refills | Status: AC

## 2024-05-19 NOTE — Patient Instructions (Addendum)
 Thank you for coming to the office today.  Labs + Urine today  Dose inc Mounjaro  7.5 up to 10mg  weekly  Bring coupon for Repatha  to pharmacy  When ready DOT physical call to schedule with [facility]  Colon Cancer Screening: Ordered the Cologuard (home kit) test for colon cancer screening. Stay tuned for further updates.  It will be shipped to you directly. If not received in 2-4 weeks, call us  or the company.   If you send it back and no results are received in 2-4 weeks, call us  or the company as well!   Colon Cancer Screening: - For all adults age 27+ routine colon cancer screening is highly recommended.     - Recent guidelines from American [employer] Society recommend starting age of 59 - Early detection of colon cancer is important, because often there are no warning signs or symptoms, also if found early usually it can be cured. Late stage is hard to treat.   - If Cologuard is NEGATIVE, then it is good for 3 years before next due - If Cologuard is POSITIVE, then it is strongly advised to get a Colonoscopy, which allows the GI doctor to locate the source of the cancer or polyp (even very early stage) and treat it by removing it. ------------------------- Follow instructions to collect sample, you may call the company for any help or questions, 24/7 telephone support at [phone].   Please schedule a Follow-up Appointment to: Return for 6 month DM A1c, HTN, HLD/Cards updates.  If you have any other questions or concerns, please feel free to call the office or send a message through MyChart. You may also schedule an earlier appointment if necessary.  Additionally, you may be receiving a survey about your experience at our office within a few days to 1 week by e-mail or mail. We value your feedback.  Marsa Officer, DO [facility], [place]

## 2024-05-19 NOTE — Progress Notes (Signed)
 Re-scheduled this AM as physical instead of office visit.  Please refer to the other chart today for Physical and new documentation. This is a duplicate chart.

## 2024-05-19 NOTE — Progress Notes (Signed)
 "  Subjective:    Patient ID: Jordan Maxwell, male    DOB: Dec 30, 1975, 49 y.o.   MRN: [id number]  Jordan Maxwell is a 49 y.o. male presenting on 05/19/2024 for Annual Exam   HPI  Discussed the use of AI scribe software for clinical note transcription with the patient, who gave verbal consent to proceed.  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 Interested in dose increase to 10mg  now - No neuropathy or abnormal nerve sensations in feet - Due for DM Eye Exam Dr Carolee  CHRONIC HTN: Reports elevated BP. 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 Tolerating well, w/o complaints. Denies CP, dyspnea, HA, edema, dizziness / lightheadedness  Eczematous dermatitis of the hands - Dry, cracking skin on hands - Attributed to frequent hand washing - Uses liquid bandage to prevent further cracking  Tobacco use disorder - Smoking since age 75 or 53 - Currently smokes approximately one pack per day - Previous unsuccessful quit attempts with Chantix, nicotine patches, and hypnotism  Hyperlipidemia / CAD Followed by Cardiology Medication access issues on repatha . Recent delay in Repatha  prescription pickup resulted in price increase from $15 to $75     Health Maintenance: Due for Colon CA Screening. Ordered Cologuard  Decline Prevnar and Shingles vaccines age 16+     05/19/2024    8:30 AM 05/19/2024    8:03 AM 08/12/2023   10:48 AM  Depression screen PHQ 2/9  Decreased Interest 0 0 0  Down, Depressed, Hopeless 0 0 0  PHQ - 2 Score 0 0 0  Altered sleeping  0 0  Tired, decreased energy  0 1  Change in appetite  0 0  Feeling bad or failure about yourself   0 0  Trouble concentrating  0 0  Moving slowly or fidgety/restless  0 0  Suicidal thoughts  0 0  PHQ-9 Score  0 1   Difficult doing work/chores   Not  difficult at all     Data saved with a previous flowsheet row definition       05/19/2024    8:03 AM 08/12/2023   10:48 AM 05/09/2023    8:53 AM 08/31/2021    8:23 AM  GAD 7 : Generalized Anxiety Score  Nervous, Anxious, on Edge 0 0  0  0   Control/stop worrying 0 0  0  0   Worry too much - different things 0 0  0  0   Trouble relaxing 0 0  0  0   Restless 0 0  0  0   Easily annoyed or irritable 0 0  0  0   Afraid - awful might happen 0 0  0  0   Total GAD 7 Score 0 0 0 0  Anxiety Difficulty  Not difficult at all  Not difficult at all     Data saved with a previous flowsheet row definition     Past Medical History:  Diagnosis Date   CAD (coronary artery disease)    a. 07/2015 NSTEMI/Cath: 95% stenosed pRCA s/p PCI/DES, otw angiographically nl cors, LVEF 55-65%, no MS/AS; b. 05/2017 ETT (DOT req): Ex time 7:48, no ST/T changes. HTN response; c. 05/2019 ETT (DOT): Ex time 6:00, no ST/T changes. HTN response.   H/O medication noncompliance    Hyperlipidemia    Hypertension  LVH (left ventricular hypertrophy)    a. echo 07/30/2015: EF 55-65%, no RWMA, LV diastolic fxn nl, LA nl in size, PASP nl; b. 05/2017 Echo: EF 60-65%, no wrma. Nl RV fxn.   NSTEMI (non-ST elevated myocardial infarction) (HCC) 07/30/2015   Statin intolerance    Tobacco abuse    Past Surgical History:  Procedure Laterality Date   CARDIAC CATHETERIZATION N/A 07/31/2015   Procedure: Left Heart Cath and Coronary Angiography;  Surgeon: Deatrice DELENA Cage, MD;  Location: [facility] INVASIVE CV LAB;  Service: Cardiovascular;  Laterality: N/A;   CARDIAC CATHETERIZATION N/A 07/31/2015   Procedure: Coronary Stent Intervention;  Surgeon: Deatrice DELENA Cage, MD;  Location: [facility] INVASIVE CV LAB;  Service: Cardiovascular;  Laterality: N/A;   Social History   Socioeconomic History   Marital status: Married    Spouse name: Not on file   Number of children: 4   Years of education: Not on file   Highest education level: Associate degree:  occupational, scientist, product/process development, or vocational program  Occupational History   Occupation: Truck Hospital Doctor    Comment: [employer]  Tobacco Use   Smoking status: Every Day    Current packs/day: 1.00    Average packs/day: 0.7 packs/day for 39.1 years (26.1 ttl pk-yrs)    Types: Cigarettes    Start date: 04/22/1985   Smokeless tobacco: Current    Types: Chew  Vaping Use   Vaping status: Never Used  Substance and Sexual Activity   Alcohol use: Yes    Comment: every change I get   Drug use: Not Currently    Types: Marijuana    Comment: past   Sexual activity: Yes    Partners: Female    Birth control/protection: None  Other Topics Concern   Not on file  Social History Narrative   Not on file   Social Drivers of Health   Tobacco Use: High Risk (05/19/2024)   Patient History    Smoking Tobacco Use: Every Day    Smokeless Tobacco Use: Current    Passive Exposure: Not on file  Financial Resource Strain: Low Risk (05/09/2023)   Overall Financial Resource Strain (CARDIA)    Difficulty of Paying Living Expenses: Not hard at all  Food Insecurity: No Food Insecurity (05/09/2023)   Hunger Vital Sign    Worried About Running Out of Food in the Last Year: Never true    Ran Out of Food in the Last Year: Never true  Transportation Needs: No Transportation Needs (05/09/2023)   PRAPARE - Administrator, Civil Service (Medical): No    Lack of Transportation (Non-Medical): No  Physical Activity: Insufficiently Active (05/09/2023)   Exercise Vital Sign    Days of Exercise per Week: 1 day    Minutes of Exercise per Session: 10 min  Stress: No Stress Concern Present (05/09/2023)   [employer] of Occupational Health - Occupational Stress Questionnaire    Feeling of Stress : Not at all  Social Connections: Moderately Isolated (05/09/2023)   Social Connection and Isolation Panel    Frequency of Communication with Friends and Family: More than three times a week    Frequency of Social  Gatherings with Friends and Family: More than three times a week    Attends Religious Services: Never    Database Administrator or Organizations: No    Attends Banker Meetings: Not on file    Marital Status: Married  Intimate Partner Violence: Not on file  Depression (PHQ2-9): Low Risk (05/19/2024)  Depression (PHQ2-9)    PHQ-2 Score: 0  Alcohol Screen: Low Risk (05/09/2023)   Alcohol Screen    Last Alcohol Screening Score (AUDIT): 5  Housing: Unknown (05/09/2023)   Housing Stability Vital Sign    Unable to Pay for Housing in the Last Year: No    Number of Times Moved in the Last Year: Not on file    Homeless in the Last Year: No  Utilities: Not on file  Health Literacy: Not on file   Family History  Problem Relation Age of Onset   Hypertension Mother    Diabetes Mother    Current Outpatient Medications on File Prior to Visit  Medication Sig   amLODipine  (NORVASC ) 10 MG tablet Take 1 tablet (10 mg total) by mouth daily.   aspirin  EC 81 MG tablet Take 1 tablet (81 mg total) by mouth daily.   carvedilol  (COREG ) 25 MG tablet Take 1 tablet (25 mg total) by mouth 2 (two) times daily with a meal.   Evolocumab  (REPATHA  SURECLICK) 140 MG/ML SOAJ INJECT 140 MG INTO THE SKIN EVERY 14 DAYS   losartan -hydrochlorothiazide  (HYZAAR) 100-25 MG tablet TAKE 1 TABLET BY MOUTH ONCE DAILY. STOP LOSARTAN    nitroGLYCERIN  (NITROSTAT ) 0.4 MG SL tablet Place 1 tablet (0.4 mg total) under the tongue every 5 (five) minutes as needed for chest pain.   No current facility-administered medications on file prior to visit.    Review of Systems  Constitutional:  Negative for activity change, appetite change, chills, diaphoresis, fatigue and fever.  HENT:  Negative for congestion and hearing loss.   Eyes:  Negative for visual disturbance.  Respiratory:  Negative for cough, chest tightness, shortness of breath and wheezing.   Cardiovascular:  Negative for chest pain, palpitations and leg swelling.   Gastrointestinal:  Negative for abdominal pain, constipation, diarrhea, nausea and vomiting.  Genitourinary:  Negative for dysuria, frequency and hematuria.  Musculoskeletal:  Negative for arthralgias and neck pain.  Skin:  Negative for rash.  Neurological:  Negative for dizziness, weakness, light-headedness, numbness and headaches.  Hematological:  Negative for adenopathy.  Psychiatric/Behavioral:  Negative for behavioral problems, dysphoric mood and sleep disturbance.    Per HPI unless specifically indicated above     Objective:    BP 137/82 (BP Location: Left Arm, Patient Position: Sitting, Cuff Size: Normal)   Pulse 93   Resp 16   Ht 6' (1.829 m)   Wt 270 lb 6 oz (122.6 kg)   BMI 36.67 kg/m   Wt Readings from Last 3 Encounters:  05/19/24 270 lb 6 oz (122.6 kg)  05/19/24 270 lb 6 oz (122.6 kg)  12/03/23 274 lb (124.3 kg)    Physical Exam Vitals and nursing note reviewed.  Constitutional:      General: He is not in acute distress.    Appearance: He is well-developed. He is obese. He is not diaphoretic.     Comments: Well-appearing, comfortable, cooperative  HENT:     Head: Normocephalic and atraumatic.  Eyes:     General:        Right eye: No discharge.        Left eye: No discharge.     Conjunctiva/sclera: Conjunctivae normal.     Pupils: Pupils are equal, round, and reactive to light.  Neck:     Thyroid: No thyromegaly.     Vascular: No carotid bruit.  Cardiovascular:     Rate and Rhythm: Normal rate and regular rhythm.     Pulses: Normal pulses.  Heart sounds: Normal heart sounds. No murmur heard. Pulmonary:     Effort: Pulmonary effort is normal. No respiratory distress.     Breath sounds: Normal breath sounds. No wheezing or rales.  Abdominal:     General: Bowel sounds are normal. There is no distension.     Palpations: Abdomen is soft. There is no mass.     Tenderness: There is no abdominal tenderness.  Musculoskeletal:        General: No tenderness.  Normal range of motion.     Cervical back: Normal range of motion and neck supple.     Right lower leg: No edema.     Left lower leg: No edema.     Comments: Upper / Lower Extremities: - Normal muscle tone, strength bilateral upper extremities 5/5, lower extremities 5/5  Lymphadenopathy:     Cervical: No cervical adenopathy.  Skin:    General: Skin is warm and dry.     Findings: No erythema or rash.  Neurological:     Mental Status: He is alert and oriented to person, place, and time.     Comments: Distal sensation intact to light touch all extremities  Psychiatric:        Mood and Affect: Mood normal.        Behavior: Behavior normal.        Thought Content: Thought content normal.     Comments: Well groomed, good eye contact, normal speech and thoughts     Diabetic Foot Exam - Simple   Simple Foot Form Diabetic Foot exam was performed with the following findings: Yes 05/19/2024  8:34 AM  Visual Inspection No deformities, no ulcerations, no other skin breakdown bilaterally: Yes Sensation Testing Intact to touch and monofilament testing bilaterally: Yes Pulse Check Posterior Tibialis and Dorsalis pulse intact bilaterally: Yes Comments      Results for orders placed or performed in visit on 08/12/23  POCT glycosylated hemoglobin (Hb A1C)   Collection Time: 08/12/23 10:52 AM  Result Value Ref Range   Hemoglobin A1C 5.9 (A) 4.0 - 5.6 %   HbA1c POC (<> result, manual entry)     HbA1c, POC (prediabetic range)     HbA1c, POC (controlled diabetic range)        Assessment & Plan:   Problem List Items Addressed This Visit     CAD (coronary artery disease)   Relevant Orders   Lipid panel   Comprehensive metabolic panel with GFR   Cigarette nicotine dependence without complication   Essential hypertension   Relevant Orders   CBC with Differential/Platelet   Comprehensive metabolic panel with GFR   History of non-ST elevation myocardial infarction (NSTEMI)   Mixed  hyperlipidemia   Relevant Orders   Lipid panel   TSH   Comprehensive metabolic panel with GFR   Morbid obesity (HCC)   Relevant Medications   MOUNJARO  10 MG/0.5ML Pen   Other Relevant Orders   TSH   Comprehensive metabolic panel with GFR   Type 2 diabetes mellitus with vascular disease (HCC)   Relevant Medications   MOUNJARO  10 MG/0.5ML Pen   Other Relevant Orders   Hemoglobin A1c   Microalbumin / creatinine urine ratio   TSH   Comprehensive metabolic panel with GFR   Other Visit Diagnoses       Annual physical exam    -  Primary   Relevant Orders   Lipid panel   Hemoglobin A1c   CBC with Differential/Platelet   PSA   Microalbumin /  creatinine urine ratio   TSH   Comprehensive metabolic panel with GFR     Colon cancer screening       Relevant Orders   Cologuard     Screening for prostate cancer       Relevant Orders   PSA     Long-term current use of injectable noninsulin antidiabetic medication         Dyshidrotic hand dermatitis       Relevant Medications   triamcinolone  cream (KENALOG ) 0.5 %        Updated Health Maintenance information Fasting labs ordered today, pending Encouraged improvement to lifestyle with diet and exercise Goal of weight loss  Type 2 diabetes mellitus with vascular disease, CAD Due for repeat A1c today, last done in 2025 had been controlled on GLP Mounjaro  Eligible for dose increase today Managed with Mounjaro . Blood sugar levels require monitoring. - Increased Mounjaro  from 7.5 to 10 mg with a three-month supply and refill. Will need new updated PA - Labs ordered - DM Foot exam normal - Urine microalbumin ordered - Due for DM Eye Exam w/ Dr [facility]olee [facility] to be scheduled  CAD, History MI Followed by Cardiology On med management  Hypertension Mild elevated initial, repeat improved. Awaiting full control of BP before pursuing DOT exam scheduling Managed with losartan  and hydrochlorothiazide  100-25 mg daily. Weight loss and  smoking cessation may aid in control. - Continue losartan  and hydrochlorothiazide .  Hyperlipidemia Managed with Repatha . Pharmacy pricing issues addressed with coupon, was $75 per month but should have coupon code from clinical pharmacy team for $15 - Provided coupon for Repatha  to reduce cost. - Ensure Repatha  is available at the pharmacy.  Nicotine dependence, cigarettes Chronic nicotine dependence with current use of one pack per day. Not ready to quit. - Previous trial on Chantix, not successful - Encouraged reduction in smoking.  Morbid Obesity BMI >36 Management includes Mounjaro , which may aid in weight loss and help with blood pressure control. - Increased Mounjaro  to 10 mg to aid in weight loss. - Encourage lifestyle modification  Dyshidrotic eczema Symptoms include dry spots and cracking, likely exacerbated by frequent hand washing. - Prescribed topical medicated ointment with a steroid. Triamcinolone   General Health Maintenance Due for colon cancer screening and vaccine updates. Declined pneumonia and shingles vaccines. - Ordered Cologuard for colon cancer screening age 69+ - Declined pneumonia and shingles vaccines. Age 89+ - Plan for eye exam with Dr. Carolee. - Plan for dental check-up on February 4th.    Orders Placed This Encounter  Procedures   Cologuard   Lipid panel    Has the patient fasted?:   Yes   Hemoglobin A1c   CBC with Differential/Platelet   PSA   Microalbumin / creatinine urine ratio   TSH   Comprehensive metabolic panel with GFR    Has the patient fasted?:   Yes    Meds ordered this encounter  Medications   MOUNJARO  10 MG/0.5ML Pen    Sig: Inject 10 mg into the skin once a week.    Dispense:  6 mL    Refill:  1    Dose inc from 7.5 up to 10mg    triamcinolone  cream (KENALOG ) 0.5 %    Sig: Apply 1 Application topically 2 (two) times daily. To affected areas, for up to 2 weeks.    Dispense:  30 g    Refill:  0     Follow up  plan: Return for 6 month DM A1c,  HTN, HLD/Cards updates.  Marsa Officer, DO [facility] 05/19/2024, 8:27 AM  "

## 2024-05-20 ENCOUNTER — Encounter: Payer: Self-pay | Admitting: Internal Medicine

## 2024-05-20 ENCOUNTER — Ambulatory Visit: Admitting: Internal Medicine

## 2024-05-20 VITALS — BP 132/88 | HR 91 | Ht 72.0 in | Wt 270.0 lb

## 2024-05-20 DIAGNOSIS — Z024 Encounter for examination for driving license: Secondary | ICD-10-CM

## 2024-05-20 LAB — COMPREHENSIVE METABOLIC PANEL WITH GFR
AG Ratio: 1.7 (calc) (ref 1.0–2.5)
ALT: 22 U/L (ref 9–46)
AST: 20 U/L (ref 10–35)
Albumin: 4.5 g/dL (ref 3.6–5.1)
Alkaline phosphatase (APISO): 53 U/L (ref 35–144)
BUN: 16 mg/dL (ref 7–25)
CO2: 29 mmol/L (ref 20–32)
Calcium: 9.8 mg/dL (ref 8.6–10.3)
Chloride: 103 mmol/L (ref 98–110)
Creat: 1.08 mg/dL (ref 0.70–1.30)
Globulin: 2.7 g/dL (ref 1.9–3.7)
Glucose, Bld: 110 mg/dL — ABNORMAL HIGH (ref 65–99)
Potassium: 4.1 mmol/L (ref 3.5–5.3)
Sodium: 140 mmol/L (ref 135–146)
Total Bilirubin: 0.6 mg/dL (ref 0.2–1.2)
Total Protein: 7.2 g/dL (ref 6.1–8.1)
eGFR: 84 mL/min/{1.73_m2}

## 2024-05-20 LAB — HEMOGLOBIN A1C
Hgb A1c MFr Bld: 5.7 % — ABNORMAL HIGH
Mean Plasma Glucose: 117 mg/dL
eAG (mmol/L): 6.5 mmol/L

## 2024-05-20 LAB — CBC WITH DIFFERENTIAL/PLATELET
Absolute Lymphocytes: 1006 {cells}/uL (ref 850–3900)
Absolute Monocytes: 383 {cells}/uL (ref 200–950)
Basophils Absolute: 30 {cells}/uL (ref 0–200)
Basophils Relative: 0.7 %
Eosinophils Absolute: 60 {cells}/uL (ref 15–500)
Eosinophils Relative: 1.4 %
HCT: 54.1 % — ABNORMAL HIGH (ref 39.4–51.1)
Hemoglobin: 17.9 g/dL — ABNORMAL HIGH (ref 13.2–17.1)
MCH: 31.1 pg (ref 27.0–33.0)
MCHC: 33.1 g/dL (ref 31.6–35.4)
MCV: 94.1 fL (ref 81.4–101.7)
MPV: 10.5 fL (ref 7.5–12.5)
Monocytes Relative: 8.9 %
Neutro Abs: 2821 {cells}/uL (ref 1500–7800)
Neutrophils Relative %: 65.6 %
Platelets: 310 10*3/uL (ref 140–400)
RBC: 5.75 Million/uL (ref 4.20–5.80)
RDW: 12.6 % (ref 11.0–15.0)
Total Lymphocyte: 23.4 %
WBC: 4.3 10*3/uL (ref 3.8–10.8)

## 2024-05-20 LAB — LIPID PANEL
Cholesterol: 200 mg/dL — ABNORMAL HIGH
HDL: 45 mg/dL
LDL Cholesterol (Calc): 137 mg/dL — ABNORMAL HIGH
Non-HDL Cholesterol (Calc): 155 mg/dL — ABNORMAL HIGH
Total CHOL/HDL Ratio: 4.4 (calc)
Triglycerides: 82 mg/dL

## 2024-05-20 LAB — PSA: PSA: 1.14 ng/mL

## 2024-05-20 LAB — MICROALBUMIN / CREATININE URINE RATIO
Creatinine, Urine: 360 mg/dL — ABNORMAL HIGH (ref 20–320)
Microalb Creat Ratio: 16 mg/g{creat}
Microalb, Ur: 5.6 mg/dL

## 2024-05-20 LAB — TSH: TSH: 0.34 m[IU]/L — ABNORMAL LOW (ref 0.40–4.50)

## 2024-05-20 NOTE — Progress Notes (Signed)
 Commercial Driver Medical Examination   Jordan Maxwell is a 49 y.o. male who presents today for a commercial driver fitness determination physical exam. The patient has a history of HTN, HLD with CAD status post MI, DM 2.  His last A1c was 5.7%, 04/2024.  Review of Systems   Past Medical History:  Diagnosis Date   CAD (coronary artery disease)    a. 07/2015 NSTEMI/Cath: 95% stenosed pRCA s/p PCI/DES, otw angiographically nl cors, LVEF 55-65%, no MS/AS; b. 05/2017 ETT (DOT req): Ex time 7:48, no ST/T changes. HTN response; c. 05/2019 ETT (DOT): Ex time 6:00, no ST/T changes. HTN response.   H/O medication noncompliance    Hyperlipidemia    Hypertension    LVH (left ventricular hypertrophy)    a. echo 07/30/2015: EF 55-65%, no RWMA, LV diastolic fxn nl, LA nl in size, PASP nl; b. 05/2017 Echo: EF 60-65%, no wrma. Nl RV fxn.   NSTEMI (non-ST elevated myocardial infarction) (HCC) 07/30/2015   Statin intolerance    Tobacco abuse     Current Outpatient Medications  Medication Sig Dispense Refill   amLODipine  (NORVASC ) 10 MG tablet Take 1 tablet (10 mg total) by mouth daily. 90 tablet 3   aspirin  EC 81 MG tablet Take 1 tablet (81 mg total) by mouth daily. 90 tablet 3   carvedilol  (COREG ) 25 MG tablet Take 1 tablet (25 mg total) by mouth 2 (two) times daily with a meal. 180 tablet 3   Evolocumab  (REPATHA  SURECLICK) 140 MG/ML SOAJ INJECT 140 MG INTO THE SKIN EVERY 14 DAYS 6 mL 1   losartan -hydrochlorothiazide  (HYZAAR) 100-25 MG tablet TAKE 1 TABLET BY MOUTH ONCE DAILY. STOP LOSARTAN  90 tablet 3   MOUNJARO  10 MG/0.5ML Pen Inject 10 mg into the skin once a week. 6 mL 1   nitroGLYCERIN  (NITROSTAT ) 0.4 MG SL tablet Place 1 tablet (0.4 mg total) under the tongue every 5 (five) minutes as needed for chest pain. 25 tablet 3   triamcinolone  cream (KENALOG ) 0.5 % Apply 1 Application topically 2 (two) times daily. To affected areas, for up to 2 weeks. 30 g 0   No current facility-administered medications for this  visit.    Allergies  Allergen Reactions   Egg Protein-Containing Drug Products Anaphylaxis    Incident happened as a child   Statins     Family History  Problem Relation Age of Onset   Hypertension Mother    Diabetes Mother     Social History   Socioeconomic History   Marital status: Married    Spouse name: Not on file   Number of children: 4   Years of education: Not on file   Highest education level: Associate degree: occupational, scientist, product/process development, or vocational program  Occupational History   Occupation: Truck [employer] Doctor    Comment: [employer]  Tobacco Use   Smoking status: Every Day    Current packs/day: 1.00    Average packs/day: 0.7 packs/day for 39.1 years (26.1 ttl pk-yrs)    Types: Cigarettes    Start date: 04/22/1985   Smokeless tobacco: Current    Types: Chew  Vaping Use   Vaping status: Never Used  Substance and Sexual Activity   Alcohol use: Yes    Comment: every change I get   Drug use: Not Currently    Types: Marijuana    Comment: past   Sexual activity: Yes    Partners: Female    Birth control/protection: None  Other Topics Concern   Not on file  Social History Narrative   Not on file   Social Drivers of Health   Tobacco Use: High Risk (05/19/2024)   Patient History    Smoking Tobacco Use: Every Day    Smokeless Tobacco Use: Current    Passive Exposure: Not on file  Financial Resource Strain: Low Risk (05/09/2023)   Overall Financial Resource Strain (CARDIA)    Difficulty of Paying Living Expenses: Not hard at all  Food Insecurity: No Food Insecurity (05/09/2023)   Hunger Vital Sign    Worried About Running Out of Food in the Last Year: Never true    Ran Out of Food in the Last Year: Never true  Transportation Needs: No Transportation Needs (05/09/2023)   PRAPARE - Administrator, Civil Service (Medical): No    Lack of Transportation (Non-Medical): No  Physical Activity: Insufficiently Active (05/09/2023)   Exercise Vital Sign     Days of Exercise per Week: 1 day    Minutes of Exercise per Session: 10 min  Stress: No Stress Concern Present (05/09/2023)   [employer] of Occupational Health - Occupational Stress Questionnaire    Feeling of Stress : Not at all  Social Connections: Moderately Isolated (05/09/2023)   Social Connection and Isolation Panel    Frequency of Communication with Friends and Family: More than three times a week    Frequency of Social Gatherings with Friends and Family: More than three times a week    Attends Religious Services: Never    Database Administrator or Organizations: No    Attends Engineer, Structural: Not on file    Marital Status: Married  Catering Manager Violence: Not on file  Depression (PHQ2-9): Low Risk (05/19/2024)   Depression (PHQ2-9)    PHQ-2 Score: 0  Alcohol Screen: Low Risk (05/09/2023)   Alcohol Screen    Last Alcohol Screening Score (AUDIT): 5  Housing: Unknown (05/09/2023)   Housing Stability Vital Sign    Unable to Pay for Housing in the Last Year: No    Number of Times Moved in the Last Year: Not on file    Homeless in the Last Year: No  Utilities: Not on file  Health Literacy: Not on file     Constitutional: Denies fever, malaise, fatigue, headache or abrupt weight changes.  HEENT: Denies eye pain, eye redness, ear pain, ringing in the ears, wax buildup, runny nose, nasal congestion, bloody nose, or sore throat. Respiratory: Denies difficulty breathing, shortness of breath, cough or sputum production.   Cardiovascular: Denies chest pain, chest tightness, palpitations or swelling in the hands or feet.  Gastrointestinal: Denies abdominal pain, bloating, constipation, diarrhea or blood in the stool.  GU: Denies urgency, frequency, pain with urination, burning sensation, blood in urine, odor or discharge. Musculoskeletal: Denies decrease in range of motion, difficulty with gait, muscle pain or joint pain and swelling.  Skin: Denies redness, rashes,  lesions or ulcercations.  Neurological: Denies dizziness, difficulty with memory, difficulty with speech or problems with balance and coordination.  Psych: Denies anxiety, depression, SI/HI.  No other specific complaints in a complete review of systems (except as listed in HPI above).   Objective:    Vision:  Uncorrected Corrected Horizontal Field of Vision  Right Eye 20/20  70 degrees  Left Eye  20/20  70 degrees  Both Eyes  20/20     Applicant can recognize and distinguish among traffic control signals and devices showing standard red, green, and amber colors.   Monocular Vision?:  No   Hearing:    Whisper Test:  Right Ear: > 5 feet  Left Ear: > 41ffeet   No hearing aid requirement BP 132/88 (BP Location: Left Arm, Patient Position: Sitting, Cuff Size: Large)   Pulse 91   Ht 6' (1.829 m)   Wt 270 lb (122.5 kg)   SpO2 98%   BMI 36.62 kg/m     Wt Readings from Last 3 Encounters:  05/19/24 270 lb 6 oz (122.6 kg)  05/19/24 270 lb 6 oz (122.6 kg)  12/03/23 274 lb (124.3 kg)    General: Appears his stated age, obese, in NAD. HEENT: Head: normal shape and size; Eyes: sclera white, no icterus, conjunctiva pink, PERRLA and EOMs intact; Ears: Tm's gray and intact, normal light reflex;  Neck: Neck supple, trachea midline.  No masses lumps or thyromegaly noted. Cardiovascular: Normal rate and rhythm. S1,S2 noted.  No murmur, rubs or gallops noted. No JVD or BLE edema.  No carotid bruits noted. Pulmonary/Chest: Normal effort and positive vesicular breath sounds. No respiratory distress. No wheezes, rales or ronchi noted.  Abdomen: Soft and nontender. Normal bowel sounds. No distention or masses noted.  Musculoskeletal: Normal range of motion. Strength 5/5 BUE/BLE. No difficulty with gait.  Neurological: Alert and oriented. Cranial nerves II-XII grossly intact. Coordination normal.  Psychiatric: Mood and affect normal. Behavior is normal. Judgment and thought content normal.     BMET    Component Value Date/Time   NA 140 05/19/2024 0849   NA 139 03/27/2021 0816   NA 135 (L) 11/04/2013 1046   K 4.1 05/19/2024 0849   K 3.8 11/04/2013 1046   CL 103 05/19/2024 0849   CL 103 11/04/2013 1046   CO2 29 05/19/2024 0849   CO2 25 11/04/2013 1046   GLUCOSE 110 (H) 05/19/2024 0849   GLUCOSE 90 11/04/2013 1046   BUN 16 05/19/2024 0849   BUN 16 03/27/2021 0816   BUN 12 11/04/2013 1046   CREATININE 1.08 05/19/2024 0849   CALCIUM  9.8 05/19/2024 0849   CALCIUM  9.1 11/04/2013 1046   GFRNONAA >60 11/01/2022 0851   GFRNONAA 88 01/20/2020 0755   GFRAA 102 01/20/2020 0755    Lipid Panel     Component Value Date/Time   CHOL 200 (H) 05/19/2024 0849   CHOL 178 06/05/2021 0816   TRIG 82 05/19/2024 0849   HDL 45 05/19/2024 0849   HDL 39 (L) 06/05/2021 0816   CHOLHDL 4.4 05/19/2024 0849   VLDL 19 11/01/2022 0851   LDLCALC 137 (H) 05/19/2024 0849    CBC    Component Value Date/Time   WBC 4.3 05/19/2024 0849   RBC 5.75 05/19/2024 0849   HGB 17.9 (H) 05/19/2024 0849   HGB 17.1 08/10/2015 1420   HCT 54.1 (H) 05/19/2024 0849   HCT 49.3 08/10/2015 1420   PLT 310 05/19/2024 0849   PLT 354 08/10/2015 1420   MCV 94.1 05/19/2024 0849   MCV 91 08/10/2015 1420   MCV 95 11/04/2013 1046   MCH 31.1 05/19/2024 0849   MCHC 33.1 05/19/2024 0849   RDW 12.6 05/19/2024 0849   RDW 13.9 08/10/2015 1420   RDW 13.6 11/04/2013 1046   LYMPHSABS 1,337 01/20/2020 0755   LYMPHSABS 1.7 08/10/2015 1420   MONOABS 0.5 02/13/2016 0729   EOSABS 60 05/19/2024 0849   EOSABS 0.1 08/10/2015 1420   BASOSABS 30 05/19/2024 0849   BASOSABS 0.0 08/10/2015 1420    Hgb A1C Lab Results  Component Value Date   HGBA1C 5.7 (H)  05/19/2024      Labs:  Urinalysis:  Specific Gravity: 1.030  Protein: Neg  Glucose: Neg  Blood: Neg  Assessment:    Healthy male exam.  Meets standards, but periodic monitoring required due to DM 2.  Driver qualified only for 1 year.    Plan:    Medical  examiners certificate completed and printed. Return as needed.    Angeline Laura, NP

## 2024-05-21 ENCOUNTER — Ambulatory Visit: Payer: Self-pay | Admitting: Family Medicine

## 2024-11-17 ENCOUNTER — Ambulatory Visit: Admitting: Family Medicine
